# Patient Record
Sex: Male | Born: 1955
Health system: Southern US, Community
[De-identification: ages and names within clinical notes are randomized; demographics above are authoritative.]

## PROBLEM LIST (undated history)

## (undated) DIAGNOSIS — K6389 Other specified diseases of intestine: Secondary | ICD-10-CM

## (undated) DIAGNOSIS — Z789 Other specified health status: Secondary | ICD-10-CM

## (undated) DIAGNOSIS — I10 Essential (primary) hypertension: Secondary | ICD-10-CM

## (undated) DIAGNOSIS — J45909 Unspecified asthma, uncomplicated: Secondary | ICD-10-CM

## (undated) DIAGNOSIS — T4145XA Adverse effect of unspecified anesthetic, initial encounter: Secondary | ICD-10-CM

## (undated) DIAGNOSIS — I739 Peripheral vascular disease, unspecified: Secondary | ICD-10-CM

## (undated) DIAGNOSIS — J42 Unspecified chronic bronchitis: Secondary | ICD-10-CM

## (undated) DIAGNOSIS — F32A Depression, unspecified: Secondary | ICD-10-CM

## (undated) DIAGNOSIS — K5792 Diverticulitis of intestine, part unspecified, without perforation or abscess without bleeding: Secondary | ICD-10-CM

## (undated) DIAGNOSIS — F329 Major depressive disorder, single episode, unspecified: Secondary | ICD-10-CM

## (undated) DIAGNOSIS — K635 Polyp of colon: Secondary | ICD-10-CM

## (undated) DIAGNOSIS — C649 Malignant neoplasm of unspecified kidney, except renal pelvis: Secondary | ICD-10-CM

## (undated) DIAGNOSIS — N289 Disorder of kidney and ureter, unspecified: Secondary | ICD-10-CM

## (undated) DIAGNOSIS — M1990 Unspecified osteoarthritis, unspecified site: Secondary | ICD-10-CM

## (undated) DIAGNOSIS — M899 Disorder of bone, unspecified: Secondary | ICD-10-CM

## (undated) DIAGNOSIS — T8859XA Other complications of anesthesia, initial encounter: Secondary | ICD-10-CM

## (undated) DIAGNOSIS — K529 Noninfective gastroenteritis and colitis, unspecified: Secondary | ICD-10-CM

## (undated) DIAGNOSIS — IMO0001 Reserved for inherently not codable concepts without codable children: Secondary | ICD-10-CM

## (undated) DIAGNOSIS — N529 Male erectile dysfunction, unspecified: Secondary | ICD-10-CM

## (undated) DIAGNOSIS — M898X9 Other specified disorders of bone, unspecified site: Secondary | ICD-10-CM

## (undated) DIAGNOSIS — F419 Anxiety disorder, unspecified: Secondary | ICD-10-CM

## (undated) DIAGNOSIS — M359 Systemic involvement of connective tissue, unspecified: Secondary | ICD-10-CM

## (undated) DIAGNOSIS — J309 Allergic rhinitis, unspecified: Secondary | ICD-10-CM

## (undated) DIAGNOSIS — R590 Localized enlarged lymph nodes: Secondary | ICD-10-CM

## (undated) DIAGNOSIS — A6000 Herpesviral infection of urogenital system, unspecified: Secondary | ICD-10-CM

## (undated) HISTORY — DX: Allergic rhinitis, unspecified: J30.9

## (undated) HISTORY — DX: Other specified disorders of bone, unspecified site: M89.8X9

## (undated) HISTORY — DX: Malignant neoplasm of unspecified kidney, except renal pelvis: C64.9

## (undated) HISTORY — DX: Disorder of kidney and ureter, unspecified: N28.9

## (undated) HISTORY — DX: Diverticulitis of intestine, part unspecified, without perforation or abscess without bleeding: K57.92

## (undated) HISTORY — DX: Unspecified chronic bronchitis: J42

## (undated) HISTORY — DX: Localized enlarged lymph nodes: R59.0

## (undated) HISTORY — DX: Major depressive disorder, single episode, unspecified: F32.9

## (undated) HISTORY — PX: COLON SURGERY: SHX602

## (undated) HISTORY — DX: Polyp of colon: K63.5

## (undated) HISTORY — DX: Male erectile dysfunction, unspecified: N52.9

## (undated) HISTORY — DX: Anxiety disorder, unspecified: F41.9

## (undated) HISTORY — DX: Disorder of bone, unspecified: M89.9

## (undated) HISTORY — DX: Other specified diseases of intestine: K63.89

## (undated) HISTORY — DX: Herpesviral infection of urogenital system, unspecified: A60.00

## (undated) HISTORY — DX: Depression, unspecified: F32.A

## (undated) HISTORY — DX: Noninfective gastroenteritis and colitis, unspecified: K52.9

## (undated) HISTORY — DX: Unspecified osteoarthritis, unspecified site: M19.90

---
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
## 1998-02-24 HISTORY — PX: VASECTOMY: SHX75

## 2004-07-01 ENCOUNTER — Emergency Department: Payer: Self-pay | Admitting: Emergency Medicine

## 2007-10-20 ENCOUNTER — Ambulatory Visit: Payer: Self-pay | Admitting: Endocrinology

## 2008-03-26 ENCOUNTER — Emergency Department: Payer: Self-pay | Admitting: Emergency Medicine

## 2009-03-12 ENCOUNTER — Ambulatory Visit: Payer: Self-pay

## 2009-05-11 ENCOUNTER — Ambulatory Visit: Payer: Self-pay | Admitting: General Practice

## 2009-08-09 ENCOUNTER — Ambulatory Visit: Payer: Self-pay | Admitting: Unknown Physician Specialty

## 2009-08-17 ENCOUNTER — Ambulatory Visit: Payer: Self-pay | Admitting: Unknown Physician Specialty

## 2010-02-24 HISTORY — PX: KIDNEY SURGERY: SHX687

## 2010-02-24 HISTORY — PX: PARTIAL COLECTOMY: SHX5273

## 2010-04-30 ENCOUNTER — Inpatient Hospital Stay: Payer: Self-pay | Admitting: Surgery

## 2010-06-08 ENCOUNTER — Emergency Department: Payer: Self-pay | Admitting: Emergency Medicine

## 2010-06-19 ENCOUNTER — Ambulatory Visit: Payer: Self-pay | Admitting: Surgery

## 2010-06-26 ENCOUNTER — Inpatient Hospital Stay: Payer: Self-pay | Admitting: Surgery

## 2010-07-02 LAB — PATHOLOGY REPORT

## 2010-07-10 ENCOUNTER — Ambulatory Visit: Payer: Self-pay | Admitting: Internal Medicine

## 2010-07-26 ENCOUNTER — Ambulatory Visit: Payer: Self-pay | Admitting: Internal Medicine

## 2010-10-03 ENCOUNTER — Ambulatory Visit: Payer: Self-pay | Admitting: Urology

## 2011-05-13 ENCOUNTER — Emergency Department: Payer: Self-pay | Admitting: Emergency Medicine

## 2011-05-13 LAB — CBC
HCT: 41.8 % (ref 40.0–52.0)
HGB: 13.8 g/dL (ref 13.0–18.0)
MCH: 30.9 pg (ref 26.0–34.0)
MCV: 94 fL (ref 80–100)
RBC: 4.45 10*6/uL (ref 4.40–5.90)
WBC: 9.9 10*3/uL (ref 3.8–10.6)

## 2011-05-13 LAB — COMPREHENSIVE METABOLIC PANEL
BUN: 19 mg/dL — ABNORMAL HIGH (ref 7–18)
Bilirubin,Total: 0.3 mg/dL (ref 0.2–1.0)
Calcium, Total: 9 mg/dL (ref 8.5–10.1)
Chloride: 104 mmol/L (ref 98–107)
Co2: 26 mmol/L (ref 21–32)
EGFR (African American): >60
EGFR (Non-African Amer.): >60
Glucose: 81 mg/dL (ref 65–99)
Osmolality: 284 (ref 275–301)
Potassium: 4 mmol/L (ref 3.5–5.1)
SGPT (ALT): 18 U/L
Total Protein: 7.5 g/dL (ref 6.4–8.2)

## 2011-05-13 LAB — URINALYSIS, COMPLETE
Bilirubin,UR: NEGATIVE
Ph: 6 (ref 4.5–8.0)
RBC,UR: 1 /HPF (ref 0–5)
Squamous Epithelial: NONE SEEN
WBC UR: 1 /HPF (ref 0–5)

## 2011-05-13 LAB — PROTIME-INR
INR: 1
Prothrombin Time: 13.6 secs (ref 11.5–14.7)

## 2011-05-13 LAB — AMYLASE: Amylase: 46 U/L (ref 25–115)

## 2012-12-06 ENCOUNTER — Inpatient Hospital Stay: Payer: Self-pay | Admitting: Internal Medicine

## 2012-12-06 LAB — URINALYSIS, COMPLETE
Bacteria: NONE SEEN
Bilirubin,UR: NEGATIVE
Ketone: NEGATIVE
Leukocyte Esterase: NEGATIVE
Nitrite: NEGATIVE
Ph: 6 (ref 4.5–8.0)
Protein: 100
Specific Gravity: 1.026 (ref 1.003–1.030)
WBC UR: 2 /HPF (ref 0–5)

## 2012-12-06 LAB — CK: CK, Total: 93 U/L (ref 35–232)

## 2012-12-06 LAB — COMPREHENSIVE METABOLIC PANEL
Albumin: 3.5 g/dL (ref 3.4–5.0)
Alkaline Phosphatase: 73 U/L (ref 50–136)
BUN: 12 mg/dL (ref 7–18)
Chloride: 103 mmol/L (ref 98–107)
Creatinine: 1.24 mg/dL (ref 0.60–1.30)
Osmolality: 272 (ref 275–301)
Potassium: 3.8 mmol/L (ref 3.5–5.1)
SGPT (ALT): 15 U/L (ref 12–78)
Total Protein: 6.9 g/dL (ref 6.4–8.2)

## 2012-12-06 LAB — CBC
MCH: 32.8 pg (ref 26.0–34.0)
MCHC: 33.8 g/dL (ref 32.0–36.0)
MCV: 97 fL (ref 80–100)
Platelet: 244 10*3/uL (ref 150–440)
RBC: 4.81 10*6/uL (ref 4.40–5.90)
RDW: 14.6 % — ABNORMAL HIGH (ref 11.5–14.5)
WBC: 16.2 10*3/uL — ABNORMAL HIGH (ref 3.8–10.6)

## 2012-12-06 LAB — LIPASE, BLOOD: Lipase: 135 U/L (ref 73–393)

## 2012-12-06 LAB — CLOSTRIDIUM DIFFICILE BY PCR

## 2012-12-07 LAB — CBC WITH DIFFERENTIAL/PLATELET
Basophil #: 0.1 10*3/uL (ref 0.0–0.1)
Eosinophil #: 0.3 10*3/uL (ref 0.0–0.7)
Eosinophil %: 2 %
Lymphocyte #: 2.1 10*3/uL (ref 1.0–3.6)
MCH: 33.1 pg (ref 26.0–34.0)
MCV: 99 fL (ref 80–100)
Monocyte %: 7.7 %
Neutrophil #: 11.4 10*3/uL — ABNORMAL HIGH (ref 1.4–6.5)
Neutrophil %: 76.1 %
RDW: 14.6 % — ABNORMAL HIGH (ref 11.5–14.5)
WBC: 14.9 10*3/uL — ABNORMAL HIGH (ref 3.8–10.6)

## 2012-12-08 LAB — BASIC METABOLIC PANEL
BUN: 6 mg/dL — ABNORMAL LOW (ref 7–18)
Chloride: 106 mmol/L (ref 98–107)
Co2: 24 mmol/L (ref 21–32)
Creatinine: 1.11 mg/dL (ref 0.60–1.30)
EGFR (African American): >60
Osmolality: 270 (ref 275–301)
Sodium: 137 mmol/L (ref 136–145)

## 2012-12-08 LAB — CBC WITH DIFFERENTIAL/PLATELET
Basophil #: 0.1 10*3/uL (ref 0.0–0.1)
Basophil %: 0.9 %
Eosinophil #: 0.3 10*3/uL (ref 0.0–0.7)
HCT: 39.2 % — ABNORMAL LOW (ref 40.0–52.0)
MCHC: 33.6 g/dL (ref 32.0–36.0)
MCV: 98 fL (ref 80–100)
Neutrophil %: 66.6 %
Platelet: 189 10*3/uL (ref 150–440)
RDW: 14.1 % (ref 11.5–14.5)

## 2014-06-16 NOTE — Discharge Summary (Signed)
PATIENT NAME:  Thomas Le, VANDERWALL MR#:  [id number] DATE OF BIRTH:  [birth year]  DATE OF ADMISSION:  12/06/2012 DATE OF DISCHARGE:  12/08/2012   ADMITTING PHYSICIAN: Srikar R. Sudini, MD  DISCHARGING PHYSICIAN: Gladstone Lighter, MD  PRIMARY CARE PHYSICIAN: None.   CHIEF COMPLAINT: Abdominal pain and bloody diarrhea.   DISCHARGE DIAGNOSES:  1. Acute colitis involving the transverse colon and descending colon.  2. Tobacco use disorder.  3. History of perforated diverticulitis, status post colon resection.  4. History of renal cell carcinoma, status post right nephrectomy.   DISCHARGE HOME MEDICATIONS:  1. Flagyl 500 mg p.o. q.8 hours for 8 more days.  2. Ciprofloxacin 500 mg p.o. b.i.d. for 8 more days.  3. Percocet 10/325 mg 1 tablet q.6 hours p.r.n. for pain.   DISCHARGE DIET: Regular and low-residue diet.   DISCHARGE ACTIVITY: As tolerated.   FOLLOWUP INSTRUCTIONS: Follow up with physician in 2 weeks.   LABS AND IMAGING STUDIES PRIOR TO DISCHARGE:  WBC is 10.3, hemoglobin 13.3, hematocrit 39.2, platelet count 189. Sodium 137, potassium 4.0, chloride 106, bicarbonate 24, BUN 6, creatinine 1.1, glucose 76 and calcium of 8.1.  Stool for C. difficile is negative.  CT of the abdomen and pelvis with contrast showing transverse and descending colon findings consistent with colitis, could be infectious or inflammatory. No perforation or abscess noted. No evidence of obstruction. Multiple liver cysts are seen. Right kidney is surgically absent, and left kidney is normal. Left adrenal gland is enlarged, and there is a small right adrenal mass. No evidence of any ascites seen.  Chest x-ray revealing clear lung fields. No acute abnormality.  Urinalysis negative for any infection.  Lipase was normal on admission.   BRIEF HOSPITAL COURSE: Mr. Schara is a 59 year old male with no significant past medical history other than history of perforated diverticulitis, for which he had a partial colon  resection, and at the same time, incidentally was noted to have a right renal mass consistent with possible renal cell carcinoma and had right nephrectomy done during the same procedure. The patient has episodes of chronic diarrhea occasionally since his surgery; however, he started noticing intense lower abdominal pain and bloody diarrhea and presented to the ER. CT of the abdomen here revealed colitis of the transverse and descending colon, so was admitted for the same.   1. Acute colitis. Was initially n.p.o., and then his diet was slowly advanced to the liquids. He did receive IV pain medication and was on IV fluids as well. Started on Cipro and Flagyl. His bloody diarrhea has resolved, and currently he only has a few episodes of regular chronic diarrhea. Stool studies have been negative for C. difficile infection. He was tolerating his diet, so was advanced to a regular diet and is being discharged home on p.o. Cipro and Flagyl antibiotics.  2. Tobacco use disorder. Was counseled on admission. 3. His course has been otherwise uneventful in the hospital.   DISCHARGE CONDITION: Stable.   DISCHARGE DISPOSITION: Home.   TIME SPENT ON DISCHARGE: 45 minutes.   ____________________________ Gladstone Lighter, MD rk:lb D: 12/08/2012 07:56:42 ET T: 12/08/2012 08:07:02 ET JOB#: [id number]  cc: Gladstone Lighter, MD, <Dictator> Gladstone Lighter MD ELECTRONICALLY SIGNED 12/11/2012 12:07

## 2014-06-16 NOTE — H&P (Signed)
PATIENT NAME:  Thomas, Le MR#:  [id number] DATE OF BIRTH:  [birth year]  DATE OF ADMISSION:  12/06/2012  PRIMARY CARE PHYSICIAN: None.   CHIEF COMPLAINT: Abdominal pain and bloody diarrhea.   HISTORY OF PRESENTING ILLNESS: A 59 year old male patient with history of perforated diverticulitis, status post colon resection and renal cell carcinoma, who presents to the Emergency Room complaining of 3 days of nausea, vomiting, abdominal pain and bloody diarrhea. The patient had symptoms and started feeling weak with some abdominal discomfort, nausea, vomiting and diarrhea. The patient later noticed gross bloody stools. The last bowel movement he had was earlier today, which had some clots. His nausea and vomiting had resolved, but he has received multiple doses of pain medications secondary to his abdominal pain. The patient is afebrile. White count is elevated at 16. CT scan of the abdomen showing transverse and descending colon colitis and the patient is being admitted to the hospitalist service.   The patient did not use any recent antibiotics or steroids. No history of inflammatory bowel disease. The patient, in 2012, was having a screening colonoscopy and a different colonoscopy after one month of this procedure and the patient had a perforated diverticulitis and had to have surgery.   He was also diagnosed with renal cell carcinoma. Presently, he is being followed by imaging studies after nephrectomy during the same surgery as colon cancer.   PAST MEDICAL HISTORY:  1.  Perforated diverticulitis, status post colon resection.  2.  Renal cell carcinoma., status post nephrectomy.   FAMILY HISTORY: No family history of colon cancer, renal cell carcinoma or inflammatory bowel disease.   SOCIAL HISTORY: The patient smokes a pack a day. Drinks 4 to 5 days out of the week, but does not have any withdrawal symptoms.   ALLERGIES: No known drug allergies.   HOME MEDICATIONS: Ibuprofen p.r.n. maybe once  a week.   REVIEW OF SYSTEMS: CONSTITUTIONAL: No fever. Does have fatigue. EYES: No blurred vision, pain, redness. EARS, NOSE, THROAT: No tinnitus, ear pain, hearing loss.  RESPIRATORY: No cough, wheeze, hemoptysis. CARDIOVASCULAR: No chest pain, orthopnea, edema. GASTROINTESTINAL: Has nausea, vomiting and diarrhea, which is bloody.  GENITOURINARY: No dysuria or hematuria. Does have decreased urination. ENDOCRINE: No polyuria, nocturia, thyroid problems. HEMATOLOGIC AND LYMPHATIC: No anemia, easy bruising or bleeding. INTEGUMENTARY: No acne, rash, lesions. MUSCULOSKELETAL: No myalgias or arthralgias. NEUROLOGIC: No focal numbness, weakness, seizure. PSYCHIATRIC: No anxiety or depression.   PHYSICAL EXAMINATION:  VITAL SIGNS: Temperature 97.7, pulse 61, blood pressure 142/63, saturating 97% on room.  GENERAL: Moderately built, Caucasian male patient lying in bed in mild distress secondary to his abdominal pain.  PSYCHIATRIC: Alert and oriented x 3. Mood and affect appropriate. Judgment intact.  HEENT: Atraumatic, normocephalic. Oral mucosa dry and pink. No oral ulcers or thrush. External ears and nose normal. No pallor. No icterus.  NECK: Supple. No thyromegaly. No palpable lymph nodes. Trachea midline. No carotid bruit or JVD.  CARDIOVASCULAR: S1, S2, without any murmurs. Peripheral pulses 2+. No edema.  RESPIRATORY: Normal work of breathing. Clear to auscultation on both sides.  GASTROINTESTINAL: Soft abdomen. Tenderness diffusely. No rigidity or guarding. Scars from prior surgeries seen. No hepatosplenomegaly palpable.  GENITOURINARY: No CVA tenderness or bladder distention.  SKIN: Warm and dry. No petechiae, rash, ulcers.  MUSCULOSKELETAL: No joint swelling, redness, effusion of the large joints. Normal muscle tone.  NEUROLOGICAL: Motor strength 5 over 5 in upper and lower extremities.  LYMPHATIC: No cervical lymphadenopathy.   LABORATORY STUDIES: Glucose of  103, BUN 12, creatinine 1.24,  sodium 136, potassium 3.8. AST, ALT, alkaline phosphatase, bilirubin normal. Troponin less than 0.02. WBC 16.2, hemoglobin 15.8, MCV of 97 with platelets 244. Urinalysis shows no bacteria. EKG shows sinus bradycardia. No acute ST-T wave changes. CT scan of the abdomen and pelvis with contrast shows colitis of transverse and descending colon. No evidence of perforation or abscess, liver cyst, right kidney surgically absent. Left adrenal enlargement, same as prior CT from 2013.   ASSESSMENT AND PLAN:  1.  Acute colitis involving both the transverse and descending colon. The patient has had bloody diarrhea. This is likely secondary to the colitis. The patient will be started on IV antibiotics of Cipro and Flagyl. Clear liquid diet. Use pain medications as needed. The patient has had complications with colonoscopy in the past. We will not put the patient through the colonoscopy again for followup. We will have to see how the patient does on IV antibiotics. The patient has significant dehydration. We will also aggressively IV fluid resuscitate him. I expect his hemoglobin to drop with IV fluids. He likely has hemoconcentration showing a normal hemoglobin at this time.  2.  Elevated blood pressure without diagnosis of hypertension, likely secondary from his abdominal pain. No medications needed at this time.  3.  Tobacco abuse. I have counseled the patient to quit smoking for greater than 3 minutes.  4.  Deep vein thrombosis prophylaxis with sequential compression devices. No heparin products.   CODE STATUS: Full code.  TIME SPENT TODAY ON THIS CASE: 50 minutes.   ____________________________ Molinda Bailiff Audyn Dimercurio, MD srs:aw D: 12/06/2012 14:49:22 ET T: 12/06/2012 15:02:39 ET JOB#: [id number]  cc: Wardell Heath R. Taj Nevins, MD, <Dictator> Orie Fisherman MD ELECTRONICALLY SIGNED 12/19/2012 22:40

## 2015-05-14 ENCOUNTER — Ambulatory Visit (INDEPENDENT_AMBULATORY_CARE_PROVIDER_SITE_OTHER): Payer: BLUE CROSS/BLUE SHIELD | Admitting: Family Medicine

## 2015-05-14 ENCOUNTER — Encounter: Payer: Self-pay | Admitting: Family Medicine

## 2015-05-14 VITALS — BP 104/62 | HR 74 | Temp 98.3°F | Ht 74.0 in | Wt 166.6 lb

## 2015-05-14 DIAGNOSIS — F329 Major depressive disorder, single episode, unspecified: Secondary | ICD-10-CM | POA: Insufficient documentation

## 2015-05-14 DIAGNOSIS — R197 Diarrhea, unspecified: Secondary | ICD-10-CM | POA: Insufficient documentation

## 2015-05-14 DIAGNOSIS — M79641 Pain in right hand: Secondary | ICD-10-CM | POA: Insufficient documentation

## 2015-05-14 DIAGNOSIS — M25449 Effusion, unspecified hand: Secondary | ICD-10-CM | POA: Diagnosis not present

## 2015-05-14 DIAGNOSIS — Z130 Encounter for screening for diseases of the blood and blood-forming organs and certain disorders involving the immune mechanism: Secondary | ICD-10-CM

## 2015-05-14 DIAGNOSIS — M79642 Pain in left hand: Secondary | ICD-10-CM

## 2015-05-14 DIAGNOSIS — F32A Depression, unspecified: Secondary | ICD-10-CM | POA: Insufficient documentation

## 2015-05-14 DIAGNOSIS — F418 Other specified anxiety disorders: Secondary | ICD-10-CM | POA: Diagnosis not present

## 2015-05-14 DIAGNOSIS — I731 Thromboangiitis obliterans [Buerger's disease]: Secondary | ICD-10-CM

## 2015-05-14 DIAGNOSIS — Z13220 Encounter for screening for lipoid disorders: Secondary | ICD-10-CM

## 2015-05-14 DIAGNOSIS — M349 Systemic sclerosis, unspecified: Secondary | ICD-10-CM | POA: Insufficient documentation

## 2015-05-14 DIAGNOSIS — F419 Anxiety disorder, unspecified: Secondary | ICD-10-CM

## 2015-05-14 NOTE — Patient Instructions (Signed)
Nice to meet you. We will obtain lab work at a later date when you been fasting to evaluate screening labs and for arthritis. Please consider BuSpar for your anxiety. You need to quit smoking at this time to help with your hands. If you develop thoughts of harming herself, and pain, numbness or tingling in her hands, coldness in her hands, or any new or changing symptoms please seek medical attention.

## 2015-05-14 NOTE — Assessment & Plan Note (Signed)
Untreated at this time. Patient complains rather significantly of this issue though his PHQ 9 and GAD 7 scoring are quite low. He is inappropriately taking his wife's Xanax intermittently at this time. Discussed that he should not do this. Discussed possible treatments including SSRIs which he declined and BuSpar. He stated he would check with his wife and discuss BuSpar with her prior to determining if he would start it. He will continue to monitor. If worsens he will let us know. He is given return precautions.

## 2015-05-14 NOTE — Progress Notes (Signed)
Patient ID: [name], male   DOB: 05-23-1955, 60 y.o.   MRN: [id number]  [name], MD Phone: [phone]  Thomas Le is a 60 y.o. male who presents today for new patient visit.  Depression/anxiety: Patient notes significant stress at home with his daughter being pregnant and his son being depressed. Patient notes he's had issues with depression and anxiety off and on since he was a child. Had lots of significant life changes at once while he was a kid. Notes he has never really enjoyed going to school or work. Has had issues with sleeping throughout his life. 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 Notes he is currently intermittently taking his wife's Xanax if he has a particularly bad day. No SI.  Patient additionally reports concerns for arthritis in his hands. He has a family history rheumatoid arthritis. He notes MCP joint swelling particularly in the second third and fourth MCP joints.  He also reports he has buergers disease. He was diagnosed at [facility] in 1999. States this is related to decreased blood flow to his fingers due to smoking. He continues to smoke. Notes occasionally his fingers will turn blue and become mildly cold though this will resolve if he warms them up.  Patient additionally notes intermittent diarrhea depending on what he eats since he had colon surgery for colon perforation. Notes if he eats spicy foods or drinks beer he will have diarrhea. Typically takes Imodium and this is beneficial.  Active Ambulatory Problems    Diagnosis Date Noted  . Anxiety and depression 05/14/2015  . Bilateral hand pain 05/14/2015  . Buerger's disease (HCC) 05/14/2015  . Diarrhea 05/14/2015   Resolved Ambulatory Problems    Diagnosis Date Noted  . No Resolved Ambulatory Problems   Past Medical History  Diagnosis Date  . Arthritis   . Colitis   . Renal cancer (HCC)   . Diverticulitis   . Chronic bronchitis  (HCC)   . Allergic rhinitis   . Kidney disease   . Genital herpes   . Colon polyps     Family History  Problem Relation Age of Onset  . Arthritis/Rheumatoid      Parent  . Heart disease      Grandparent  . Alcoholism      Other relative    Social History   Social History  . Marital Status: Married    Spouse Name: N/A  . Number of Children: N/A  . Years of Education: N/A   Occupational History  . Not on file.   Social History Main Topics  . Smoking status: Current Every Day Smoker  . Smokeless tobacco: Not on file  . Alcohol Use: 0.0 oz/week    0 Standard drinks or equivalent per week     Comment: 4 beers a night   . Drug Use: Yes  . Sexual Activity: Not on file   Other Topics Concern  . Not on file   Social History Narrative  . No narrative on file    ROS   General:  Negative for nexplained weight loss, fever Skin: Negative for new or changing mole, sore that won't heal HEENT: Negative for trouble hearing, trouble seeing, ringing in ears, mouth sores, hoarseness, change in voice, dysphagia. CV:  Negative for chest pain, dyspnea, edema, palpitations Resp: Negative for cough, dyspnea, hemoptysis GI: Positive for diarrhea, Negative for nausea, vomiting, constipation, abdominal pain, melena, hematochezia. GU: Positive for sexual difficulty,  Negative for dysuria, incontinence, urinary hesitance, hematuria, vaginal or penile discharge, polyuria, lumps in testicle or breasts MSK: Negative for muscle cramps or aches, joint pain or swelling Neuro: Negative for headaches, weakness, numbness, dizziness, passing out/fainting Psych: Positive for depression, anxiety, negative for memory problems  Objective  Physical Exam Filed Vitals:   05/14/15 1422  BP: 104/62  Pulse: 74  Temp: 98.3 F (36.8 C)    BP Readings from Last 3 Encounters:  05/14/15 104/62   Wt Readings from Last 3 Encounters:  05/14/15 166 lb 9.6 oz (75.569 kg)    Physical Exam    Constitutional: He is well-developed, well-nourished, and in no distress.  HENT:  Head: Normocephalic and atraumatic.  Right Ear: External ear normal.  Left Ear: External ear normal.  Mouth/Throat: Oropharynx is clear and moist. No oropharyngeal exudate.  Eyes: Pupils are equal, round, and reactive to light.  Neck: Neck supple.  Cardiovascular: Normal rate, regular rhythm and normal heart sounds.  Exam reveals no gallop and no friction rub.   No murmur heard. Pulmonary/Chest: Effort normal and breath sounds normal. No respiratory distress. He has no wheezes. He has no rales.  Abdominal: Soft. Bowel sounds are normal. He exhibits no distension. There is no tenderness. There is no rebound and no guarding.  Musculoskeletal: He exhibits no edema.  Bilateral hands with swelling of the second third and fourth MCP joints with no erythema or warmth, no joint tenderness in the hands  Lymphadenopathy:    He has no cervical adenopathy.  Neurological: He is alert.  5/5 strength in bilateral biceps, triceps, grip, quads, hamstrings, plantar and dorsiflexion, sensation to light touch intact in bilateral UE and LE, normal gait, 2+ patellar reflexes  Skin: Skin is dry. He is not diaphoretic.  Bilateral fingers are cold though have capillary refill less than 2 seconds, on second and third check of hands throughout the exam there are varying degrees of increased warmth, mild purplish you to his fingers, 2+ radial pulses bilaterally, no skin ulcerations on the hands, sensation to light touch intact in bilateral hands, bilateral feet are warm and of normal color with 2+ DP pulses, sensation to light touch intact in bilateral feet  Psychiatric:  Mood anxious, affect anxious     Assessment/Plan:   Anxiety and depression Untreated at this time. Patient complains rather significantly of this issue though his PHQ 9 and GAD 7 scoring are quite low. He is inappropriately taking his wife's Xanax intermittently at  this time. Discussed that he should not do this. Discussed possible treatments including SSRIs which he declined and BuSpar. He stated he would check with his wife and discuss BuSpar with her prior to determining if he would start it. He will continue to monitor. If worsens he will let us know. He is given return precautions.  Bilateral hand pain Patient with MCP joint swelling in his bilateral hands with a family history of rheumatoid arthritis. We will check a rheumatoid factor and ANA to start workup for this. He'll continue to monitor.  Buerger's disease [facility]) Patient reports is a chronic issue since 1999. No recent change. His fingers do appear cold with a slight purplish tinge, though he has good capillary refill and no skin breakdown at this time. Distal pulses are intact as well. Given his history and his continued smoking I advised him to quit smoking. I also offered referral to vascular surgery for evaluation and possible further treatment though he was unsure of this and stated he needed  to check with his wife. He will continue to monitor. He is given return precautions.  Diarrhea Chronic intermittent infrequent issue based off of specific food intake. Benign abdominal exam. Advised to avoid foods that bring this on. Can continue Imodium. Given return precautions.    Orders Placed This Encounter  Procedures  . Lipid Profile    Standing Status: Future     Number of Occurrences:      Standing Expiration Date: 05/13/2016  . Comp Met (CMET)    Standing Status: Future     Number of Occurrences:      Standing Expiration Date: 05/13/2016  . CBC    Standing Status: Future     Number of Occurrences:      Standing Expiration Date: 05/13/2016  . Rheumatoid Factor    Standing Status: Future     Number of Occurrences:      Standing Expiration Date: 05/13/2016  . Antinuclear Antib (ANA)    Standing Status: Future     Number of Occurrences:      Standing Expiration Date: 05/13/2016  Patient  will also return for screening lab work.   [name], MD [facility]

## 2015-05-14 NOTE — Assessment & Plan Note (Signed)
Patient with MCP joint swelling in his bilateral hands with a family history of rheumatoid arthritis. We will check a rheumatoid factor and ANA to start workup for this. He'll continue to monitor.

## 2015-05-14 NOTE — Assessment & Plan Note (Signed)
Patient reports is a chronic issue since 1999. No recent change. His fingers do appear cold with a slight purplish tinge, though he has good capillary refill and no skin breakdown at this time. Distal pulses are intact as well. Given his history and his continued smoking I advised him to quit smoking. I also offered referral to vascular surgery for evaluation and possible further treatment though he was unsure of this and stated he needed to check with his wife. He will continue to monitor. He is given return precautions.

## 2015-05-14 NOTE — Progress Notes (Signed)
Pre visit review using our clinic review tool, if applicable. No additional management support is needed unless otherwise documented below in the visit note. 

## 2015-05-14 NOTE — Assessment & Plan Note (Signed)
Chronic intermittent infrequent issue based off of specific food intake. Benign abdominal exam. Advised to avoid foods that bring this on. Can continue Imodium. Given return precautions.

## 2015-06-12 ENCOUNTER — Other Ambulatory Visit (INDEPENDENT_AMBULATORY_CARE_PROVIDER_SITE_OTHER): Payer: BLUE CROSS/BLUE SHIELD

## 2015-06-12 DIAGNOSIS — Z130 Encounter for screening for diseases of the blood and blood-forming organs and certain disorders involving the immune mechanism: Secondary | ICD-10-CM

## 2015-06-12 DIAGNOSIS — Z13220 Encounter for screening for lipoid disorders: Secondary | ICD-10-CM

## 2015-06-12 DIAGNOSIS — M25449 Effusion, unspecified hand: Secondary | ICD-10-CM

## 2015-06-12 LAB — LIPID PANEL
CHOLESTEROL: 147 mg/dL (ref 0–200)
HDL: 62.8 mg/dL (ref >39.00)
LDL Cholesterol: 71 mg/dL (ref 0–99)
NonHDL: 84.09
Total CHOL/HDL Ratio: 2
Triglycerides: 64 mg/dL (ref 0.0–149.0)
VLDL: 12.8 mg/dL (ref 0.0–40.0)

## 2015-06-12 LAB — CBC
HCT: 46.3 % (ref 39.0–52.0)
Hemoglobin: 15.5 g/dL (ref 13.0–17.0)
MCHC: 33.5 g/dL (ref 30.0–36.0)
MCV: 98.6 fl (ref 78.0–100.0)
Platelets: 267 10*3/uL (ref 150.0–400.0)
RBC: 4.7 Mil/uL (ref 4.22–5.81)
RDW: 14.4 % (ref 11.5–15.5)
WBC: 8.7 10*3/uL (ref 4.0–10.5)

## 2015-06-12 LAB — COMPREHENSIVE METABOLIC PANEL
ALBUMIN: 4.1 g/dL (ref 3.5–5.2)
ALK PHOS: 56 U/L (ref 39–117)
ALT: 14 U/L (ref 0–53)
AST: 24 U/L (ref 0–37)
BILIRUBIN TOTAL: 0.7 mg/dL (ref 0.2–1.2)
BUN: 17 mg/dL (ref 6–23)
CO2: 28 mEq/L (ref 19–32)
CREATININE: 1.2 mg/dL (ref 0.40–1.50)
Calcium: 9.5 mg/dL (ref 8.4–10.5)
Chloride: 103 mEq/L (ref 96–112)
GFR: 65.43 mL/min (ref >60.00)
Glucose, Bld: 91 mg/dL (ref 70–99)
Potassium: 4.7 mEq/L (ref 3.5–5.1)
SODIUM: 137 meq/L (ref 135–145)
TOTAL PROTEIN: 7.1 g/dL (ref 6.0–8.3)

## 2015-06-13 LAB — ANTI-NUCLEAR AB-TITER (ANA TITER): ANA Titer 1: 1:1280 {titer} — ABNORMAL HIGH

## 2015-06-13 LAB — RHEUMATOID FACTOR: RHEUMATOID FACTOR: 17 [IU]/mL — AB (ref <=14)

## 2015-06-13 LAB — ANA: ANA: POSITIVE — AB

## 2015-06-14 ENCOUNTER — Encounter: Payer: Self-pay | Admitting: Family Medicine

## 2015-06-14 ENCOUNTER — Ambulatory Visit (INDEPENDENT_AMBULATORY_CARE_PROVIDER_SITE_OTHER): Payer: BLUE CROSS/BLUE SHIELD | Admitting: Family Medicine

## 2015-06-14 VITALS — BP 104/60 | HR 58 | Temp 98.3°F | Ht 74.0 in | Wt 162.6 lb

## 2015-06-14 DIAGNOSIS — M79642 Pain in left hand: Secondary | ICD-10-CM

## 2015-06-14 DIAGNOSIS — R599 Enlarged lymph nodes, unspecified: Secondary | ICD-10-CM | POA: Diagnosis not present

## 2015-06-14 DIAGNOSIS — R590 Localized enlarged lymph nodes: Secondary | ICD-10-CM | POA: Insufficient documentation

## 2015-06-14 DIAGNOSIS — F32A Depression, unspecified: Secondary | ICD-10-CM

## 2015-06-14 DIAGNOSIS — R768 Other specified abnormal immunological findings in serum: Secondary | ICD-10-CM

## 2015-06-14 DIAGNOSIS — F418 Other specified anxiety disorders: Secondary | ICD-10-CM | POA: Diagnosis not present

## 2015-06-14 DIAGNOSIS — M79641 Pain in right hand: Secondary | ICD-10-CM | POA: Diagnosis not present

## 2015-06-14 DIAGNOSIS — F419 Anxiety disorder, unspecified: Secondary | ICD-10-CM

## 2015-06-14 DIAGNOSIS — N529 Male erectile dysfunction, unspecified: Secondary | ICD-10-CM | POA: Insufficient documentation

## 2015-06-14 DIAGNOSIS — F329 Major depressive disorder, single episode, unspecified: Secondary | ICD-10-CM

## 2015-06-14 MED ORDER — SILDENAFIL CITRATE 50 MG PO TABS: 50.0000 mg | ORAL_TABLET | Freq: Every day | ORAL | Status: DC | PRN

## 2015-06-14 NOTE — Assessment & Plan Note (Signed)
Positive ANA with significantly elevated titer. Also with mildly elevated rheumatoid factor. Given joint swelling and lab findings we'll refer to rheumatology for further evaluation and workup for possible lupus and rheumatoid arthritis.

## 2015-06-14 NOTE — Assessment & Plan Note (Signed)
Chronic lesion, nontender, no signs of infection on his face or head. Given chronicity we will refer to ENT for further evaluation and consideration of biopsy.

## 2015-06-14 NOTE — Assessment & Plan Note (Signed)
Much improved. No SI or HI. Has not started BuSpar. He will continue to monitor. He will let us know if he decides to fill this medication. Given return precautions.

## 2015-06-14 NOTE — Progress Notes (Signed)
Pre visit review using our clinic review tool, if applicable. No additional management support is needed unless otherwise documented below in the visit note. 

## 2015-06-14 NOTE — Patient Instructions (Addendum)
Nice to see you. We are going to refer you to rheumatology for evaluation of her joint pain. Please consider medication for anxiety and depression and if you decide to do this please let us know. Please work on diet and exercise to reduce her risk of heart disease and stroke. One chair ready to quit smoking please let us know as well. Please try the Viagra for erectile dysfunction. If you develop chest pain, shortness of breath, or lightheadedness please seek medical attention. If he have to go to the emergency room or call EMS for any reason and you have taken the Viagra the last several days please let them know. We will refer you to ear nose and throat for the lump on the back of your head.  If you develop chest pain, shortness of breath, thoughts of harming herself or others, or any new or changing symptoms please seek medical attention.  Diet Recommendations  Starchy (carb) foods: Bread, rice, pasta, potatoes, corn, cereal, grits, crackers, bagels, muffins, all baked goods.  (Fruits, milk, and yogurt also have carbohydrate, but most of these foods will not spike your blood sugar as the starchy foods will.)  A few fruits do cause high blood sugars; use small portions of bananas (limit to 1/2 at a time), grapes, watermelon, oranges, and most tropical fruits.    Protein foods: Meat, fish, poultry, eggs, dairy foods, and beans such as pinto and kidney beans (beans also provide carbohydrate).   1. Eat at least 3 meals and 1-2 snacks per day. Never go more than 4-5 hours while awake without eating. Eat breakfast within the first hour of getting up.   2. Limit starchy foods to TWO per meal and ONE per snack. ONE portion of a starchy  food is equal to the following:   - ONE slice of bread (or its equivalent, such as half of a hamburger bun).   - 1/2 cup of a "scoopable" starchy food such as potatoes or rice.   - 15 grams of carbohydrate as shown on food label.  3. Include at every meal: a protein  food, a carb food, and vegetables and/or fruit.   - Obtain twice the volume of veg's as protein or carbohydrate foods for both lunch and dinner.   - Fresh or frozen veg's are best.   - Keep frozen veg's on hand for a quick vegetable serving.

## 2015-06-14 NOTE — Assessment & Plan Note (Signed)
Normal genital exam. No cardiac history and no cardiac symptoms. We will trial Viagra for this issue. Advised to inform any medical professional that he has taken this if he seeks emergent medical attention. Advised of potential for lightheadedness. Advised to seek medical attention for prolonged corrections.

## 2015-06-14 NOTE — Progress Notes (Signed)
Patient ID: [name], male   DOB: 01/03/1956, 60 y.o.   MRN: [id number]  Thomas Alar, MD Phone: [phone]  Thomas Le is a 60 y.o. male who presents today for same-day visit.  Joint pain: Patient notes this is unchanged from previously. Does note some swelling in the second third and fourth MCPs. He had positive ANA with quite elevated titer. Also mildly positive rheumatoid factor.  Anxiety and depression: Notes this is much improved. His housing situation is improved as his daughter and her boyfriend have moved out of the house. He notes no symptoms at this time. No SI or HI.  Erectile dysfunction: Patient notes he had no issues until 4-5 years ago. Notes he was placed on Cialis at that time and notes it worked too well. He would obtain a full erection for several hours though have a semierect penis for several days afterwards. Notes currently corrections are not as strong as typical. Ejaculates okay. No cardiac history. No chest pain or shortness of breath. He can physically do whatever he wants.  Discussed cardiac and stroke risk factor reduction given his ASCVD score of 6.3%. Discussed potential for treating with a cholesterol medicine versus altering diet and exercise. Also discussed quitting smoking. Patient does note he eats at home and does not eat out. He gets vegetables and fruits with most meals. No junk food. His job is quite physical though he does not do any specific exercise. He is unsure about quitting smoking at this time.  Patient also notes a nodule on the left occiput of his head that is been there for years. Notes it has not grown. There is no pain. It is freely mobile. He has no other lumps in his body.  PMH: Smoker   ROS see history of present illness  Objective  Physical Exam Filed Vitals:   06/14/15 0952  BP: 104/60  Pulse: 58  Temp: 98.3 F (36.8 C)    BP Readings from Last 3 Encounters:  06/14/15 104/60  05/14/15 104/62   Wt Readings  from Last 3 Encounters:  06/14/15 162 lb 9.6 oz (73.755 kg)  05/14/15 166 lb 9.6 oz (75.569 kg)    Physical Exam  Constitutional: He is well-developed, well-nourished, and in no distress.  HENT:  Head: Normocephalic and atraumatic.  Right Ear: External ear normal.  Left Ear: External ear normal.  Mouth/Throat: Oropharynx is clear and moist. No oropharyngeal exudate.  Normal TMs bilaterally  Cardiovascular: Normal rate, regular rhythm and normal heart sounds.   Pulmonary/Chest: Effort normal and breath sounds normal.  Genitourinary: Penis normal.  Normal scrotum and normal testicles, no penile discharge, no inguinal hernias, no inguinal lymphadenopathy  Musculoskeletal:  Bilateral hands with enlarged second third and fourth MCP joints with no tenderness, no other joint enlargement or tenderness in his hands  Neurological: He is alert.  Skin: Skin is warm and dry. He is not diaphoretic.  Left posterior occipital nodule about 1.5 cm in diameter freely mobile and nontender  Psychiatric: Mood and affect normal.     Assessment/Plan: Please see individual problem list.  Anxiety and depression Much improved. No SI or HI. Has not started BuSpar. He will continue to monitor. He will let us know if he decides to fill this medication. Given return precautions.  Bilateral hand pain Positive ANA with significantly elevated titer. Also with mildly elevated rheumatoid factor. Given joint swelling and lab findings we'll refer to rheumatology for further evaluation and workup for possible lupus and rheumatoid arthritis.  Erectile dysfunction Normal genital exam. No cardiac history and no cardiac symptoms. We will trial Viagra for this issue. Advised to inform any medical professional that he has taken this if he seeks emergent medical attention. Advised of potential for lightheadedness. Advised to seek medical attention for prolonged corrections.  Occipital lymphadenopathy Chronic lesion,  nontender, no signs of infection on his face or head. Given chronicity we will refer to ENT for further evaluation and consideration of biopsy.  Discussed diet and exercise. Advised to quit smoking. Offered support and these things though he declined quitting smoking at this time.  Orders Placed This Encounter  Procedures  . Ambulatory referral to ENT    Referral Priority:  Routine    Referral Type:  Consultation    Referral Reason:  Specialty Services Required    Requested Specialty:  Otolaryngology    Number of Visits Requested:  1  . Ambulatory referral to Rheumatology    Referral Priority:  Routine    Referral Type:  Consultation    Referral Reason:  Specialty Services Required    Requested Specialty:  Rheumatology    Number of Visits Requested:  1    Meds ordered this encounter  Medications  . sildenafil (VIAGRA) 50 MG tablet    Sig: Take 1 tablet (50 mg total) by mouth daily as needed for erectile dysfunction.    Dispense:  5 tablet    Refill:  0   Thomas Alar, MD [facility]

## 2015-06-29 ENCOUNTER — Other Ambulatory Visit: Payer: Self-pay | Admitting: Rheumatology

## 2015-06-29 DIAGNOSIS — R911 Solitary pulmonary nodule: Secondary | ICD-10-CM

## 2015-07-05 ENCOUNTER — Ambulatory Visit
Admission: RE | Admit: 2015-07-05 | Discharge: 2015-07-05 | Disposition: A | Discharge status: Home / Self Care | Payer: BLUE CROSS/BLUE SHIELD | Source: Ambulatory Visit | Attending: Rheumatology | Admitting: Rheumatology

## 2015-07-05 DIAGNOSIS — R911 Solitary pulmonary nodule: Secondary | ICD-10-CM | POA: Insufficient documentation

## 2015-07-09 ENCOUNTER — Other Ambulatory Visit: Payer: Self-pay | Admitting: Rheumatology

## 2015-07-09 DIAGNOSIS — R9389 Abnormal findings on diagnostic imaging of other specified body structures: Secondary | ICD-10-CM

## 2015-07-10 ENCOUNTER — Ambulatory Visit: Payer: Self-pay | Admitting: Cardiothoracic Surgery

## 2015-07-12 ENCOUNTER — Encounter
Admission: RE | Admit: 2015-07-12 | Discharge: 2015-07-12 | Disposition: A | Discharge status: Home / Self Care | Payer: BLUE CROSS/BLUE SHIELD | Source: Ambulatory Visit | Attending: Rheumatology | Admitting: Rheumatology

## 2015-07-12 DIAGNOSIS — R938 Abnormal findings on diagnostic imaging of other specified body structures: Secondary | ICD-10-CM | POA: Diagnosis present

## 2015-07-12 DIAGNOSIS — R9389 Abnormal findings on diagnostic imaging of other specified body structures: Secondary | ICD-10-CM

## 2015-07-12 LAB — GLUCOSE, CAPILLARY: GLUCOSE-CAPILLARY: 64 mg/dL — AB (ref 65–99)

## 2015-07-12 MED ORDER — FLUDEOXYGLUCOSE F - 18 (FDG) INJECTION
12.0000 | Freq: Once | INTRAVENOUS | Status: AC | PRN
  Administered 2015-07-12: 12.59 via INTRAVENOUS

## 2015-07-16 ENCOUNTER — Other Ambulatory Visit: Payer: Self-pay

## 2015-07-17 ENCOUNTER — Ambulatory Visit (INDEPENDENT_AMBULATORY_CARE_PROVIDER_SITE_OTHER): Payer: BLUE CROSS/BLUE SHIELD | Admitting: Cardiothoracic Surgery

## 2015-07-17 ENCOUNTER — Encounter: Payer: Self-pay | Admitting: Cardiothoracic Surgery

## 2015-07-17 ENCOUNTER — Telehealth: Payer: Self-pay

## 2015-07-17 VITALS — BP 114/72 | HR 80 | Temp 98.2°F | Ht 74.0 in | Wt 163.2 lb

## 2015-07-17 DIAGNOSIS — R918 Other nonspecific abnormal finding of lung field: Secondary | ICD-10-CM

## 2015-07-17 NOTE — Telephone Encounter (Addendum)
Patient seen in office this am. Needs Full PFT with ABG and MRI of Brain, then will follow-up in clinic afterwards.  Orders have been entered by surgeon.  MRI has been scheduled for: Tomorrow at [facility] , arrive at 1030am.   Pulmonary Function Test is scheduled for 07/19/15 at [facility]. Patient is to arrive at 0900am. No prep.

## 2015-07-17 NOTE — Telephone Encounter (Signed)
Patient's wife notified of all appointments below. She read back all information with location, time, and dates.  Follow-up appointment  Scheduled with Dr. Genevive Bi on 07/20/15.

## 2015-07-17 NOTE — Telephone Encounter (Signed)
Pre certification for MRI of Brain has been approved. This has been entered in [place].

## 2015-07-17 NOTE — Progress Notes (Signed)
Patient ID: Thomas Le, male   DOB: 05/31/1955, 60 y.o.   MRN: [id number]  Chief Complaint  Patient presents with  . Lung Mass    Referred By Dr. Gavin Le Reason for Referral bilateral lung masses  HPI Location, Quality, Duration, Severity, Timing, Context, Modifying Factors, Associated Signs and Symptoms.  Thomas Le is a 60 y.o. male.  He sought attention for weight loss and shortness of breath. He saw Dr. Gavin Le who he has known for a long period time. He obtained a chest x-ray is Thomas Le felt this may be important in distinguishing between the various connective tissue diseases. The chest x-ray that he obtained was abnormal and the patient then had a CT scan and a PET scan. The CT scan revealed 3 pulmonary lesions in the left upper lobe and one in the right upper lobe. The patient does have a history of renal cell carcinoma resected in 2012. That tumor was 4 cm in size. There is been no recurrence. The patient is otherwise asymptomatic. He states he is able to walk up a flight of stairs but just doesn't have the energy had years ago. He's had no fever or chills. He's lost a significant amount of weight but none within the last year or 2. He does smoke at least a half a pack cigarettes a day for over 30 years. He has 4-5 alcoholic drinks per day. There is no family history of lung cancer.   Past Medical History  Diagnosis Date  . Arthritis   . Colitis     Had blood in his stool with this and treated in the hospital  . Diverticulitis   . Chronic bronchitis (HCC)   . Allergic rhinitis   . Kidney disease   . Genital herpes   . Colon polyps   . Anxiety   . Depression   . ED (erectile dysfunction)   . Colitis cystica profunda   . Occipital lymphadenopathy   . Renal cancer [facility])     Past Surgical History  Procedure Laterality Date  . Kidney surgery Right 2012    Nephrectomy  . Partial colectomy  2012    For perforated colon related to diverticulitis- Thomas Le  .  Appendectomy  1966  . Vasectomy  2000    Family History  Problem Relation Age of Onset  . Arthritis/Rheumatoid      Parent  . Heart disease      Grandparent  . Alcoholism      Other relative  . Arthritis Mother     Rhematoid    Social History Social History  Substance Use Topics  . Smoking status: Current Every Day Smoker -- 0.50 packs/day for 30 years    Types: Cigarettes  . Smokeless tobacco: Never Used  . Alcohol Use: 0.0 oz/week    0 Standard drinks or equivalent per week     Comment: 5 beers a night     No Known Allergies  Current Outpatient Prescriptions  Medication Sig Dispense Refill  . amLODipine (NORVASC) 2.5 MG tablet Take 1 tablet by mouth daily.    Marland Kitchen ibuprofen (ADVIL,MOTRIN) 200 MG tablet Take 1 tablet by mouth every 6 (six) hours as needed.    . loperamide (IMODIUM A-D) 2 MG tablet Take 2 mg by mouth 4 (four) times daily as needed for diarrhea or loose stools.    . meloxicam (MOBIC) 7.5 MG tablet Take 1 tablet by mouth daily.    . sildenafil (VIAGRA) 50 MG tablet Take 1  tablet (50 mg total) by mouth daily as needed for erectile dysfunction. 5 tablet 0   No current facility-administered medications for this visit.      Review of Systems A complete review of systems was asked and was negative except for the following positive findingsDiarrhea, shortness of breath, weakness.  Blood pressure 114/72, pulse 80, temperature 98.2 F (36.8 C), temperature source Oral, height 6\' 2"  (1.88 m), weight 163 lb 3.2 oz (74.027 kg), SpO2 99 %.  Physical Exam CONSTITUTIONAL:  Pleasant, well-developed, well-nourished, and in no acute distress. EYES: Pupils equal and reactive to light, Sclera non-icteric EARS, NOSE, MOUTH AND THROAT:  The oropharynx was clear.  Dentition is good repair.  Oral mucosa pink and moist. LYMPH NODES:  Lymph nodes in the neck and axillae were normal RESPIRATORY:  Lungs were clear.  Normal respiratory effort without pathologic use of accessory  muscles of respiration CARDIOVASCULAR: Heart was regular without murmurs.  There were no carotid bruits. GI: The abdomen was soft, nontender, and nondistended. There were no palpable masses. There was no hepatosplenomegaly. There were normal bowel sounds in all quadrants. GU:  Rectal deferred.   MUSCULOSKELETAL:  Normal muscle strength and tone.  No clubbing or cyanosis.   SKIN:  There were no pathologic skin lesions.  There were no nodules on palpation. NEUROLOGIC:  Sensation is normal.  Cranial nerves are grossly intact. PSYCH:  Oriented to person, place and time.  Mood and affect are normal.  Data Reviewed CT scan and PET scan  I have personally reviewed the patient's imaging, laboratory findings and medical records.    Assessment    There are multiple bilateral pulmonary nodules. These are consistent with a primary lung cancer with intraparenchymal metastases, metastatic renal cell carcinoma or a connective tissue disorder with pulmonary involvement.    Plan    We will obtain a complete set of pulmonary function studies and a MRI scan of the brain to rule out any other metastatic disease.  I see him back once those are completed.      Thomas Marin, MD 07/17/2015, 9:31 AM

## 2015-07-17 NOTE — Patient Instructions (Signed)
We will schedule you for your Pulmonary Function Test and MRI of your Brain, then see you back in the clinic afterwards. I will call you with appointment details as soon as I have this scheduled.

## 2015-07-18 ENCOUNTER — Ambulatory Visit
Admission: RE | Admit: 2015-07-18 | Discharge: 2015-07-18 | Disposition: A | Discharge status: Home / Self Care | Payer: BLUE CROSS/BLUE SHIELD | Source: Ambulatory Visit | Attending: Cardiothoracic Surgery | Admitting: Cardiothoracic Surgery

## 2015-07-18 ENCOUNTER — Telehealth: Payer: Self-pay

## 2015-07-18 DIAGNOSIS — R918 Other nonspecific abnormal finding of lung field: Secondary | ICD-10-CM

## 2015-07-19 ENCOUNTER — Telehealth: Payer: Self-pay

## 2015-07-19 ENCOUNTER — Ambulatory Visit: Payer: BLUE CROSS/BLUE SHIELD | Attending: Cardiothoracic Surgery

## 2015-07-19 DIAGNOSIS — R918 Other nonspecific abnormal finding of lung field: Secondary | ICD-10-CM | POA: Diagnosis present

## 2015-07-19 DIAGNOSIS — J449 Chronic obstructive pulmonary disease, unspecified: Secondary | ICD-10-CM | POA: Insufficient documentation

## 2015-07-19 LAB — BLOOD GAS, ARTERIAL
Acid-base deficit: 0.1 mmol/L (ref 0.0–2.0)
Allens test (pass/fail): POSITIVE — AB
BICARBONATE: 24 meq/L (ref 21.0–28.0)
FIO2: 21
O2 SAT: 93.9 %
PATIENT TEMPERATURE: 37
PO2 ART: 69 mmHg — AB (ref 83.0–108.0)
pCO2 arterial: 37 mmHg (ref 32.0–48.0)
pH, Arterial: 7.42 (ref 7.350–7.450)

## 2015-07-19 NOTE — Telephone Encounter (Signed)
Patient scheduled for CT Head tomorrow 07/20/15. Arrive at 1045am in [place].

## 2015-07-19 NOTE — Telephone Encounter (Signed)
Patient walked in and explained that he became claustrophobic and could not complete MRI of Brain. He apologized several times for the inconvenience. I provided patient support for this as he was very upset and embarassed. I have contacted Dr. Genevive Bi to see how he would like to proceed with this.

## 2015-07-19 NOTE — Telephone Encounter (Signed)
Spoke with patient at this time. All information below was given to patient with read back. Moved office appointment to next Friday per Dr. Genevive Bi. Will call patient if anything else needed prior to appointment.

## 2015-07-19 NOTE — Telephone Encounter (Signed)
Patient scheduled for CT scan tomorrow at 1045am at [facility]. Patient has a lung mass and this needs to be done for Cancer staging as patient was unable to tolerate MRI.

## 2015-07-20 ENCOUNTER — Ambulatory Visit
Admission: RE | Admit: 2015-07-20 | Discharge: 2015-07-20 | Disposition: A | Discharge status: Home / Self Care | Payer: BLUE CROSS/BLUE SHIELD | Source: Ambulatory Visit | Attending: Cardiothoracic Surgery | Admitting: Cardiothoracic Surgery

## 2015-07-20 ENCOUNTER — Ambulatory Visit: Payer: Self-pay | Admitting: Cardiothoracic Surgery

## 2015-07-20 DIAGNOSIS — R918 Other nonspecific abnormal finding of lung field: Secondary | ICD-10-CM

## 2015-07-20 DIAGNOSIS — R930 Abnormal findings on diagnostic imaging of skull and head, not elsewhere classified: Secondary | ICD-10-CM | POA: Diagnosis not present

## 2015-07-20 MED ORDER — IOPAMIDOL (ISOVUE-300) INJECTION 61%
75.0000 mL | Freq: Once | INTRAVENOUS | Status: AC | PRN
  Administered 2015-07-20: 75 mL via INTRAVENOUS

## 2015-07-20 NOTE — Telephone Encounter (Signed)
Authorization has been obtained and pre service center was notified.

## 2015-07-27 ENCOUNTER — Ambulatory Visit (INDEPENDENT_AMBULATORY_CARE_PROVIDER_SITE_OTHER): Payer: BLUE CROSS/BLUE SHIELD | Admitting: Cardiothoracic Surgery

## 2015-07-27 ENCOUNTER — Encounter: Payer: Self-pay | Admitting: Cardiothoracic Surgery

## 2015-07-27 VITALS — BP 123/73 | HR 58 | Temp 98.3°F | Ht 74.0 in | Wt 162.0 lb

## 2015-07-27 DIAGNOSIS — R918 Other nonspecific abnormal finding of lung field: Secondary | ICD-10-CM | POA: Diagnosis not present

## 2015-07-27 NOTE — Progress Notes (Signed)
[name] Note  Patient ID: Thomas Le, male   DOB: 11-Feb-1956, 60 y.o.   MRN: [id number]  HISTORY: He returns today in follow-up. He did undergo a CT scan of the brain which reveals a questionable lesion in the right cortex. Sewn-in measured about 6 mm. He was unable to tolerate the MRI secondary to anxiety. He states that he does not currently get short of breath but does complain of early fatigue. Has not had any fevers or chills. He is scheduled to follow-up with our oncologist and radiation therapist next week for their opinion regarding the need for biopsy and/or additional imaging of the brain to document whether or not there is metastatic disease present.   Filed Vitals:   07/27/15 1038  BP: 123/73  Pulse: 58  Temp: 98.3 F (36.8 C)     EXAM: Resp: Lungs are clear bilaterally.  No respiratory distress, normal effort. Heart:  Regular without murmurs Abd:  Abdomen is soft, non distended and non tender. No masses are palpable.  There is no rebound and no guarding.  Neurological: Alert and oriented to person, place, and time. Coordination normal.  Skin: Skin is warm and dry. No rash noted. No diaphoretic. No erythema. No pallor.  Psychiatric: Normal mood and affect. Normal behavior. Judgment and thought content normal.    ASSESSMENT: I have independently reviewed the CT scan of the brain. There is a questionable lesion in the right cortex. I explained to the patient great detail the indications for surgical intervention given his current lung condition. His pulmonary functions are adequate for left upper lobectomy. Also discussed possibly performing a lung biopsy to establish a lung cancer diagnosis versus a metastatic renal cell. He understands all this. Due to the complexity of his situation I would like to elicit the input from our oncologist and radiation therapist.   PLAN:   We will obtain a consultation with our radiation therapist and oncologist. I also  talked to him about smoking cessation today. He is still currently smoking although he has been able to cut back significantly.    Nestor Lewandowsky, MD

## 2015-07-27 NOTE — Patient Instructions (Signed)
We will call you once we set up an appointment with the Oncologist.

## 2015-07-30 ENCOUNTER — Telehealth: Payer: Self-pay

## 2015-07-30 NOTE — Telephone Encounter (Signed)
Spoke with Burgess Estelle about patient needing an appointment to see Medical Oncologist and have made several attempts at making this appointment for the patient. He states that an appointment cannot be made with Medical Oncology prior to seeing Dr. Donella Stade and then Radiation Oncologist will order what is needed as next step for this patient. I did reiterate newly found area in frontal lobe of brain that is concerning. Raquel Sarna states that patient will be scheduled to see Oncologist after seeing Dr. Donella Stade.

## 2015-07-31 ENCOUNTER — Ambulatory Visit
Admission: RE | Admit: 2015-07-31 | Discharge: 2015-07-31 | Disposition: A | Discharge status: Home / Self Care | Payer: BLUE CROSS/BLUE SHIELD | Source: Ambulatory Visit | Attending: Radiation Oncology | Admitting: Radiation Oncology

## 2015-07-31 ENCOUNTER — Encounter: Payer: Self-pay | Admitting: Radiation Oncology

## 2015-07-31 VITALS — BP 126/74 | HR 66 | Temp 98.4°F | Ht 74.0 in | Wt 165.2 lb

## 2015-07-31 DIAGNOSIS — F419 Anxiety disorder, unspecified: Secondary | ICD-10-CM | POA: Diagnosis not present

## 2015-07-31 DIAGNOSIS — Z79899 Other long term (current) drug therapy: Secondary | ICD-10-CM | POA: Insufficient documentation

## 2015-07-31 DIAGNOSIS — N529 Male erectile dysfunction, unspecified: Secondary | ICD-10-CM | POA: Diagnosis not present

## 2015-07-31 DIAGNOSIS — Z8553 Personal history of malignant neoplasm of renal pelvis: Secondary | ICD-10-CM | POA: Insufficient documentation

## 2015-07-31 DIAGNOSIS — K5289 Other specified noninfective gastroenteritis and colitis: Secondary | ICD-10-CM | POA: Insufficient documentation

## 2015-07-31 DIAGNOSIS — M129 Arthropathy, unspecified: Secondary | ICD-10-CM | POA: Diagnosis not present

## 2015-07-31 DIAGNOSIS — F17210 Nicotine dependence, cigarettes, uncomplicated: Secondary | ICD-10-CM | POA: Insufficient documentation

## 2015-07-31 DIAGNOSIS — Z86010 Personal history of colonic polyps: Secondary | ICD-10-CM | POA: Insufficient documentation

## 2015-07-31 DIAGNOSIS — R220 Localized swelling, mass and lump, head: Secondary | ICD-10-CM | POA: Diagnosis not present

## 2015-07-31 DIAGNOSIS — F329 Major depressive disorder, single episode, unspecified: Secondary | ICD-10-CM | POA: Insufficient documentation

## 2015-07-31 DIAGNOSIS — J42 Unspecified chronic bronchitis: Secondary | ICD-10-CM | POA: Diagnosis not present

## 2015-07-31 DIAGNOSIS — A6000 Herpesviral infection of urogenital system, unspecified: Secondary | ICD-10-CM | POA: Insufficient documentation

## 2015-07-31 DIAGNOSIS — R918 Other nonspecific abnormal finding of lung field: Secondary | ICD-10-CM | POA: Diagnosis not present

## 2015-07-31 DIAGNOSIS — N189 Chronic kidney disease, unspecified: Secondary | ICD-10-CM | POA: Diagnosis not present

## 2015-07-31 DIAGNOSIS — G939 Disorder of brain, unspecified: Secondary | ICD-10-CM

## 2015-07-31 NOTE — Consult Note (Signed)
Except an outstanding is perfect of Radiation Oncology NEW PATIENT EVALUATION  Name: Thomas Le  MRN: [id number]  Date:   07/31/2015     DOB: October 06, 1955   This 60 y.o. male patient presents to the clinic for initial evaluation of stage I non-biopsied left lung probable non-small cell lung cancer with questionable brain metastasis.  REFERRING PHYSICIAN: Glori Luis, MD  CHIEF COMPLAINT:  Chief Complaint  Patient presents with  . Cancer    Initial evaluation of radiation treatments to the brain.    DIAGNOSIS: The primary encounter diagnosis was Lung mass. A diagnosis of Brain lesion was also pertinent to this visit.   PREVIOUS INVESTIGATIONS:  CT scan of head chest reviewed  Clinical notes reviewed Case discussed with multimodality team  HPI: Patient is a 60 year old male status post right nephrectomy back in 2012 for renal cell carcinoma 4.4 cm with tumor extending into the perinephric fat stage P T3a. He has done well postoperatively. Recently presented with increasing shortness of breath and weight loss was seen by Dr. Sharl Ma nodal and chest x-ray showed a left upper lobe lesion consistent with primary bronchogenic carcinoma. PET scan was performed showing hypermetabolic left upper lobe lung nodule suspicious again for primary bronchogenic carcinoma no evidence of mediastinal adenopathy was noted. There was a right upper lobe nodule below PET resolution. Patient cannot tolerate MRI scan of the brain CT scan demonstrated no definitive intracranial metastatic disease was a questionable 6 mm enhancing lytic lesion versus an artifact in the right frontal cortex for which tiny metastasis cannot be excluded. Do not see any evidence of my review of any surrounding edema of this lesion. Patient specifically denies any headaches or any change in neurologic status. He is seen today for consideration of treatment. He is having no snip significant pulmonary symptoms at this time no cough  hemoptysis or chest tightness.  PLANNED TREATMENT REGIMEN: Recommendation for surgical resection of lung mass  PAST MEDICAL HISTORY:  has a past medical history of Arthritis; Colitis; Diverticulitis; Chronic bronchitis (HCC); Allergic rhinitis; Kidney disease; Genital herpes; Colon polyps; Anxiety; Depression; ED (erectile dysfunction); Colitis cystica profunda; Occipital lymphadenopathy; and Renal cancer (HCC).    PAST SURGICAL HISTORY:  Past Surgical History  Procedure Laterality Date  . Kidney surgery Right 2012    Nephrectomy  . Partial colectomy  2012    For perforated colon related to diverticulitis- Dr. Egbert Garibaldi  . Appendectomy  1966  . Vasectomy  2000    FAMILY HISTORY: family history includes Arthritis in his mother.  SOCIAL HISTORY:  reports that he has been smoking Cigarettes.  He has a 15 pack-year smoking history. He has never used smokeless tobacco. He reports that he drinks alcohol. He reports that he does not use illicit drugs.  ALLERGIES: Review of patient's allergies indicates no known allergies.  MEDICATIONS:  Current Outpatient Prescriptions  Medication Sig Dispense Refill  . amLODipine (NORVASC) 2.5 MG tablet Take 1 tablet by mouth daily.    Marland Kitchen ibuprofen (ADVIL,MOTRIN) 200 MG tablet Take 1 tablet by mouth every 6 (six) hours as needed.    . loperamide (IMODIUM A-D) 2 MG tablet Take 2 mg by mouth 4 (four) times daily as needed for diarrhea or loose stools.    . meloxicam (MOBIC) 7.5 MG tablet Take 1 tablet by mouth daily.    . sildenafil (VIAGRA) 50 MG tablet Take 1 tablet (50 mg total) by mouth daily as needed for erectile dysfunction. 5 tablet 0   No current  facility-administered medications for this encounter.    ECOG PERFORMANCE STATUS:  0 - Asymptomatic  REVIEW OF SYSTEMS: Except for the decreased appetite fatigue and shortness of breath Patient denies any weight loss, fatigue, weakness, fever, chills or night sweats. Patient denies any loss of vision,  blurred vision. Patient denies any ringing  of the ears or hearing loss. No irregular heartbeat. Patient denies heart murmur or history of fainting. Patient denies any chest pain or pain radiating to her upper extremities. Patient denies any shortness of breath, difficulty breathing at night, cough or hemoptysis. Patient denies any swelling in the lower legs. Patient denies any nausea vomiting, vomiting of blood, or coffee ground material in the vomitus. Patient denies any stomach pain. Patient states has had normal bowel movements no significant constipation or diarrhea. Patient denies any dysuria, hematuria or significant nocturia. Patient denies any problems walking, swelling in the joints or loss of balance. Patient denies any skin changes, loss of hair or loss of weight. Patient denies any excessive worrying or anxiety or significant depression. Patient denies any problems with insomnia. Patient denies excessive thirst, polyuria, polydipsia. Patient denies any swollen glands, patient denies easy bruising or easy bleeding. Patient denies any recent infections, allergies or URI. Patient "s visual fields have not changed significantly in recent time.    PHYSICAL EXAM: BP 126/74 mmHg  Pulse 66  Temp(Src) 98.4 F (36.9 C)  Ht 6\' 2"  (1.88 m)  Wt 165 lb 3.8 oz (74.95 kg)  BMI 21.21 kg/m2 Thin well-developed male in NAD. No cervical or supraclavicular adenopathy is identified. Crude visual fields are within normal range. Neurologic exam is essentially unremarkable. Well-developed well-nourished patient in NAD. HEENT reveals PERLA, EOMI, discs not visualized.  Oral cavity is clear. No oral mucosal lesions are identified. Neck is clear without evidence of cervical or supraclavicular adenopathy. Lungs are clear to A&P. Cardiac examination is essentially unremarkable with regular rate and rhythm without murmur rub or thrill. Abdomen is benign with no organomegaly or masses noted. Motor sensory and DTR levels  are equal and symmetric in the upper and lower extremities. Cranial nerves II through XII are grossly intact. Proprioception is intact. No peripheral adenopathy or edema is identified. No motor or sensory levels are noted. Crude visual fields are within normal range.  LABORATORY DATA: Prior pathology report for renal cell carcinoma reviewed    RADIOLOGY RESULTS: CT scans chest abdomen head reviewed PET CT scan reviewed   IMPRESSION: Probable stage I bronchogenic carcinoma. Her lobe in 60 year old male  PLAN: At this time I'm not convinced the lesion of the right frontal cortex lesion is real or artifact. I would disregard at this time and possibly repeat his head CT or MRI scan under anesthesia in several months. I would concentrate on his left upper lobe lesion which is hypermetabolic and probably a bronchogenic carcinoma. I have recommended to Dr. Inez Catalina for surgical resection which the patient is in favor of. I discussed the case and will make further recommendations at the time of biopsy. Certainly if the brain lesion pans out to be a metastatic focus could have radiation therapy for palliation at that time. We have contacted Dr. Elberta Leatherwood office and set up a follow-up appointment. I have personally discussed the case with him and he agrees with my treatment recommendations.  I would like to take this opportunity to thank you for allowing me to participate in the care of your patient.Rebeca Alert., MD

## 2015-08-01 ENCOUNTER — Telehealth: Payer: Self-pay

## 2015-08-01 NOTE — Telephone Encounter (Signed)
Just received a call from the [name] letting us know that Mr. Thomas Le was scheduled to be seen on Friday 08/03/2015 at 2:30 PM but to arrive at 2:15 PM. I told patient that this appointment will be at the [facility]. Patient agreed on going to the appointment. We will see him on Tuesday 08/07/2015.

## 2015-08-03 ENCOUNTER — Encounter: Payer: Self-pay | Admitting: Internal Medicine

## 2015-08-03 ENCOUNTER — Inpatient Hospital Stay: Payer: BLUE CROSS/BLUE SHIELD | Attending: Internal Medicine | Admitting: Internal Medicine

## 2015-08-03 VITALS — BP 127/72 | HR 66 | Temp 98.7°F | Resp 18 | Ht 73.0 in | Wt 166.7 lb

## 2015-08-03 DIAGNOSIS — Z85528 Personal history of other malignant neoplasm of kidney: Secondary | ICD-10-CM

## 2015-08-03 DIAGNOSIS — F17200 Nicotine dependence, unspecified, uncomplicated: Secondary | ICD-10-CM

## 2015-08-03 DIAGNOSIS — M1990 Unspecified osteoarthritis, unspecified site: Secondary | ICD-10-CM

## 2015-08-03 DIAGNOSIS — Z79899 Other long term (current) drug therapy: Secondary | ICD-10-CM

## 2015-08-03 DIAGNOSIS — L940 Localized scleroderma [morphea]: Secondary | ICD-10-CM

## 2015-08-03 DIAGNOSIS — Z902 Acquired absence of lung [part of]: Secondary | ICD-10-CM | POA: Diagnosis not present

## 2015-08-03 DIAGNOSIS — F17210 Nicotine dependence, cigarettes, uncomplicated: Secondary | ICD-10-CM

## 2015-08-03 DIAGNOSIS — C3412 Malignant neoplasm of upper lobe, left bronchus or lung: Secondary | ICD-10-CM | POA: Insufficient documentation

## 2015-08-03 DIAGNOSIS — Z905 Acquired absence of kidney: Secondary | ICD-10-CM | POA: Insufficient documentation

## 2015-08-03 DIAGNOSIS — R911 Solitary pulmonary nodule: Secondary | ICD-10-CM | POA: Insufficient documentation

## 2015-08-03 DIAGNOSIS — R0602 Shortness of breath: Secondary | ICD-10-CM | POA: Diagnosis not present

## 2015-08-03 DIAGNOSIS — R9402 Abnormal brain scan: Secondary | ICD-10-CM | POA: Insufficient documentation

## 2015-08-03 DIAGNOSIS — K5790 Diverticulosis of intestine, part unspecified, without perforation or abscess without bleeding: Secondary | ICD-10-CM | POA: Diagnosis not present

## 2015-08-03 MED ORDER — NICOTINE 21 MG/24HR TD PT24: 21.0000 mg | MEDICATED_PATCH | Freq: Every day | TRANSDERMAL | Status: DC

## 2015-08-03 NOTE — Progress Notes (Signed)
Patient here today as new evaluation for right cortex lung cancer.  Patient has renal cancer 5 years ago and had kidney removed as well as colon resected.

## 2015-08-03 NOTE — Progress Notes (Signed)
[facility] NOTE  Patient Care Team: Leone Haven, MD as PCP - General (Family Medicine)  CHIEF COMPLAINTS/PURPOSE OF CONSULTATION:   # LUNG NODULE [May 2017]- incidental ~2 CM  # KIDNEY CANCER [incidental 2012; diverticulitis] pT3a (4.3x4.3x 3.2cm) [facility]- clear cell G-2; Neg margins; May 2012 ]  # ? Scleroderma [Almodipine; Dr.Kernodle]  HISTORY OF PRESENTING ILLNESS:  Thomas Le 60 y.o.  male a very pleasant patient long-standing history of smoking; and a history of kidney cancer status post nephrectomy in 2012.  Patient has long-standing pain in his hands; and also ulceration/discoloration of fingertips- recently diagnosed scleroderma and started on amlodipine. As part of the physical he had a chest x-ray that was abnormal. He had a CT scan- shows 2.5 cm left upper lobe lung nodule; PET avid-no distant metastasis. CT scan of the brain with contrast- questionable 4 mm right frontal/cortical lesion. He has been evaluated by thoracic surgery Dr. Faith Rogue- would recommend evaluation with medical oncology given history of kidney cancer.  Patient has chronic mild shortness of breath. Otherwise denies any COPD. Denies any cough or hemoptysis. Appetite good. No weight loss. Denies any headaches. Denies any vision changes.  ROS: A complete 10 point review of system is done which is negative except mentioned above in history of present illness  MEDICAL HISTORY:  Past Medical History  Diagnosis Date  . Arthritis   . Colitis     Had blood in his stool with this and treated in the hospital  . Diverticulitis   . Chronic bronchitis ([place])   . Allergic rhinitis   . Kidney disease   . Genital herpes   . Colon polyps   . Anxiety   . Depression   . ED (erectile dysfunction)   . Colitis cystica profunda   . Occipital lymphadenopathy   . Renal cancer ([place])   . Cancer [facility])     SURGICAL HISTORY: Past Surgical History  Procedure Laterality Date  . Kidney surgery  Right 2012    Nephrectomy  . Partial colectomy  2012    For perforated colon related to diverticulitis- Dr. Marina Gravel  . Appendectomy  1966  . Vasectomy  2000    SOCIAL HISTORY: Social History   Social History  . Marital Status: Married    Spouse Name: N/A  . Number of Children: N/A  . Years of Education: N/A   Occupational History  . Not on file.   Social History Main Topics  . Smoking status: Current Every Day Smoker -- 0.50 packs/day for 30 years    Types: Cigarettes  . Smokeless tobacco: Never Used  . Alcohol Use: 0.0 oz/week    0 Standard drinks or equivalent per week     Comment: 5 beers a night   . Drug Use: No  . Sexual Activity: Not on file   Other Topics Concern  . Not on file   Social History Narrative    FAMILY HISTORY: Family History  Problem Relation Age of Onset  . Arthritis/Rheumatoid      Parent  . Heart disease      Grandparent  . Alcoholism      Other relative  . Arthritis Mother     Rhematoid    ALLERGIES:  has No Known Allergies.  MEDICATIONS:  Current Outpatient Prescriptions  Medication Sig Dispense Refill  . amLODipine (NORVASC) 2.5 MG tablet Take 1 tablet by mouth daily.    Marland Kitchen ibuprofen (ADVIL,MOTRIN) 200 MG tablet Take 1 tablet by mouth  every 6 (six) hours as needed.    . loperamide (IMODIUM A-D) 2 MG tablet Take 2 mg by mouth 4 (four) times daily as needed for diarrhea or loose stools.    . meloxicam (MOBIC) 7.5 MG tablet Take 1 tablet by mouth daily.    . sildenafil (VIAGRA) 50 MG tablet Take 1 tablet (50 mg total) by mouth daily as needed for erectile dysfunction. 5 tablet 0   No current facility-administered medications for this visit.      Marland Kitchen  PHYSICAL EXAMINATION: ECOG PERFORMANCE STATUS: 0 - Asymptomatic  Filed Vitals:   08/03/15 1459  BP: 127/72  Pulse: 66  Temp: 98.7 F (37.1 C)  Resp: 18   Filed Weights   08/03/15 1459  Weight: 166 lb 10.7 oz (75.6 kg)    GENERAL: Well-nourished well-developed; Alert, no  distress and comfortable. With his wife.  EYES: no pallor or icterus OROPHARYNX: no thrush or ulceration; good dentition  NECK: supple, no masses felt LYMPH:  no palpable lymphadenopathy in the cervical, axillary or inguinal regions LUNGS: clear to auscultation and  No wheeze or crackles HEART/CVS: regular rate & rhythm and no murmurs; No lower extremity edema ABDOMEN: abdomen soft, non-tender and normal bowel sounds Musculoskeletal:no cyanosis of digits and no clubbing  PSYCH: alert & oriented x 3 with fluent speech NEURO: no focal motor/sensory deficits SKIN:  no rashes or significant lesions  LABORATORY DATA:  I have reviewed the data as listed Lab Results  Component Value Date   WBC 8.7 06/12/2015   HGB 15.5 06/12/2015   HCT 46.3 06/12/2015   MCV 98.6 06/12/2015   PLT 267.0 06/12/2015    Recent Labs  06/12/15 0948  NA 137  K 4.7  CL 103  CO2 28  GLUCOSE 91  BUN 17  CREATININE 1.20  CALCIUM 9.5  PROT 7.1  ALBUMIN 4.1  AST 24  ALT 14  ALKPHOS 56  BILITOT 0.7    RADIOGRAPHIC STUDIES: I have personally reviewed the radiological images as listed and agreed with the findings in the report. Ct Head W Wo Contrast  07/20/2015  CLINICAL DATA:  Left upper lobe lung nodule.  Cancer staging. EXAM: CT HEAD WITHOUT AND WITH CONTRAST TECHNIQUE: Contiguous axial images were obtained from the base of the skull through the vertex without and with intravenous contrast CONTRAST:  37m ISOVUE-300 IOPAMIDOL (ISOVUE-300) INJECTION 61% COMPARISON:  None. FINDINGS: There is no evidence of acute cortical infarct, intracranial hemorrhage, mass, midline shift, or extra-axial fluid collection. Ventricles and sulci are normal for age. There is a 6 x 4 mm irregular focus of enhancement versus artifact involving right frontal cortex on a single image (series 3, image 17). No other enhancing brain lesions are identified. An incidental developmental venous anomaly is noted in the right centrum  semiovale. IMPRESSION: No definite intracranial metastatic disease identified. Questionable 6 x 4 mm enhancing lesion versus artifact in the right frontal cortex, for which a tiny metastasis is not excluded. Attention on follow-up. Electronically Signed   By: ALogan BoresM.D.   On: 07/20/2015 13:54   Ct Chest Wo Contrast  07/05/2015  CLINICAL DATA:  Abnormal chest x-ray with possible mass, initial encounter EXAM: CT CHEST WITHOUT CONTRAST TECHNIQUE: Multidetector CT imaging of the chest was performed following the standard protocol without IV contrast. COMPARISON:  By report from 06/25/2015 FINDINGS: The lungs are well aerated bilaterally. The right lung shows no focal infiltrate or effusion. No nodular changes are seen. The left lung however there  is a somewhat geographic lesion which measures approximately 2.8 x 1.8 cm in greatest dimension. Second somewhat contiguous area of density is noted which measures approximately 13 mm in greatest dimension. Increased spiculation is noted surrounding these areas and are consistent with pulmonary neoplasm till proven otherwise. The thoracic inlet is within normal limits. The thoracic aorta show some calcification without aneurysmal dilatation. Coronary calcifications are seen. No definitive hilar or mediastinal adenopathy is noted. Old healed rib fractures are noted on the left. Visualized upper abdomen shows changes consistent with prior right nephrectomy. Some fullness in the region of the left adrenal gland is noted but stable from the prior exam likely related to an adenoma. Hepatic cysts are again noted. IMPRESSION: Changes consistent with a left upper lobe primary pulmonary neoplasm until proven otherwise. Given the relative lack of adenopathy, tissue sampling and further workup via PET-CT is recommended. Thoracic surgery consultation is recommended as well. These results will be called to the ordering clinician or representative by the Radiologist Assistant, and  communication documented in the PACS or zVision Dashboard. Electronically Signed   By: Inez Catalina M.D.   On: 07/05/2015 08:39   Nm Pet Image Initial (pi) Skull Base To Thigh  07/12/2015  CLINICAL DATA:  Initial treatment strategy for left upper lobe pulmonary nodule. EXAM: NUCLEAR MEDICINE PET SKULL BASE TO THIGH TECHNIQUE: 12.6 mCi F-18 FDG was injected intravenously. Full-ring PET imaging was performed from the skull base to thigh after the radiotracer. CT data was obtained and used for attenuation correction and anatomic localization. FASTING BLOOD GLUCOSE:  Value: 64 mg/dl COMPARISON:  Chest CT 07/05/2015. Abdominal pelvic CT of 12/06/2012. FINDINGS: NECK No areas of abnormal hypermetabolism. CHEST Hypermetabolism corresponding to a posterior left upper lobe irregular nodule. This measures 2.5 x 1.7 cm and a S.U.V. max of 3.4 on image 95/ series 3. No thoracic nodal hypermetabolism. ABDOMEN/PELVIS No areas of abnormal hypermetabolism. SKELETON No abnormal marrow activity. CT IMAGES PERFORMED FOR ATTENUATION CORRECTION No cervical adenopathy. Chest findings deferred to recent diagnostic CT. Multivessel coronary artery atherosclerosis. Centrilobular emphysema. Right upper lobe pulmonary nodule of 4 mm on image 113/series 3 is below PET resolution. Left adrenal low-density thickening likely relates to hyperplasia or small adenoma. Right nephrectomy. Left hepatic lobe cysts. Prostatic calcifications. IMPRESSION: 1. Hypermetabolic left upper lobe lung nodule, suspicious for primary bronchogenic carcinoma. Given history of renal cell carcinoma, isolated metastatic disease is possible but felt less likely. 2. No thoracic nodal or extrathoracic metastatic disease identified. 3. Right upper lobe nonspecific pulmonary nodule is below PET resolution. 4.  Atherosclerosis, including within the coronary arteries. Electronically Signed   By: Abigail Miyamoto M.D.   On: 07/12/2015 10:42    ASSESSMENT & PLAN:   # LUL lung  nodule- approximately 2 cm in size; PET avid no evidence of distant metastatic disease. Question lung primary versus solitary metastases [previous history of kidney cancer]. The only way to know for sure would be a biopsy of the lung nodule. However in absence of any other metastatic disease- the definitive treatment in either of the above situations would be resection of the left upper lobe lung nodule. And further determination could be made regarding treatment options based upon source of the lung lesion. Also discussed that if this was truly lung primary- this would be stage I; and hence no further chemotherapy would be recommended. However if this was a renal metastases-further management as debatable- observation versus tyrosine kinase inhibitor therapy.   # Renal cell cancer- pT3- plan  above  # Questionable right frontal lesion on CT of the brain with contrast. Reviewed with radiology- unlikely to be very concerning/altering the course of above plan. Patient also met with radiation oncology.  # Counseled again quitting smoking. Patient is motivated. Given prescription for nicotine patch.  # Patient follow-up with me in approximately 3-4 weeks post surgery. I paged Dr. Faith Rogue to discuss my above plan. Also will discuss at the tumor conference next week  All questions were answered. The patient knows to call the clinic with any problems, questions or concerns.  Thank you Dr. Genevive Bi for allowing me to participate in the care of your pleasant patient. Please do not hesitate to contact me with questions or concerns in the interim.  # 60 minutes face-to-face with the patient discussing the above plan of care; more than 50% of time spent on prognosis/ natural history; counseling and coordination.       Cammie Sickle, MD 08/03/2015 3:10 PM

## 2015-08-07 ENCOUNTER — Ambulatory Visit
Admission: RE | Admit: 2015-08-07 | Discharge: 2015-08-07 | Disposition: A | Discharge status: Home / Self Care | Payer: BLUE CROSS/BLUE SHIELD | Source: Ambulatory Visit | Attending: Cardiothoracic Surgery | Admitting: Cardiothoracic Surgery

## 2015-08-07 ENCOUNTER — Other Ambulatory Visit
Admission: RE | Admit: 2015-08-07 | Discharge: 2015-08-07 | Disposition: A | Discharge status: Home / Self Care | Payer: BLUE CROSS/BLUE SHIELD | Source: Ambulatory Visit | Attending: Cardiothoracic Surgery | Admitting: Cardiothoracic Surgery

## 2015-08-07 ENCOUNTER — Ambulatory Visit (INDEPENDENT_AMBULATORY_CARE_PROVIDER_SITE_OTHER): Payer: BLUE CROSS/BLUE SHIELD | Admitting: Cardiothoracic Surgery

## 2015-08-07 ENCOUNTER — Other Ambulatory Visit: Payer: Self-pay

## 2015-08-07 ENCOUNTER — Encounter: Payer: Self-pay | Admitting: Cardiothoracic Surgery

## 2015-08-07 ENCOUNTER — Other Ambulatory Visit: Payer: Self-pay | Admitting: Cardiothoracic Surgery

## 2015-08-07 VITALS — BP 135/78 | HR 58 | Temp 98.1°F | Ht 73.0 in | Wt 165.8 lb

## 2015-08-07 DIAGNOSIS — Z09 Encounter for follow-up examination after completed treatment for conditions other than malignant neoplasm: Secondary | ICD-10-CM

## 2015-08-07 DIAGNOSIS — R911 Solitary pulmonary nodule: Secondary | ICD-10-CM | POA: Diagnosis not present

## 2015-08-07 DIAGNOSIS — R918 Other nonspecific abnormal finding of lung field: Secondary | ICD-10-CM

## 2015-08-07 NOTE — Patient Instructions (Signed)
We will call you with your surgery date. Please call our office if you have any questions or concerns.

## 2015-08-07 NOTE — Progress Notes (Signed)
Patient ID: CODEN [place], male   DOB: December 09, 1955, 60 y.o.   MRN: [id number]  Chief Complaint  Patient presents with  . Follow-up    Lung Mass    HPI Thomas Le is a 60 y.o. male.  He saw Dr. Lattie Haw who did not recommend any percutaneous biopsy at this time. He did recommend surgical resection. The patient states that he would like to move forward with lung resection. He does continue to smoke however at least a half a pack of cigarettes. He does not complain of any shortness of breath.   Past Medical History  Diagnosis Date  . Arthritis   . Colitis     Had blood in his stool with this and treated in the hospital  . Diverticulitis   . Chronic bronchitis ([facility])   . Allergic rhinitis   . Kidney disease   . Genital herpes   . Colon polyps   . Anxiety   . Depression   . ED (erectile dysfunction)   . Colitis cystica profunda   . Occipital lymphadenopathy   . Renal cancer ([facility])   . Cancer [facility])     Past Surgical History  Procedure Laterality Date  . Kidney surgery Right 2012    Nephrectomy  . Partial colectomy  2012    For perforated colon related to diverticulitis- Dr. Egbert Garibaldi  . Appendectomy  1966  . Vasectomy  2000    Family History  Problem Relation Age of Onset  . Arthritis/Rheumatoid      Parent  . Heart disease      Grandparent  . Alcoholism      Other relative  . Arthritis Mother     Rhematoid    Social History Social History  Substance Use Topics  . Smoking status: Current Every Day Smoker -- 0.50 packs/day for 30 years    Types: Cigarettes  . Smokeless tobacco: Never Used  . Alcohol Use: 0.0 oz/week    0 Standard drinks or equivalent per week     Comment: 5 beers a night     No Known Allergies  Current Outpatient Prescriptions  Medication Sig Dispense Refill  . amLODipine (NORVASC) 2.5 MG tablet Take 1 tablet by mouth daily.    Marland Kitchen ibuprofen (ADVIL,MOTRIN) 200 MG tablet Take 1 tablet by mouth every 6 (six) hours as needed.    . loperamide  (IMODIUM A-D) 2 MG tablet Take 2 mg by mouth 4 (four) times daily as needed for diarrhea or loose stools.    . meloxicam (MOBIC) 7.5 MG tablet Take 1 tablet by mouth daily.    . nicotine (EQ NICOTINE) 21 mg/24hr patch Place 1 patch (21 mg total) onto the skin daily. 28 patch 0  . sildenafil (VIAGRA) 50 MG tablet Take 1 tablet (50 mg total) by mouth daily as needed for erectile dysfunction. 5 tablet 0   No current facility-administered medications for this visit.    Location, Quality, Duration, Severity, Timing, Context, Modifying Factors, Associated Signs and Symptoms.  Review of Systems A complete review of systems was asked and was negative except for the following positive findingsShortness of breath with exertion. Anxiety.  Blood pressure 135/78, pulse 58, temperature 98.1 F (36.7 C), temperature source Oral, height 6\' 1"  (1.854 m), weight 165 lb 12.8 oz (75.206 kg), SpO2 98 %.  Physical Exam CONSTITUTIONAL:  Pleasant, well-developed, well-nourished, and in no acute distress. EYES: Pupils equal and reactive to light, Sclera non-icteric EARS, NOSE, MOUTH AND THROAT:  The oropharynx was  clear.  Dentition is good repair.  Oral mucosa pink and moist. LYMPH NODES:  Lymph nodes in the neck and axillae were normal RESPIRATORY:  Lungs were clear.  Normal respiratory effort without pathologic use of accessory muscles of respiration CARDIOVASCULAR: Heart was regular without murmurs.  There were no carotid bruits. GI: The abdomen was soft, nontender, and nondistended. There were no palpable masses. There was no hepatosplenomegaly. There were normal bowel sounds in all quadrants. GU:   MUSCULOSKELETAL:  Normal muscle strength and tone.  No clubbing or cyanosis.   SKIN:  There were no pathologic skin lesions.  There were no nodules on palpation. NEUROLOGIC:  Sensation is normal.  Cranial nerves are grossly intact. PSYCH:  Oriented to person, place and time.  Mood and affect are normal.  Data  Reviewed   I have personally reviewed the patient's imaging and medical records.    Assessment    I did have the opportunity today to review his CT scans and PET scans. It's unclear whether or not this represents a bronchogenic carcinoma or metastatic disease. His pulmonary function tests are adequate for surgery.    Plan    We will plan on admitting the patient to the hospital for a left thoracotomy and left upper lobe resection. He understands that there is a small nodule in the right lung which required additional follow-up. All risks and benefits were discussed.    Thomas Marin, MD 08/07/2015, 12:37 PM

## 2015-08-08 ENCOUNTER — Telehealth: Payer: Self-pay | Admitting: Cardiothoracic Surgery

## 2015-08-08 NOTE — Telephone Encounter (Signed)
Pt advised of pre op date/time and sx date. Sx: 08/13/15 with Dr Genevive Bi, Dr Azalee Course assisting--Pre op bronchoscopy, left thoracotomy with left upper lobectomy.  Pre op: 08/09/15 @ 2:00pm--office.   Patient made aware to call [phone], between 1-3:00pm the day before surgery, to find out what time to arrive.

## 2015-08-09 ENCOUNTER — Encounter
Admission: RE | Admit: 2015-08-09 | Discharge: 2015-08-09 | Disposition: A | Discharge status: Home / Self Care | Payer: BLUE CROSS/BLUE SHIELD | Source: Ambulatory Visit | Attending: Cardiothoracic Surgery | Admitting: Cardiothoracic Surgery

## 2015-08-09 ENCOUNTER — Other Ambulatory Visit: Payer: Self-pay | Admitting: Internal Medicine

## 2015-08-09 DIAGNOSIS — R918 Other nonspecific abnormal finding of lung field: Secondary | ICD-10-CM | POA: Insufficient documentation

## 2015-08-09 DIAGNOSIS — Z01810 Encounter for preprocedural cardiovascular examination: Secondary | ICD-10-CM | POA: Insufficient documentation

## 2015-08-09 DIAGNOSIS — Z01812 Encounter for preprocedural laboratory examination: Secondary | ICD-10-CM | POA: Diagnosis present

## 2015-08-09 HISTORY — DX: Other complications of anesthesia, initial encounter: T88.59XA

## 2015-08-09 HISTORY — DX: Peripheral vascular disease, unspecified: I73.9

## 2015-08-09 HISTORY — DX: Adverse effect of unspecified anesthetic, initial encounter: T41.45XA

## 2015-08-09 HISTORY — DX: Reserved for inherently not codable concepts without codable children: IMO0001

## 2015-08-09 LAB — SURGICAL PCR SCREEN
MRSA, PCR: POSITIVE — AB
STAPHYLOCOCCUS AUREUS: POSITIVE — AB

## 2015-08-09 NOTE — Patient Instructions (Addendum)
Your procedure is scheduled on: Monday 08/13/15 Report to Day Surgery. 2ND FLOOR MEDICAL MALL ENTRANCE To find out your arrival time please call [phone] between 1PM - 3PM on Friday 08/10/15.  Remember: Instructions that are not followed completely may result in serious medical risk, up to and including death, or upon the discretion of your surgeon and anesthesiologist your surgery may need to be rescheduled.    __X__ 1. Do not eat food or drink liquids after midnight. No gum chewing or hard candies.     __X__ 2. No Alcohol for 24 hours before or after surgery.   ____ 3. Bring all medications with you on the day of surgery if instructed.    __X__ 4. Notify your doctor if there is any change in your medical condition     (cold, fever, infections).     Do not wear jewelry, make-up, hairpins, clips or nail polish.  Do not wear lotions, powders, or perfumes.   Do not shave 48 hours prior to surgery. Men may shave face and neck.  Do not bring valuables to the hospital.    [facility] is not responsible for any belongings or valuables.               Contacts, dentures or bridgework may not be worn into surgery.  Leave your suitcase in the car. After surgery it may be brought to your room.  For patients admitted to the hospital, discharge time is determined by your                treatment team.   Patients discharged the day of surgery will not be allowed to drive home.   Please read over the following fact sheets that you were given:   Surgical Site Infection Prevention   __X__ Take these medicines the morning of surgery with A SIP OF WATER:    1. AMLODIPINE  2.   3.   4.  5.  6.  ____ Fleet Enema (as directed)   __X__ Use CHG Soap as directed  ____ Use inhalers on the day of surgery  ____ Stop metformin 2 days prior to surgery    ____ Take 1/2 of usual insulin dose the night before surgery and none on the morning of surgery.   ____ Stop Coumadin/Plavix/aspirin on    __X__ Stop Anti-inflammatories on STOP MELOXICAM AND IBUPROFEN TODAY MAY USE TYLENOL FOR ACHES OR PAINS   ____ Stop supplements until after surgery.    ____ Bring C-Pap to the hospital.

## 2015-08-10 ENCOUNTER — Other Ambulatory Visit: Payer: Self-pay

## 2015-08-10 ENCOUNTER — Other Ambulatory Visit: Payer: BLUE CROSS/BLUE SHIELD

## 2015-08-10 ENCOUNTER — Telehealth: Payer: Self-pay

## 2015-08-10 DIAGNOSIS — R918 Other nonspecific abnormal finding of lung field: Secondary | ICD-10-CM

## 2015-08-10 LAB — ABO/RH: ABO/RH(D): O POS

## 2015-08-10 MED ORDER — MUPIROCIN CALCIUM 2 % NA OINT: 1.0000 "application " | TOPICAL_OINTMENT | Freq: Two times a day (BID) | NASAL | Status: DC

## 2015-08-10 MED ORDER — VANCOMYCIN HCL 10 G IV SOLR: 1000.0000 mg | Freq: Once | INTRAVENOUS | Status: DC

## 2015-08-10 NOTE — Pre-Procedure Instructions (Signed)
NOTIFIED AMBER AT DR OAKS OFFICE PT POSITIVE MRSA/STAPH PCR SCREEN. SHE WILL NOTIFY DR OAKS.

## 2015-08-10 NOTE — Telephone Encounter (Signed)
Spoke with Barbera Setters in Anesthesia regarding patient being positive for MRSA in PCR screen. Spoke with Dr. Genevive Bi. He has ordered Bactroban in each nare BID until surgery date and has also changed pre-op antibiotic order to Vancomycin 1gram.  Orders have been changed in computer and Bactroban has been sent to preferred pharmacy.  Call made to patient at this time. Left a message with Helene Kelp (patient's son) that medication is at pharmacy and directions were given on how to apply. He read back all instructions to me on the phone.

## 2015-08-10 NOTE — Pre-Procedure Instructions (Signed)
Positive MRSA & Staph results sent to Dr. Genevive Bi.  Asked if want any other antibiotics and/ or treatment?

## 2015-08-13 ENCOUNTER — Inpatient Hospital Stay
Admission: RE | Admit: 2015-08-13 | Discharge: 2015-08-18 | DRG: 165 | Disposition: A | Discharge status: Home / Self Care | Payer: BLUE CROSS/BLUE SHIELD | Source: Ambulatory Visit | Attending: Internal Medicine | Admitting: Internal Medicine

## 2015-08-13 ENCOUNTER — Inpatient Hospital Stay: Payer: BLUE CROSS/BLUE SHIELD

## 2015-08-13 ENCOUNTER — Inpatient Hospital Stay: Payer: BLUE CROSS/BLUE SHIELD | Admitting: Anesthesiology

## 2015-08-13 ENCOUNTER — Encounter
Admission: RE | Disposition: A | Discharge status: Home / Self Care | Payer: Self-pay | Source: Ambulatory Visit | Attending: Internal Medicine

## 2015-08-13 ENCOUNTER — Encounter: Payer: Self-pay | Admitting: Cardiothoracic Surgery

## 2015-08-13 DIAGNOSIS — Z86010 Personal history of colonic polyps: Secondary | ICD-10-CM

## 2015-08-13 DIAGNOSIS — I739 Peripheral vascular disease, unspecified: Secondary | ICD-10-CM | POA: Diagnosis present

## 2015-08-13 DIAGNOSIS — Z85528 Personal history of other malignant neoplasm of kidney: Secondary | ICD-10-CM

## 2015-08-13 DIAGNOSIS — C7802 Secondary malignant neoplasm of left lung: Principal | ICD-10-CM | POA: Diagnosis present

## 2015-08-13 DIAGNOSIS — Z9049 Acquired absence of other specified parts of digestive tract: Secondary | ICD-10-CM | POA: Diagnosis not present

## 2015-08-13 DIAGNOSIS — F419 Anxiety disorder, unspecified: Secondary | ICD-10-CM | POA: Diagnosis present

## 2015-08-13 DIAGNOSIS — J449 Chronic obstructive pulmonary disease, unspecified: Secondary | ICD-10-CM | POA: Diagnosis present

## 2015-08-13 DIAGNOSIS — Z8261 Family history of arthritis: Secondary | ICD-10-CM | POA: Diagnosis not present

## 2015-08-13 DIAGNOSIS — Z79899 Other long term (current) drug therapy: Secondary | ICD-10-CM | POA: Diagnosis not present

## 2015-08-13 DIAGNOSIS — Z905 Acquired absence of kidney: Secondary | ICD-10-CM

## 2015-08-13 DIAGNOSIS — N529 Male erectile dysfunction, unspecified: Secondary | ICD-10-CM | POA: Diagnosis present

## 2015-08-13 DIAGNOSIS — Z09 Encounter for follow-up examination after completed treatment for conditions other than malignant neoplasm: Secondary | ICD-10-CM

## 2015-08-13 DIAGNOSIS — R339 Retention of urine, unspecified: Secondary | ICD-10-CM | POA: Diagnosis present

## 2015-08-13 DIAGNOSIS — F17210 Nicotine dependence, cigarettes, uncomplicated: Secondary | ICD-10-CM | POA: Diagnosis present

## 2015-08-13 DIAGNOSIS — Z791 Long term (current) use of non-steroidal anti-inflammatories (NSAID): Secondary | ICD-10-CM

## 2015-08-13 DIAGNOSIS — R918 Other nonspecific abnormal finding of lung field: Secondary | ICD-10-CM | POA: Diagnosis present

## 2015-08-13 DIAGNOSIS — M349 Systemic sclerosis, unspecified: Secondary | ICD-10-CM | POA: Diagnosis present

## 2015-08-13 DIAGNOSIS — Z9889 Other specified postprocedural states: Secondary | ICD-10-CM

## 2015-08-13 HISTORY — DX: Essential (primary) hypertension: I10

## 2015-08-13 HISTORY — DX: Unspecified asthma, uncomplicated: J45.909

## 2015-08-13 HISTORY — DX: Other specified health status: Z78.9

## 2015-08-13 HISTORY — PX: VIDEO ASSISTED THORACOSCOPY (VATS)/THOROCOTOMY: SHX6173

## 2015-08-13 LAB — PREPARE RBC (CROSSMATCH)

## 2015-08-13 SURGERY — VIDEO ASSISTED THORACOSCOPY (VATS)/THOROCOTOMY
Anesthesia: Epidural | Laterality: Left | Wound class: Clean Contaminated

## 2015-08-13 MED ORDER — SCOPOLAMINE 1 MG/3DAYS TD PT72
1.0000 | MEDICATED_PATCH | Freq: Once | TRANSDERMAL | Status: DC
  Filled 2015-08-13: qty 1

## 2015-08-13 MED ORDER — FENTANYL CITRATE (PF) 100 MCG/2ML IJ SOLN
INTRAMUSCULAR | Status: DC | PRN
  Administered 2015-08-13 (×3): 50 ug via INTRAVENOUS
  Administered 2015-08-13: 100 ug via INTRAVENOUS

## 2015-08-13 MED ORDER — LIDOCAINE HCL (CARDIAC) 20 MG/ML IV SOLN
INTRAVENOUS | Status: DC | PRN
  Administered 2015-08-13: 60 mg via INTRAVENOUS

## 2015-08-13 MED ORDER — NALBUPHINE HCL 10 MG/ML IJ SOLN
5.0000 mg | Freq: Once | INTRAMUSCULAR | Status: DC | PRN
  Filled 2015-08-13: qty 0.5

## 2015-08-13 MED ORDER — FENTANYL 2.5 MCG/ML W/ROPIVACAINE 0.2% IN NS 100 ML EPIDURAL INFUSION (ARMC-ANES)
EPIDURAL | Status: AC
  Filled 2015-08-13: qty 100

## 2015-08-13 MED ORDER — MUPIROCIN 2 % EX OINT
1.0000 "application " | TOPICAL_OINTMENT | Freq: Once | CUTANEOUS | Status: AC
  Administered 2015-08-13: 1 via TOPICAL
  Filled 2015-08-13: qty 22

## 2015-08-13 MED ORDER — FENTANYL CITRATE (PF) 100 MCG/2ML IJ SOLN
INTRAMUSCULAR | Status: AC
  Filled 2015-08-13: qty 2

## 2015-08-13 MED ORDER — FAMOTIDINE 20 MG PO TABS
20.0000 mg | ORAL_TABLET | Freq: Once | ORAL | Status: AC
  Administered 2015-08-13: 20 mg via ORAL

## 2015-08-13 MED ORDER — VANCOMYCIN HCL IN DEXTROSE 1-5 GM/200ML-% IV SOLN
1000.0000 mg | Freq: Two times a day (BID) | INTRAVENOUS | Status: AC
  Administered 2015-08-13: 1000 mg via INTRAVENOUS
  Filled 2015-08-13: qty 200

## 2015-08-13 MED ORDER — EPHEDRINE SULFATE 50 MG/ML IJ SOLN
INTRAMUSCULAR | Status: DC | PRN
  Administered 2015-08-13 (×2): 10 mg via INTRAVENOUS
  Administered 2015-08-13 (×2): 5 mg via INTRAVENOUS
  Administered 2015-08-13 (×3): 10 mg via INTRAVENOUS

## 2015-08-13 MED ORDER — FENTANYL CITRATE (PF) 100 MCG/2ML IJ SOLN
25.0000 ug | INTRAMUSCULAR | Status: DC | PRN
  Administered 2015-08-13 (×4): 25 ug via INTRAVENOUS

## 2015-08-13 MED ORDER — ONDANSETRON HCL 4 MG/2ML IJ SOLN: 4.0000 mg | Freq: Once | INTRAMUSCULAR | Status: DC | PRN

## 2015-08-13 MED ORDER — MORPHINE SULFATE (PF) 2 MG/ML IV SOLN
2.0000 mg | INTRAVENOUS | Status: DC | PRN
  Administered 2015-08-13 – 2015-08-14 (×8): 2 mg via INTRAVENOUS
  Filled 2015-08-13 (×10): qty 1

## 2015-08-13 MED ORDER — DIPHENHYDRAMINE HCL 50 MG/ML IJ SOLN: 12.5000 mg | INTRAMUSCULAR | Status: DC | PRN

## 2015-08-13 MED ORDER — BUPIVACAINE LIPOSOME 1.3 % IJ SUSP
INTRAMUSCULAR | Status: DC | PRN
  Administered 2015-08-13: 50 mL

## 2015-08-13 MED ORDER — PROPOFOL 10 MG/ML IV BOLUS
INTRAVENOUS | Status: DC | PRN
  Administered 2015-08-13: 170 mg via INTRAVENOUS

## 2015-08-13 MED ORDER — MIDAZOLAM HCL 2 MG/2ML IJ SOLN
INTRAMUSCULAR | Status: DC | PRN
  Administered 2015-08-13: 2 mg via INTRAVENOUS

## 2015-08-13 MED ORDER — GLYCOPYRROLATE 0.2 MG/ML IJ SOLN
INTRAMUSCULAR | Status: DC | PRN
  Administered 2015-08-13: 0.1 mg via INTRAVENOUS

## 2015-08-13 MED ORDER — MEPERIDINE HCL 25 MG/ML IJ SOLN: 6.2500 mg | INTRAMUSCULAR | Status: DC | PRN

## 2015-08-13 MED ORDER — NALBUPHINE HCL 10 MG/ML IJ SOLN
5.0000 mg | INTRAMUSCULAR | Status: DC | PRN
  Filled 2015-08-13: qty 0.5

## 2015-08-13 MED ORDER — SUGAMMADEX SODIUM 500 MG/5ML IV SOLN
INTRAVENOUS | Status: DC | PRN
  Administered 2015-08-13: 150 mg via INTRAVENOUS

## 2015-08-13 MED ORDER — FENTANYL CITRATE (PF) 100 MCG/2ML IJ SOLN
25.0000 ug | INTRAMUSCULAR | Status: DC | PRN
  Administered 2015-08-13: 25 ug via INTRAVENOUS

## 2015-08-13 MED ORDER — FAMOTIDINE 20 MG PO TABS
ORAL_TABLET | ORAL | Status: AC
  Administered 2015-08-13: 20 mg via ORAL
  Filled 2015-08-13: qty 1

## 2015-08-13 MED ORDER — ONDANSETRON HCL 4 MG/2ML IJ SOLN: 4.0000 mg | Freq: Three times a day (TID) | INTRAMUSCULAR | Status: DC | PRN

## 2015-08-13 MED ORDER — SODIUM CHLORIDE 0.9% FLUSH
3.0000 mL | INTRAVENOUS | Status: DC | PRN
  Administered 2015-08-13: 3 mL via INTRAVENOUS
  Filled 2015-08-13: qty 3

## 2015-08-13 MED ORDER — NALOXONE HCL 2 MG/2ML IJ SOSY
1.0000 ug/kg/h | PREFILLED_SYRINGE | INTRAVENOUS | Status: DC | PRN
  Filled 2015-08-13: qty 2

## 2015-08-13 MED ORDER — PHENYLEPHRINE HCL 10 MG/ML IJ SOLN
INTRAMUSCULAR | Status: DC | PRN
  Administered 2015-08-13: 100 ug via INTRAVENOUS
  Administered 2015-08-13: 200 ug via INTRAVENOUS

## 2015-08-13 MED ORDER — LACTATED RINGERS IV SOLN
INTRAVENOUS | Status: DC
  Administered 2015-08-13 (×2): via INTRAVENOUS

## 2015-08-13 MED ORDER — FENTANYL 2.5 MCG/ML W/ROPIVACAINE 0.2% IN NS 100 ML EPIDURAL INFUSION (ARMC-ANES)
10.0000 mL/h | EPIDURAL | Status: DC
  Administered 2015-08-13 – 2015-08-15 (×4): 10 mL/h via EPIDURAL
  Filled 2015-08-13 (×9): qty 100

## 2015-08-13 MED ORDER — NICOTINE 14 MG/24HR TD PT24
14.0000 mg | MEDICATED_PATCH | Freq: Every day | TRANSDERMAL | Status: DC
  Administered 2015-08-13 – 2015-08-18 (×6): 14 mg via TRANSDERMAL
  Filled 2015-08-13 (×6): qty 1

## 2015-08-13 MED ORDER — NALBUPHINE HCL 10 MG/ML IJ SOLN
5.0000 mg | Freq: Once | INTRAMUSCULAR | Status: DC | PRN
Start: 2015-08-13 — End: 2015-08-16
  Filled 2015-08-13: qty 0.5

## 2015-08-13 MED ORDER — SODIUM CHLORIDE 0.9 % IJ SOLN
INTRAMUSCULAR | Status: AC
Start: 2015-08-13 — End: 2015-08-13
  Filled 2015-08-13: qty 50

## 2015-08-13 MED ORDER — SODIUM CHLORIDE FLUSH 0.9 % IV SOLN
INTRAVENOUS | Status: AC
  Administered 2015-08-13: 3 mL via INTRAVENOUS
  Filled 2015-08-13: qty 3

## 2015-08-13 MED ORDER — MUPIROCIN 2 % EX OINT
1.0000 "application " | TOPICAL_OINTMENT | Freq: Two times a day (BID) | CUTANEOUS | Status: DC
  Administered 2015-08-13 – 2015-08-18 (×10): 1 via TOPICAL
  Filled 2015-08-13: qty 22

## 2015-08-13 MED ORDER — BUPIVACAINE LIPOSOME 1.3 % IJ SUSP
INTRAMUSCULAR | Status: AC
  Filled 2015-08-13: qty 20

## 2015-08-13 MED ORDER — IBUPROFEN 600 MG PO TABS
600.0000 mg | ORAL_TABLET | Freq: Four times a day (QID) | ORAL | Status: DC | PRN
  Filled 2015-08-13: qty 1
  Filled 2015-08-13: qty 2

## 2015-08-13 MED ORDER — NALOXONE HCL 0.4 MG/ML IJ SOLN: 0.4000 mg | INTRAMUSCULAR | Status: DC | PRN

## 2015-08-13 MED ORDER — ONDANSETRON HCL 4 MG/2ML IJ SOLN
INTRAMUSCULAR | Status: DC | PRN
  Administered 2015-08-13: 4 mg via INTRAVENOUS

## 2015-08-13 MED ORDER — DEXAMETHASONE SODIUM PHOSPHATE 10 MG/ML IJ SOLN
INTRAMUSCULAR | Status: DC | PRN
  Administered 2015-08-13: 10 mg via INTRAVENOUS

## 2015-08-13 MED ORDER — ONDANSETRON HCL 4 MG/2ML IJ SOLN: 4.0000 mg | Freq: Four times a day (QID) | INTRAMUSCULAR | Status: DC | PRN

## 2015-08-13 MED ORDER — DEXTROSE-NACL 5-0.45 % IV SOLN
INTRAVENOUS | Status: DC
  Administered 2015-08-13: 1000 mL via INTRAVENOUS
  Administered 2015-08-14 – 2015-08-15 (×3): via INTRAVENOUS

## 2015-08-13 MED ORDER — AMLODIPINE BESYLATE 5 MG PO TABS
2.5000 mg | ORAL_TABLET | Freq: Every day | ORAL | Status: DC
  Filled 2015-08-13: qty 1

## 2015-08-13 MED ORDER — BUPIVACAINE HCL (PF) 0.5 % IJ SOLN
INTRAMUSCULAR | Status: DC | PRN
  Administered 2015-08-13: 4 mL
  Administered 2015-08-13: 5 mL
  Administered 2015-08-13: 4 mL

## 2015-08-13 MED ORDER — VANCOMYCIN HCL IN DEXTROSE 1-5 GM/200ML-% IV SOLN
1000.0000 mg | Freq: Once | INTRAVENOUS | Status: AC
  Administered 2015-08-13: 1000 mg via INTRAVENOUS

## 2015-08-13 MED ORDER — DIPHENHYDRAMINE HCL 25 MG PO CAPS: 25.0000 mg | ORAL_CAPSULE | ORAL | Status: DC | PRN

## 2015-08-13 MED ORDER — ALBUTEROL SULFATE (2.5 MG/3ML) 0.083% IN NEBU
2.5000 mg | INHALATION_SOLUTION | RESPIRATORY_TRACT | Status: DC
Start: 2015-08-13 — End: 2015-08-17
  Administered 2015-08-13 – 2015-08-17 (×14): 2.5 mg via RESPIRATORY_TRACT
  Filled 2015-08-13 (×17): qty 3

## 2015-08-13 MED ORDER — ROCURONIUM BROMIDE 100 MG/10ML IV SOLN
INTRAVENOUS | Status: DC | PRN
  Administered 2015-08-13: 20 mg via INTRAVENOUS
  Administered 2015-08-13: 30 mg via INTRAVENOUS
  Administered 2015-08-13: 20 mg via INTRAVENOUS
  Administered 2015-08-13: 50 mg via INTRAVENOUS
  Administered 2015-08-13: 20 mg via INTRAVENOUS

## 2015-08-13 MED ORDER — BISACODYL 5 MG PO TBEC
10.0000 mg | DELAYED_RELEASE_TABLET | Freq: Every day | ORAL | Status: DC
  Administered 2015-08-15 – 2015-08-18 (×4): 10 mg via ORAL
  Filled 2015-08-13 (×4): qty 2

## 2015-08-13 SURGICAL SUPPLY — 67 items
BENZOIN TINCTURE PRP APPL 2/3 (GAUZE/BANDAGES/DRESSINGS) ×2 IMPLANT
BRONCHOSCOPE PED SLIM DISP (MISCELLANEOUS) ×2 IMPLANT
CANISTER SUCT 1200ML W/VALVE (MISCELLANEOUS) ×2 IMPLANT
CATH THOR STR 28F  SOFT WA (CATHETERS) ×1
CATH THOR STR 28F SOFT WA (CATHETERS) ×1 IMPLANT
CATH THORACIC RT ANG 28FR SOFT (CATHETERS) ×2 IMPLANT
CATH TRAY 16F METER LATEX (MISCELLANEOUS) ×2 IMPLANT
CATH URET ROBINSON 16FR STRL (CATHETERS) ×2 IMPLANT
CHLORAPREP W/TINT 26ML (MISCELLANEOUS) ×4 IMPLANT
CNTNR SPEC 2.5X3XGRAD LEK (MISCELLANEOUS) ×4
CONN REDUCER 3/8X3/8X3/8Y (CONNECTOR) ×2
CONNECTOR REDUCER 3/8X3/8X3/8Y (CONNECTOR) ×1 IMPLANT
CONT SPEC 4OZ STER OR WHT (MISCELLANEOUS) ×4
CONTAINER SPEC 2.5X3XGRAD LEK (MISCELLANEOUS) ×4 IMPLANT
CUTTER ECHEON FLEX ENDO 45 340 (ENDOMECHANICALS) ×2 IMPLANT
DRAIN CHEST DRY SUCT SGL (MISCELLANEOUS) ×2 IMPLANT
DRAPE C-SECTION (MISCELLANEOUS) ×2 IMPLANT
DRAPE MAG INST 16X20 L/F (DRAPES) ×2 IMPLANT
DRESSING TELFA 4X3 1S ST N-ADH (GAUZE/BANDAGES/DRESSINGS) ×2 IMPLANT
DRSG OPSITE POSTOP 4X10 (GAUZE/BANDAGES/DRESSINGS) ×2 IMPLANT
DRSG OPSITE POSTOP 4X14 (GAUZE/BANDAGES/DRESSINGS) ×2 IMPLANT
DRSG OPSITE POSTOP 4X6 (GAUZE/BANDAGES/DRESSINGS) ×2 IMPLANT
DRSG OPSITE POSTOP 4X8 (GAUZE/BANDAGES/DRESSINGS) ×2 IMPLANT
DRSG TELFA 3X8 NADH (GAUZE/BANDAGES/DRESSINGS) ×2 IMPLANT
ELECT BLADE 6.5 EXT (BLADE) ×2 IMPLANT
ELECT CAUTERY BLADE TIP 2.5 (TIP) ×2
ELECT REM PT RETURN 9FT ADLT (ELECTROSURGICAL) ×2
ELECTRODE CAUTERY BLDE TIP 2.5 (TIP) ×1 IMPLANT
ELECTRODE REM PT RTRN 9FT ADLT (ELECTROSURGICAL) ×1 IMPLANT
GAUZE SPONGE 4X4 12PLY STRL (GAUZE/BANDAGES/DRESSINGS) ×2 IMPLANT
GLOVE SURG SYN 7.5  E (GLOVE) ×2
GLOVE SURG SYN 7.5 E (GLOVE) ×2 IMPLANT
GOWN STRL REUS W/ TWL LRG LVL3 (GOWN DISPOSABLE) ×3 IMPLANT
GOWN STRL REUS W/TWL LRG LVL3 (GOWN DISPOSABLE) ×3
KIT PLEURX DRAIN CATH 500ML (KITS) ×4 IMPLANT
KIT RM TURNOVER STRD PROC AR (KITS) ×2 IMPLANT
LABEL OR SOLS (LABEL) ×2 IMPLANT
LOOP RED MAXI  1X406MM (MISCELLANEOUS) ×1
LOOP VESSEL MAXI 1X406 RED (MISCELLANEOUS) ×1 IMPLANT
MARKER SKIN DUAL TIP RULER LAB (MISCELLANEOUS) ×4 IMPLANT
PACK BASIN MAJOR ARMC (MISCELLANEOUS) ×2 IMPLANT
RELOAD GOLD ECHELON 45 (STAPLE) ×16 IMPLANT
RELOAD STAPLER LINE PROX 30 GR (STAPLE) ×2 IMPLANT
SEALANT PROGEL (MISCELLANEOUS) ×2 IMPLANT
SPONGE KITTNER 5P (MISCELLANEOUS) ×10 IMPLANT
STAPLE RELOAD 2.5MM WHITE (STAPLE) ×4 IMPLANT
STAPLER RELOAD LINE PROX 30 GR (STAPLE) ×4
STAPLER SKIN PROX 35W (STAPLE) ×2 IMPLANT
STAPLER VASCULAR ECHELON 35 (CUTTER) ×2 IMPLANT
STRIP CLOSURE SKIN 1/2X4 (GAUZE/BANDAGES/DRESSINGS) ×2 IMPLANT
SUT ETHIBOND 4-0 (SUTURE) ×2 IMPLANT
SUT PROLENE 4 0 SH DA (SUTURE) ×2 IMPLANT
SUT PROLENE 5 0 RB 1 DA (SUTURE) ×2 IMPLANT
SUT SILK 0 (SUTURE) ×1
SUT SILK 0 30XBRD TIE 6 (SUTURE) ×1 IMPLANT
SUT SILK 1 SH (SUTURE) ×14 IMPLANT
SUT VIC AB 0 CT1 36 (SUTURE) ×4 IMPLANT
SUT VIC AB 2-0 CT1 27 (SUTURE) ×2
SUT VIC AB 2-0 CT1 TAPERPNT 27 (SUTURE) ×2 IMPLANT
SUT VICRYL 2 TP 1 (SUTURE) ×6 IMPLANT
SYR 10ML SLIP (SYRINGE) ×2 IMPLANT
SYR BULB IRRIG 60ML STRL (SYRINGE) ×2 IMPLANT
TAPE ADH 3 LX (MISCELLANEOUS) ×2 IMPLANT
TROCAR FLEXIPATH 20X80 (ENDOMECHANICALS) ×2 IMPLANT
TUBING CONNECTING 10 (TUBING) ×2 IMPLANT
WATER STERILE IRR 1000ML POUR (IV SOLUTION) ×2 IMPLANT
YANKAUER SUCT BULB TIP FLEX NO (MISCELLANEOUS) ×2 IMPLANT

## 2015-08-13 NOTE — Anesthesia Procedure Notes (Addendum)
Procedure Name: Intubation Date/Time: 08/13/2015 8:49 AM Performed by: Silvana Newness Pre-anesthesia Checklist: Patient identified, Emergency Drugs available, Suction available, Patient being monitored and Timeout performed Patient Re-evaluated:Patient Re-evaluated prior to inductionOxygen Delivery Method: Circle system utilized Preoxygenation: Pre-oxygenation with 100% oxygen Intubation Type: IV induction Ventilation: Mask ventilation without difficulty Laryngoscope Size: Mac and 4 Grade View: Grade I Endobronchial tube: Left, Double lumen EBT, EBT position confirmed by auscultation and EBT position confirmed by fiberoptic bronchoscope and 39 Fr Number of attempts: 1 Airway Equipment and Method: Rigid stylet Placement Confirmation: ETT inserted through vocal cords under direct vision,  positive ETCO2 and breath sounds checked- equal and bilateral Tube secured with: Tape Dental Injury: Teeth and Oropharynx as per pre-operative assessment  Comments: Verified able to put left lung down, after securing   Epidural Patient location during procedure: OR Start time: 08/13/2015 8:25 AM End time: 08/13/2015 8:38 AM  Staffing Anesthesiologist: Marline Backbone F Performed by: anesthesiologist   Preanesthetic Checklist Completed: patient identified, site marked, surgical consent, pre-op evaluation, timeout performed, IV checked, risks and benefits discussed, monitors and equipment checked and at surgeon's request  Epidural Patient position: sitting Prep: Betadine Patient monitoring: heart rate and blood pressure Approach: midline Location: thoracic (1-12) Injection technique: LOR air and LOR saline  Needle:  Needle type: Tuohy  Needle gauge: 18 G Needle length: 9 cm Needle insertion depth: 6 cm Catheter type: closed end flexible Catheter size: 20 Guage Catheter at skin depth: 11 cm  Additional Notes Reason for block:at surgeon's request

## 2015-08-13 NOTE — Anesthesia Preprocedure Evaluation (Addendum)
Anesthesia Evaluation  Patient identified by MRN, date of birth, ID band  Reviewed: Allergy & Precautions, NPO status , Patient's Chart, lab work & pertinent test results  Airway Mallampati: II       Dental  (+) Teeth Intact   Pulmonary shortness of breath, COPD, Current Smoker,    + rhonchi  + decreased breath sounds      Cardiovascular Exercise Tolerance: Good  Rhythm:Regular Rate:Normal     Neuro/Psych    GI/Hepatic negative GI ROS, Neg liver ROS,   Endo/Other    Renal/GU      Musculoskeletal   Abdominal   Peds negative pediatric ROS (+)  Hematology negative hematology ROS (+)   Anesthesia Other Findings   Reproductive/Obstetrics                            Anesthesia Physical Anesthesia Plan  ASA: III  Anesthesia Plan: General   Post-op Pain Management:    Induction: Intravenous  Airway Management Planned: Oral ETT  Additional Equipment:   Intra-op Plan:   Post-operative Plan: Extubation in OR  Informed Consent: I have reviewed the patients History and Physical, chart, labs and discussed the procedure including the risks, benefits and alternatives for the proposed anesthesia with the patient or authorized representative who has indicated his/her understanding and acceptance.     Plan Discussed with: CRNA  Anesthesia Plan Comments:         Anesthesia Quick Evaluation

## 2015-08-13 NOTE — H&P (View-Only) (Signed)
Patient ID: CODEN [place], male   DOB: December 09, 1955, 60 y.o.   MRN: [id number]  Chief Complaint  Patient presents with  . Follow-up    Lung Mass    HPI Thomas Le is a 60 y.o. male.  He saw Dr. Lattie Haw who did not recommend any percutaneous biopsy at this time. He did recommend surgical resection. The patient states that he would like to move forward with lung resection. He does continue to smoke however at least a half a pack of cigarettes. He does not complain of any shortness of breath.   Past Medical History  Diagnosis Date  . Arthritis   . Colitis     Had blood in his stool with this and treated in the hospital  . Diverticulitis   . Chronic bronchitis (HCC)   . Allergic rhinitis   . Kidney disease   . Genital herpes   . Colon polyps   . Anxiety   . Depression   . ED (erectile dysfunction)   . Colitis cystica profunda   . Occipital lymphadenopathy   . Renal cancer (HCC)   . Cancer [facility])     Past Surgical History  Procedure Laterality Date  . Kidney surgery Right 2012    Nephrectomy  . Partial colectomy  2012    For perforated colon related to diverticulitis- Dr. Egbert Garibaldi  . Appendectomy  1966  . Vasectomy  2000    Family History  Problem Relation Age of Onset  . Arthritis/Rheumatoid      Parent  . Heart disease      Grandparent  . Alcoholism      Other relative  . Arthritis Mother     Rhematoid    Social History Social History  Substance Use Topics  . Smoking status: Current Every Day Smoker -- 0.50 packs/day for 30 years    Types: Cigarettes  . Smokeless tobacco: Never Used  . Alcohol Use: 0.0 oz/week    0 Standard drinks or equivalent per week     Comment: 5 beers a night     No Known Allergies  Current Outpatient Prescriptions  Medication Sig Dispense Refill  . amLODipine (NORVASC) 2.5 MG tablet Take 1 tablet by mouth daily.    Marland Kitchen ibuprofen (ADVIL,MOTRIN) 200 MG tablet Take 1 tablet by mouth every 6 (six) hours as needed.    . loperamide  (IMODIUM A-D) 2 MG tablet Take 2 mg by mouth 4 (four) times daily as needed for diarrhea or loose stools.    . meloxicam (MOBIC) 7.5 MG tablet Take 1 tablet by mouth daily.    . nicotine (EQ NICOTINE) 21 mg/24hr patch Place 1 patch (21 mg total) onto the skin daily. 28 patch 0  . sildenafil (VIAGRA) 50 MG tablet Take 1 tablet (50 mg total) by mouth daily as needed for erectile dysfunction. 5 tablet 0   No current facility-administered medications for this visit.    Location, Quality, Duration, Severity, Timing, Context, Modifying Factors, Associated Signs and Symptoms.  Review of Systems A complete review of systems was asked and was negative except for the following positive findingsShortness of breath with exertion. Anxiety.  Blood pressure 135/78, pulse 58, temperature 98.1 F (36.7 C), temperature source Oral, height 6\' 1"  (1.854 m), weight 165 lb 12.8 oz (75.206 kg), SpO2 98 %.  Physical Exam CONSTITUTIONAL:  Pleasant, well-developed, well-nourished, and in no acute distress. EYES: Pupils equal and reactive to light, Sclera non-icteric EARS, NOSE, MOUTH AND THROAT:  The oropharynx was  clear.  Dentition is good repair.  Oral mucosa pink and moist. LYMPH NODES:  Lymph nodes in the neck and axillae were normal RESPIRATORY:  Lungs were clear.  Normal respiratory effort without pathologic use of accessory muscles of respiration CARDIOVASCULAR: Heart was regular without murmurs.  There were no carotid bruits. GI: The abdomen was soft, nontender, and nondistended. There were no palpable masses. There was no hepatosplenomegaly. There were normal bowel sounds in all quadrants. GU:   MUSCULOSKELETAL:  Normal muscle strength and tone.  No clubbing or cyanosis.   SKIN:  There were no pathologic skin lesions.  There were no nodules on palpation. NEUROLOGIC:  Sensation is normal.  Cranial nerves are grossly intact. PSYCH:  Oriented to person, place and time.  Mood and affect are normal.  Data  Reviewed   I have personally reviewed the patient's imaging and medical records.    Assessment    I did have the opportunity today to review his CT scans and PET scans. It's unclear whether or not this represents a bronchogenic carcinoma or metastatic disease. His pulmonary function tests are adequate for surgery.    Plan    We will plan on admitting the patient to the hospital for a left thoracotomy and left upper lobe resection. He understands that there is a small nodule in the right lung which required additional follow-up. All risks and benefits were discussed.    Hulda Marin, MD 08/07/2015, 12:37 PM

## 2015-08-13 NOTE — Transfer of Care (Signed)
Immediate Anesthesia Transfer of Care Note  Patient: Thomas Le  Procedure(s) Performed: Procedure(s): LEFT THOROCOTOMY WITH LEFT UPPER LOBECTOMY, PREOP BRONCHOSCOPY (Left)  Patient Location: PACU  Anesthesia Type:General  Level of Consciousness: awake, alert , oriented and patient cooperative  Airway & Oxygen Therapy: Patient Spontanous Breathing and Patient connected to face mask oxygen  Post-op Assessment: Report given to RN, Post -op Vital signs reviewed and stable and Patient moving all extremities X 4  Post vital signs: Reviewed and stable  Last Vitals:  Filed Vitals:   08/13/15 0710  BP: 131/76  Pulse: 54  Temp: 37 C  Resp: 16    Last Pain: There were no vitals filed for this visit.       Complications: No apparent anesthesia complications

## 2015-08-13 NOTE — Op Note (Signed)
08/13/2015  3:01 PM  PATIENT:  Thomas Le  60 y.o. male  PRE-OPERATIVE DIAGNOSIS:  Left upper lobe mass 3  POST-OPERATIVE DIAGNOSIS:  Carcinoma left upper lobe on frozen section  PROCEDURE:  Preoperative bronchoscopy to assess endobronchial anatomy; left thoracotomy with left upper lobectomy  SURGEON:  Surgeon(s) and Role:    * Nestor Lewandowsky, MD - Primary    * Hubbard Robinson, MD - Assisting  ASSISTANTS: Theodosia Blender physician's assistant student  ANESTHESIA: Gen.  INDICATIONS FOR PROCEDURE this patient is a 60 year old gentleman who has a history of renal cell carcinoma and underwent a CT scan several weeks ago for evaluation of cough. The CT scan confirmed the presence of multiple lesions within the left upper lobe. The largest lesion was considered to be either a primary lung cancer or metastatic disease. In addition there is a lesion in the right upper lobe. On the basis of this information a PET scan was obtained which was positive and the patient was offered the above-named procedure for definitive diagnosis and treatment.  DICTATION: The patient is brought to the operating suite and placed in the supine position. General endotracheal anesthesia was given with a double-lumen tube. Preoperative bronchoscopy was carried out. There is no evidence of endobronchial tumor. There was no evidence of purulent secretions. The endotracheal tube was found to be in good position within the left mainstem bronchus and the patient was therefore turned for a left thoracotomy. All pressure points were carefully padded. Patient was prepped and draped in usual sterile fashion.  A posterior lateral fifth interspace thoracotomy was performed. The latissimus muscle was divided but the serratus was spared. The chest was entered. The inferior pulmonary ligament was divided. A hand was then placed into the chest and there is no evidence of disease within the lower lobe but within the upper lobe there  were 2 lesions one more superficial in the lung and one deeper which are easily palpable. The third lesion was not palpable. We wedged the lesion from the left upper lobe for definitive diagnosis. This returned a malignant neoplasm not otherwise specified on frozen section. We therefore elected to proceed on with a left upper lobectomy.  The fissure between the upper and lower lobes was completed using electrocautery. The branches of the pulmonary artery were identified. There were 4 or 5 major branches of the pulmonary artery going to the upper lobe. The superior pulmonary vein was identified and there were 2 large branches one from the lingula and one from the remaining portion of the upper lobe. We began by encircling the pulmonary vein. The pulmonary arterial branches from the lingula and anterior segments were then taken with a vascular stapler. In order to provide better exposure to the remaining portions of the pulmonary artery the pulmonary vein was taken in 2 divisions. A vascular stapler was used to control these. This gave [place] better exposure to the upper lobe branches off the pulmonary artery. These were several in number and were taken again with a vascular stapler placed through our thoracoscopy port inferiorly. Once all the arterial and venous branches were divided the only remaining structure was the bronchus. The upper lobe bronchus was relatively short with a lingular and remaining portions of the upper lobe bifurcating early. For this reason the stapler was placed just at the bifurcation. The lower lobe ventilated quite nicely after securing the upper lobe with a stapling device TA-30. The stapler was then fired and the upper lobe bronchus was transected.  The specimen was passed off the field. The bronchus was checked at 30 cm water pressure and there is no air leak. Hemostasis was complete. We did use pro-gel and the cut surfaces of the lung. The chest was drained with 2 28 French chest tubes.  An angled placed posteriorly and a straight placed anteriorly.   The chest was then closed with #2 Vicryl pericostal sutures. The serratus fascia was approximated with 0 Vicryl. The serratus was allowed to return to its normal anatomic position. The latissimus muscle was then closed with #2 Vicryl. The subcutaneous tissues were closed with a few interrupted 2-0 Vicryl sutures and the skin with skin clips. The thoracoscopy port was closed over the posterior chest tube. The muscles approximated with running 0 Vicryl the subcutaneous tissues with 2-0 Vicryl and the skin with nylon. The chest tubes were secured with silk. Sterile dressings were then applied and the patient was then rolled in the supine position where he was extubated and taken to the recovery room in stable condition.    Nestor Lewandowsky, MD

## 2015-08-13 NOTE — Brief Op Note (Signed)
08/13/2015  1:26 PM  PATIENT:  Thomas Le  60 y.o. male  PRE-OPERATIVE DIAGNOSIS:  LUNG MASS  POST-OPERATIVE DIAGNOSIS:  LUNG MASS  PROCEDURE:  Procedure(s): LEFT THOROCOTOMY WITH LEFT UPPER LOBECTOMY, PREOP BRONCHOSCOPY (Left)  SURGEON:  Surgeon(s) and Role:    * Nestor Lewandowsky, MD - Primary    * Hubbard Robinson, MD - Assisting  PHYSICIAN ASSISTANT:   ASSISTANTS: Winfield Rast PAS   ANESTHESIA:   general  EBL:  Total I/O In: 1600 [I.V.:1600] Out: 300 [Urine:200; Blood:100]  BLOOD ADMINISTERED:none  DRAINS: 2 Chest Tube(s) in the plueral space   LOCAL MEDICATIONS USED:  MARCAINE     SPECIMEN:  Source of Specimen:  LUL  DISPOSITION OF SPECIMEN:  PATHOLOGY  COUNTS:  YES  TOURNIQUET:  * No tourniquets in log *  DICTATION: .Dragon Dictation  PLAN OF CARE: Admit to inpatient   PATIENT DISPOSITION:  PACU - hemodynamically stable.   Delay start of Pharmacological VTE agent (>24hrs) due to surgical blood loss or risk of bleeding: yes

## 2015-08-13 NOTE — Consult Note (Signed)
[facility] [place] [facility]   PATIENT NAME: Thomas Le    MR#:  [id number]  DATE OF BIRTH:  04-10-1955  DATE OF ADMISSION:  08/13/2015  PRIMARY CARE PHYSICIAN: [name], MD   CONSULT REQUESTING/REFERRING PHYSICIAN: Dr. Thelma Barge  REASON FOR CONSULT: COPD  CHIEF COMPLAINT:  No chief complaint on file.   HISTORY OF PRESENT ILLNESS:  Thomas Le  is a 60 y.o. male with a known history of COPD, tobacco use, renal cell carcinoma status post nephrectomy and recently diagnosed left upper lobe 2.5 cm lung mass admitted to ICU after having left thoracostomy and left upper lobe resection. Patient was recently seen by oncology and waiting for pathology results. Presently his main complaint is pain at the surgical site. Unable to take deep breaths because of that. He has an epidural analgesic. He does not use any inhalers at home. Continues to smoke half a pack a day. Does not complain of any significant shortness of breath on exertion at baseline. Afebrile. No lower extremity edema.  PAST MEDICAL HISTORY:   Past Medical History  Diagnosis Date  . Arthritis   . Colitis     Had blood in his stool with this and treated in the hospital  . Diverticulitis   . Chronic bronchitis (HCC)   . Allergic rhinitis   . Kidney disease   . Genital herpes   . Colon polyps   . Anxiety   . Depression   . ED (erectile dysfunction)   . Colitis cystica profunda   . Occipital lymphadenopathy   . Renal cancer (HCC)   . Peripheral vascular disease (HCC)   . Complication of anesthesia     when waking up get claustophobic with mask, anxiety, panic  . Shortness of breath dyspnea     PAST SURGICAL HISTOIRY:   Past Surgical History  Procedure Laterality Date  . Kidney surgery Right 2012    Nephrectomy  . Partial colectomy  2012    For perforated colon related to diverticulitis- Dr. Egbert Garibaldi  . Appendectomy  1966  . Vasectomy  2000  . Video assisted thoracoscopy  (vats)/thorocotomy Left 08/13/2015    Procedure: LEFT THOROCOTOMY WITH LEFT UPPER LOBECTOMY, PREOP BRONCHOSCOPY;  Surgeon: Hulda Marin, MD;  Location: [facility] ORS;  Service: General;  Laterality: Left;    SOCIAL HISTORY:   Social History  Substance Use Topics  . Smoking status: Current Every Day Smoker -- 0.50 packs/day for 30 years    Types: Cigarettes  . Smokeless tobacco: Never Used  . Alcohol Use: 0.0 oz/week    0 Standard drinks or equivalent per week     Comment: 5 beers a night     FAMILY HISTORY:   Family History  Problem Relation Age of Onset  . Arthritis/Rheumatoid      Parent  . Heart disease      Grandparent  . Alcoholism      Other relative  . Arthritis Mother     Rhematoid    DRUG ALLERGIES:  No Known Allergies  REVIEW OF SYSTEMS:   ROS  CONSTITUTIONAL: No fever, fatigue or weakness.  EYES: No blurred or double vision.  EARS, NOSE, AND THROAT: No tinnitus or ear pain.  RESPIRATORY: No cough, shortness of breath, wheezing or hemoptysis.  CARDIOVASCULAR: No  orthopnea, edema. Chest pain at surgical site GASTROINTESTINAL: No nausea, vomiting, diarrhea or abdominal pain.  GENITOURINARY: No dysuria, hematuria.  ENDOCRINE: No polyuria, nocturia,  HEMATOLOGY: No anemia, easy bruising or bleeding  SKIN: No rash or lesion. MUSCULOSKELETAL: No joint pain or arthritis.   NEUROLOGIC: No tingling, numbness, weakness.  PSYCHIATRY: No anxiety or depression.   MEDICATIONS AT HOME:   Prior to Admission medications   Medication Sig Start Date End Date Taking? Authorizing Provider  amLODipine (NORVASC) 2.5 MG tablet Take 1 tablet by mouth daily. 06/25/15 06/24/16 Yes Historical Provider, MD  ibuprofen (ADVIL,MOTRIN) 200 MG tablet Take 1 tablet by mouth every 6 (six) hours as needed.   Yes Historical Provider, MD  loperamide (IMODIUM A-D) 2 MG tablet Take 2 mg by mouth 4 (four) times daily as needed for diarrhea or loose stools.   Yes Historical Provider, MD  meloxicam  (MOBIC) 7.5 MG tablet Take 1 tablet by mouth daily. 06/25/15 06/24/16 Yes Historical Provider, MD  mupirocin nasal ointment (BACTROBAN NASAL) 2 % Place 1 application into the nose 2 (two) times daily. Use one-half of tube in each nostril twice daily for five (5) days. After application, press sides of nose together and gently massage. 08/10/15  Yes Hulda Marin, MD  sildenafil (VIAGRA) 50 MG tablet Take 1 tablet (50 mg total) by mouth daily as needed for erectile dysfunction. 06/14/15  Yes Glori Luis, MD  nicotine (EQ NICOTINE) 21 mg/24hr patch Place 1 patch (21 mg total) onto the skin daily. Patient not taking: Reported on 08/13/2015 08/03/15   Earna Coder, MD      VITAL SIGNS:  Blood pressure 113/64, pulse 73, temperature 96.7 F (35.9 C), temperature source Oral, resp. rate 17, height 6\' 1"  (1.854 m), weight 74.844 kg (165 lb), SpO2 100 %.  PHYSICAL EXAMINATION:  GENERAL:  60 y.o.-year-old patient lying in the bed with no acute distress.  EYES: Pupils equal, round, reactive to light and accommodation. No scleral icterus. Extraocular muscles intact.  HEENT: Head atraumatic, normocephalic. Oropharynx and nasopharynx clear.  NECK:  Supple, no jugular venous distention. No thyroid enlargement, no tenderness.  LUNGS: Good air entry on the right. Decreased air entry on the left side. 2 chest tubes in place. Scar of thoracostomy site. CARDIOVASCULAR: S1, S2 normal. No murmurs, rubs, or gallops.  ABDOMEN: Soft, nontender, nondistended. Bowel sounds present. No organomegaly or mass.  EXTREMITIES: No pedal edema, cyanosis, or clubbing.  NEUROLOGIC: Moves all 4 extremities symmetrically. PSYCHIATRIC: The patient is alert and oriented x 3.  SKIN: No obvious rash, lesion, or ulcer.   LABORATORY PANEL:   CBC No results for input(s): WBC, HGB, HCT, PLT in the last 168  hours. ------------------------------------------------------------------------------------------------------------------  Chemistries  No results for input(s): NA, K, CL, CO2, GLUCOSE, BUN, CREATININE, CALCIUM, MG, AST, ALT, ALKPHOS, BILITOT in the last 168 hours.  Invalid input(s): GFRCGP ------------------------------------------------------------------------------------------------------------------  Cardiac Enzymes No results for input(s): TROPONINI in the last 168 hours. ------------------------------------------------------------------------------------------------------------------  RADIOLOGY:  Dg Chest Port 1 View  08/13/2015  CLINICAL DATA:  60 year old male with a history of left lung mass. Status post left thoracotomy and left upper lobectomy. EXAM: PORTABLE CHEST 1 VIEW COMPARISON:  08/07/2015 FINDINGS: Interval surgical changes, with placement of 2 large poor left-sided thoracostomy tubes. First terminates at the left apex. The second terminates in the cardiophrenic sulcus. Surgical clips of the soft tissues. No visualized pneumothorax. Right to left mediastinal shift. Veiled opacity of the retrocardiac region. Right lung remains relatively well aerated. IMPRESSION: Early postoperative changes of left-sided VATS, left upper lobectomy, with 2 thoracostomy tubes and no visualized pneumothorax. Right lung relatively well aerated. Signed, Yvone Neu. Loreta Ave, DO [facility]  Electronically Signed   By: Gilmer Mor D.O.   On: 08/13/2015 14:18    EKG:   Orders placed or performed during the hospital encounter of 08/09/15  . EKG 12-Lead  . EKG 12-Lead    IMPRESSION AND PLAN:   * COPD No wheezing. Some trouble breathing due to pain from surgery. Nebulizers. Wean oxygen as tolerated.  * Left upper lobe Lung mass Status post left upper lobe resection. Pathology pending. Oncology follow-up as outpatient.  *  Scleroderma Patient has been using amlodipine for the past few weeks and responded well. Continue medication.  * DVT prophylaxis SCDs in place  Thank you for the consult will follow the patient while in hospital.  Management plans discussed with the patient and in agreement.  TOTAL TIME TAKING CARE OF THIS PATIENT: 35 minutes.    Milagros Loll R M.D on 08/13/2015 at 4:41 PM  Between 7am to 6pm - Pager - [phone]  After 6pm go to [email or web address] - password [email or web address] [facility]  [facility] [place] [facility]  Office  [phone]  CC: Primary care Physician: [name], MD  Note: This dictation was prepared with Dragon dictation along with smaller phrase technology. Any transcriptional errors that result from this process are unintentional.

## 2015-08-13 NOTE — Interval H&P Note (Signed)
History and Physical Interval Note:  08/13/2015 8:00 AM  Thomas Le  has presented today for surgery, with the diagnosis of LUNG MASS  The various methods of treatment have been discussed with the patient and family. After consideration of risks, benefits and other options for treatment, the patient has consented to  Procedure(s): LEFT THOROCOTOMY WITH LEFT UPPER LOBECTOMY, PREOP BRONCHOSCOPY (Left) as a surgical intervention .  The patient's history has been reviewed, patient examined, no change in status, stable for surgery.  I have reviewed the patient's chart and labs.  Questions were answered to the patient's satisfaction.     Nestor Lewandowsky

## 2015-08-14 ENCOUNTER — Inpatient Hospital Stay: Payer: BLUE CROSS/BLUE SHIELD

## 2015-08-14 LAB — COMPREHENSIVE METABOLIC PANEL
ALK PHOS: 47 U/L (ref 38–126)
ALT: 13 U/L — ABNORMAL LOW (ref 17–63)
AST: 31 U/L (ref 15–41)
Albumin: 3.3 g/dL — ABNORMAL LOW (ref 3.5–5.0)
Anion gap: 5 (ref 5–15)
BUN: 14 mg/dL (ref 6–20)
CALCIUM: 8 mg/dL — AB (ref 8.9–10.3)
CHLORIDE: 105 mmol/L (ref 101–111)
CO2: 27 mmol/L (ref 22–32)
Creatinine, Ser: 1 mg/dL (ref 0.61–1.24)
Glucose, Bld: 123 mg/dL — ABNORMAL HIGH (ref 65–99)
Potassium: 4.4 mmol/L (ref 3.5–5.1)
SODIUM: 137 mmol/L (ref 135–145)
Total Bilirubin: 0.7 mg/dL (ref 0.3–1.2)
Total Protein: 5.6 g/dL — ABNORMAL LOW (ref 6.5–8.1)

## 2015-08-14 LAB — CBC
HCT: 40.3 % (ref 40.0–52.0)
HEMOGLOBIN: 13.4 g/dL (ref 13.0–18.0)
MCH: 33.5 pg (ref 26.0–34.0)
MCHC: 33.3 g/dL (ref 32.0–36.0)
MCV: 100.6 fL — AB (ref 80.0–100.0)
Platelets: 182 10*3/uL (ref 150–440)
RBC: 4 MIL/uL — AB (ref 4.40–5.90)
RDW: 14 % (ref 11.5–14.5)
WBC: 11.6 10*3/uL — AB (ref 3.8–10.6)

## 2015-08-14 LAB — GLUCOSE, CAPILLARY: Glucose-Capillary: 110 mg/dL — ABNORMAL HIGH (ref 65–99)

## 2015-08-14 MED ORDER — CYCLOBENZAPRINE HCL 10 MG PO TABS
10.0000 mg | ORAL_TABLET | Freq: Three times a day (TID) | ORAL | Status: DC
  Administered 2015-08-14 – 2015-08-18 (×13): 10 mg via ORAL
  Filled 2015-08-14 (×13): qty 1

## 2015-08-14 MED ORDER — ZOLPIDEM TARTRATE 5 MG PO TABS
10.0000 mg | ORAL_TABLET | Freq: Every evening | ORAL | Status: DC | PRN
  Administered 2015-08-17: 10 mg via ORAL
  Filled 2015-08-14: qty 2

## 2015-08-14 MED ORDER — ALPRAZOLAM 0.5 MG PO TABS
0.5000 mg | ORAL_TABLET | Freq: Three times a day (TID) | ORAL | Status: DC | PRN
  Administered 2015-08-14 – 2015-08-15 (×2): 0.5 mg via ORAL
  Filled 2015-08-14 (×2): qty 1

## 2015-08-14 MED ORDER — KETOROLAC TROMETHAMINE 15 MG/ML IJ SOLN
30.0000 mg | Freq: Four times a day (QID) | INTRAMUSCULAR | Status: DC
  Administered 2015-08-14 – 2015-08-15 (×4): 30 mg via INTRAVENOUS
  Filled 2015-08-14 (×4): qty 2

## 2015-08-14 MED ORDER — OXYCODONE-ACETAMINOPHEN 5-325 MG PO TABS
1.0000 | ORAL_TABLET | ORAL | Status: DC | PRN
  Administered 2015-08-14 – 2015-08-15 (×6): 2 via ORAL
  Filled 2015-08-14 (×6): qty 2

## 2015-08-14 NOTE — Progress Notes (Signed)
He states he did not sleep all that well last evening. The bed is uncomfortable and he did have some discomfort. He denies any shortness of breath. He believes that his pain is now being adequately controlled with the intravenous morphine. It does not appear that the epidural has been all that effective.  His oxygen saturations are good on 2 L of nasal cannula. He's afebrile. His chest tube is draining serosanguineous to serous fluid. There is a small air leak with vigorous cough. His wounds are clean and dry.  He did have a chest x-ray made today which looks better than yesterday. There is good expansion of the left lung. Tubes are in good position.  We will advance his diet. We will remove his Foley catheter. We'll transfer the patient to the floor. I will decrease to suction to 10 cm of water suction.

## 2015-08-14 NOTE — Progress Notes (Signed)
[facility] [place] at [facility]                                                                                                                                                                                            Patient Demographics   Thomas Le, is a 60 y.o. male, DOB - 1955/11/13, WUJ:[id number]  Admit date - 08/13/2015   Admitting Physician Hulda Marin, MD  Outpatient Primary MD for the patient is [name], MD   LOS - 1  Subjective:Patient states that he is feeling anxious and is continuing to have a lot of pain. In the chest region. Breathing is stable     Review of Systems:   CONSTITUTIONAL: No documented fever. No fatigue, weakness. No weight gain, no weight loss.  EYES: No blurry or double vision.  ENT: No tinnitus. No postnasal drip. No redness of the oropharynx.  RESPIRATORY: No cough, no wheeze, no hemoptysis. No dyspnea.  CARDIOVASCULAR: Positive chest pain. No orthopnea. No palpitations. No syncope.  GASTROINTESTINAL: No nausea, no vomiting or diarrhea. No abdominal pain. No melena or hematochezia.  GENITOURINARY: No dysuria or hematuria.  ENDOCRINE: No polyuria or nocturia. No heat or cold intolerance.  HEMATOLOGY: No anemia. No bruising. No bleeding.  INTEGUMENTARY: No rashes. No lesions.  MUSCULOSKELETAL: No arthritis. No swelling. No gout.  NEUROLOGIC: No numbness, tingling, or ataxia. No seizure-type activity.  PSYCHIATRIC: Positive anxiety. Positive insomnia. No ADD.    Vitals:   Filed Vitals:   08/14/15 0845 08/14/15 0900 08/14/15 1000 08/14/15 1254  BP:  106/67 93/67 98/49   Pulse: 76   78  Temp:    98.3 F (36.8 C)  TempSrc:    Oral  Resp: 15 15 17 18   Height:      Weight:      SpO2: 100% 100% 100% 90%    Wt Readings from Last 3 Encounters:  08/13/15 74.844 kg (165 lb)  08/09/15 74.844 kg (165 lb)  08/07/15 75.206 kg (165 lb 12.8 oz)     Intake/Output Summary (Last 24 hours) at 08/14/15 1346 Last  data filed at 08/14/15 1100  Gross per 24 hour  Intake 1737.75 ml  Output   2065 ml  Net -327.25 ml    Physical Exam:   GENERAL: Pleasant-appearing in no apparent distress.  HEAD, EYES, EARS, NOSE AND THROAT: Atraumatic, normocephalic. Extraocular muscles are intact. Pupils equal and reactive to light. Sclerae anicteric. No conjunctival injection. No oro-pharyngeal erythema.  NECK: Supple. There is no jugular venous distention. No bruits, no lymphadenopathy, no thyromegaly.  HEART: Regular rate and rhythm,. No murmurs, no rubs, no clicks.  LUNGS: Clear to auscultation bilaterally. No rales or rhonchi. No wheezes.  ABDOMEN: Soft, flat, nontender, nondistended. Has good bowel sounds. No hepatosplenomegaly appreciated.  EXTREMITIES: No evidence of any cyanosis, clubbing, or peripheral edema.  +2 pedal and radial pulses bilaterally.  NEUROLOGIC: The patient is alert, awake, and oriented x3 with no focal motor or sensory deficits appreciated bilaterally.  SKIN: Moist and warm with no rashes appreciated.  Psych: Not anxious, depressed LN: No inguinal LN enlargement    Antibiotics   Anti-infectives    Start     Dose/Rate Route Frequency Ordered Stop   08/13/15 1530  vancomycin (VANCOCIN) IVPB 1000 mg/200 mL premix     1,000 mg 200 mL/hr over 60 Minutes Intravenous Every 12 hours 08/13/15 1528 08/13/15 1716   08/13/15 0645  vancomycin (VANCOCIN) IVPB 1000 mg/200 mL premix     1,000 mg 200 mL/hr over 60 Minutes Intravenous  Once 08/13/15 0638 08/13/15 0849      Medications   Scheduled Meds: . albuterol  2.5 mg Nebulization Q4H while awake  . amLODipine  2.5 mg Oral Daily  . bisacodyl  10 mg Oral Daily  . cyclobenzaprine  10 mg Oral TID  . ketorolac  30 mg Intravenous Q6H  . mupirocin ointment  1 application Topical BID  . nicotine  14 mg Transdermal Daily   Continuous Infusions: . dextrose 5 % and 0.45% NaCl 75 mL/hr at 08/13/15 1800  . fentanyl 2.5 mcg/ml w/ropivacaine 0.2% in  normal saline 100 mL EPIDURAL Infusion 10 mL/hr (08/13/15 2142)  . naLOXone [facility]) adult infusion for PRURITIS     PRN Meds:.ALPRAZolam, diphenhydrAMINE **OR** diphenhydrAMINE, ibuprofen, nalbuphine **OR** nalbuphine, nalbuphine **OR** nalbuphine, naLOXone (NARCAN) adult infusion for PRURITIS, naloxone **AND** sodium chloride flush, ondansetron (ZOFRAN) IV, oxyCODONE-acetaminophen, zolpidem   Data Review:   Micro Results Recent Results (from the past 240 hour(s))  Surgical pcr screen     Status: Abnormal   Collection Time: 08/09/15  3:18 PM  Result Value Ref Range Status   MRSA, PCR POSITIVE (A) NEGATIVE Final    Comment: CRITICAL RESULT CALLED TO, READ BACK BY AND VERIFIED WITH: MINDY HUFFINES 08/09/15 1740 VKB    Staphylococcus aureus POSITIVE (A) NEGATIVE Final    Radiology Reports Dg Chest 2 View  08/07/2015  CLINICAL DATA:  Lung cancer, smoker.  Preoperative for surgery. EXAM: CHEST  2 VIEW COMPARISON:  PET-CT dated 07/12/2015. FINDINGS: Heart size is normal. Left upper lobe nodule measures approximately 2 cm greatest dimension, more definitively measured on recent PET-CT in 2.5 x 1.7 cm, compatible with given history of lung cancer. Lungs otherwise clear. No pleural effusion seen. Osseous structures about the chest are unremarkable. IMPRESSION: 1. Left upper lobe pulmonary nodule, measured more definitively on recent PET-CT at 2.5 x 1.7 cm, compatible with given history of lung cancer. 2. No other acute/significant findings. Electronically Signed   By: Bary Richard M.D.   On: 08/07/2015 13:35   Ct Head W Wo Contrast  07/20/2015  CLINICAL DATA:  Left upper lobe lung nodule.  Cancer staging. EXAM: CT HEAD WITHOUT AND WITH CONTRAST TECHNIQUE: Contiguous axial images were obtained from the base of the skull through the vertex without and with intravenous contrast CONTRAST:  75mL ISOVUE-300 IOPAMIDOL (ISOVUE-300) INJECTION 61% COMPARISON:  None. FINDINGS: There is no evidence of acute  cortical infarct, intracranial hemorrhage, mass, midline shift, or extra-axial fluid collection. Ventricles and sulci are normal for age. There is a 6 x 4 mm irregular focus of enhancement versus artifact  involving right frontal cortex on a single image (series 3, image 17). No other enhancing brain lesions are identified. An incidental developmental venous anomaly is noted in the right centrum semiovale. IMPRESSION: No definite intracranial metastatic disease identified. Questionable 6 x 4 mm enhancing lesion versus artifact in the right frontal cortex, for which a tiny metastasis is not excluded. Attention on follow-up. Electronically Signed   By: Sebastian Ache M.D.   On: 07/20/2015 13:54   Dg Chest Port 1 View  08/14/2015  CLINICAL DATA:  60 year old male status post recent left thoracotomy and left upper lobectomy. EXAM: PORTABLE CHEST 1 VIEW COMPARISON:  Chest radiograph dated 08/13/2015 FINDINGS: There are two left-sided chest tubes in similar position as the prior study. There has been interval improvement of the aeration of the left lung compared to the prior study. An area of hazy density at the left lung base likely represent atelectatic changes. The right lung is clear. There is no pleural effusion. No pneumothorax identified. The cardiac silhouette is within normal limits. No acute osseous pathology. Postsurgical changes of the left lateral thoracic wall. IMPRESSION: Postsurgical changes of the left-sided left upper lobectomy with stable appearance of left-sided chest tubes. Interval improvement of aeration of the left lung. No pneumothorax. Electronically Signed   By: Elgie Collard M.D.   On: 08/14/2015 06:22   Dg Chest Port 1 View  08/13/2015  CLINICAL DATA:  60 year old male with a history of left lung mass. Status post left thoracotomy and left upper lobectomy. EXAM: PORTABLE CHEST 1 VIEW COMPARISON:  08/07/2015 FINDINGS: Interval surgical changes, with placement of 2 large poor left-sided  thoracostomy tubes. First terminates at the left apex. The second terminates in the cardiophrenic sulcus. Surgical clips of the soft tissues. No visualized pneumothorax. Right to left mediastinal shift. Veiled opacity of the retrocardiac region. Right lung remains relatively well aerated. IMPRESSION: Early postoperative changes of left-sided VATS, left upper lobectomy, with 2 thoracostomy tubes and no visualized pneumothorax. Right lung relatively well aerated. Signed, Yvone Neu. Loreta Ave, DO [facility] Radiology Electronically Signed   By: Gilmer Mor D.O.   On: 08/13/2015 14:18     CBC  Recent Labs Lab 08/14/15 0420  WBC 11.6*  HGB 13.4  HCT 40.3  PLT 182  MCV 100.6*  MCH 33.5  MCHC 33.3  RDW 14.0    Chemistries   Recent Labs Lab 08/14/15 0420  NA 137  K 4.4  CL 105  CO2 27  GLUCOSE 123*  BUN 14  CREATININE 1.00  CALCIUM 8.0*  AST 31  ALT 13*  ALKPHOS 47  BILITOT 0.7   ------------------------------------------------------------------------------------------------------------------ estimated creatinine clearance is 82.1 mL/min (by C-G formula based on Cr of 1). ------------------------------------------------------------------------------------------------------------------ No results for input(s): HGBA1C in the last 72 hours. ------------------------------------------------------------------------------------------------------------------ No results for input(s): CHOL, HDL, LDLCALC, TRIG, CHOLHDL, LDLDIRECT in the last 72 hours. ------------------------------------------------------------------------------------------------------------------ No results for input(s): TSH, T4TOTAL, T3FREE, THYROIDAB in the last 72 hours.  Invalid input(s): FREET3 ------------------------------------------------------------------------------------------------------------------ No results for input(s): VITAMINB12, FOLATE, FERRITIN, TIBC,  IRON, RETICCTPCT in the last 72 hours.  Coagulation profile No results for input(s): INR, PROTIME in the last 168 hours.  No results for input(s): DDIMER in the last 72 hours.  Cardiac Enzymes No results for input(s): CKMB, TROPONINI, MYOGLOBIN in the last 168 hours.  Invalid input(s): CK ------------------------------------------------------------------------------------------------------------------ Invalid input(s): POCBNP    Assessment & Plan   * COPD No evidence of exasperation Continue nebulizers when necessary   * Left  upper lobe Lung mass Status post left upper lobe resection. Oncology follow-up as outpatient.  * Scleroderma Continue  amlodipine    * anxiety I will add xananx  Ambient to help him sleep  * DVT prophylaxis SCDs in place      Code Status Orders        Start     Ordered   08/13/15 1529  Full code   Continuous     08/13/15 1528    Code Status History    Date Active Date Inactive Code Status Order ID Comments User Context   This patient has a current code status but no historical code status.    Advance Directive Documentation        Most Recent Value   Type of Advance Directive  Healthcare Power of Attorney, Living will   Pre-existing out of facility DNR order (yellow form or pink MOST form)     "MOST" Form in Place?                DVT Prophylaxis  scd's  Lab Results  Component Value Date   PLT 182 08/14/2015     Time Spent in minutes    Greater than 50% of time spent in care coordination and counseling patient regarding the condition and plan of care.   Auburn Bilberry M.D on 08/14/2015 at 1:46 PM  Between 7am to 6pm - Pager - [phone]  After 6pm go to [email or web address] - password [email or web address] [facility]  [facility]   Office  [phone]

## 2015-08-14 NOTE — Evaluation (Signed)
Physical Therapy Evaluation Patient Details Name: Thomas Le MRN: [id number] DOB: [birth year] Today's Date: 08/14/2015   History of Present Illness  Pt underwent left thoracotomy with left upper lobectomy and is POD#1 at time of PT evaluation. Pt denies falls in the last 12 months. Reports fully independent function prior to surgery.  Clinical Impression  Pt demonstrates some mild instability with ambulation but he relates to pain medication. He ambulates with guarded steps initially but is slowly able to improve speed and step length. Pt is anxious during ambulation but gradually improves with his confidence. LE strength appears WFL with MMT. UE strength testing deferred secondary to post-operative pain. Charge nurse notified of patient's frustrations with perceived lack of attention from RN. Charge also notified that epidural is hanging on same IV pole as fluids. Pt should be fine to discharge home with wife after acute care stay. Do not anticipate further PT needs after discharge. Would benefit from rolling walker to aid in stability due to pain meds and increased anxiety with ambulation. Pt will benefit from skilled PT services to address deficits in strength, balance, and mobility in order to return to full function at home.     Follow Up Recommendations No PT follow up    Equipment Recommendations  Rolling walker with 5" wheels    Recommendations for Other Services       Precautions / Restrictions Precautions Precautions: None Restrictions Weight Bearing Restrictions: No      Mobility  Bed Mobility Overal bed mobility: Independent             General bed mobility comments: Pt demonstrates increase in time required to perform bed mobility due to pain however able to do so without UE support and without assist from therapist  Transfers Overall transfer level: Needs assistance Equipment used: Rolling walker (2 wheeled) Transfers: Sit to/from Stand Sit to Stand: Min  guard         General transfer comment: Pt demonstrates good speed and sequencing with sit to stand transfers. Reports feeling slightly "whoozy" from pain medication and does demonstrate mild increase in trunk sway but able to self correct  Ambulation/Gait Ambulation/Gait assistance: Min guard Ambulation Distance (Feet): 220 Feet Assistive device: Rolling walker (2 wheeled) Gait Pattern/deviations: Decreased step length - right;Decreased step length - left Gait velocity: Decreased but functional for limited household ambulation   General Gait Details: Pt ambulates with slow guarded steps initially which improve with ambulation distance. Cues for upright posture and decreaseed UE support in standing. Fatigue monitored throughout ambulation and pt denies DOE during ambulation. He is steady with bilateral UE support but does rely heavily on UE for improved balance. Pt provided 2 standing rest breaks in order to relax arms.  Stairs            Wheelchair Mobility    Modified Rankin (Stroke Patients Only)       Balance Overall balance assessment: No apparent balance deficits (not formally assessed) (Slight increase in sway in standing secondary to pain meds)                                           Pertinent Vitals/Pain Pain Assessment: 0-10 Pain Score: 4  Pain Location: L "lung" with deep breaths and incision site of chest tube Pain Descriptors / Indicators: Sharp Pain Intervention(s): Monitored during session;Premedicated before session;Other (comment) (epidural in place)  Home Living Family/patient expects to be discharged to:: Private residence Living Arrangements: Spouse/significant other Available Help at Discharge: Family Type of Home: House Home Access: Stairs to enter Entrance Stairs-Rails: None Entrance Stairs-Number of Steps: 2 Home Layout: One level Home Equipment: Cane - single point;Shower seat;Grab bars - toilet      Prior Function  Level of Independence: Independent         Comments: Pt reports intermittent SOB in the last year. Otherwise fully independent community ambulator without assistive device. Drives     Hand Dominance   Dominant Hand: Right    Extremity/Trunk Assessment   Upper Extremity Assessment: Overall WFL for tasks assessed (Deferred MMT due to chest tube and surgical incisions)           Lower Extremity Assessment: Overall WFL for tasks assessed;LLE deficits/detail   LLE Deficits / Details: Pt reports some L thigh numbness s/p epidural which has improved since yesterday.     Communication   Communication: No difficulties  Cognition Arousal/Alertness: Awake/alert Behavior During Therapy: WFL for tasks assessed/performed Overall Cognitive Status: Within Functional Limits for tasks assessed                      General Comments      Exercises        Assessment/Plan    PT Assessment Patient needs continued PT services  PT Diagnosis Difficulty walking;Acute pain   PT Problem List Decreased activity tolerance;Cardiopulmonary status limiting activity;Pain  PT Treatment Interventions DME instruction;Gait training;Stair training;Therapeutic activities;Therapeutic exercise;Patient/family education   PT Goals (Current goals can be found in the Care Plan section) Acute Rehab PT Goals Patient Stated Goal: Return to prior level of function at home PT Goal Formulation: With patient Time For Goal Achievement: 08/28/15 Potential to Achieve Goals: Good    Frequency Min 2X/week   Barriers to discharge        Co-evaluation               End of Session Equipment Utilized During Treatment: Other (comment) (No gait belt due to surgical sites) Activity Tolerance: Patient tolerated treatment well Patient left: in chair;with call bell/phone within reach Nurse Communication: Mobility status;Other (comment) (RN observed ambulation in hall. )         Time: 4098-1191 PT  Time Calculation (min) (ACUTE ONLY): 36 min   Charges:   PT Evaluation $PT Eval Moderate Complexity: 1 Procedure PT Treatments $Gait Training: 8-22 mins   PT G Codes:       Sharalyn Ink Huprich PT, DPT   Huprich,Jason 08/14/2015, 5:07 PM

## 2015-08-14 NOTE — Anesthesia Postprocedure Evaluation (Signed)
Anesthesia Post Note  Patient: Thomas Le  Procedure(s) Performed: Procedure(s) (LRB): LEFT THOROCOTOMY WITH LEFT UPPER LOBECTOMY, PREOP BRONCHOSCOPY (Left)  Patient location during evaluation: ICU Anesthesia Type: Epidural and General Level of consciousness: awake and alert Pain management: pain level not controlled Vital Signs Assessment: post-procedure vital signs reviewed and stable Respiratory status: spontaneous breathing Cardiovascular status: stable Postop Assessment: no headache Anesthetic complications: no    Last Vitals:  Filed Vitals:   08/14/15 0600 08/14/15 0700  BP: 110/64 104/56  Pulse: 69 72  Temp:    Resp: 13 11    Last Pain:  Filed Vitals:   08/14/15 0717  PainSc: 6                  Dugan Vanhoesen,  Clearnce Sorrel

## 2015-08-14 NOTE — Anesthesia Post-op Follow-up Note (Signed)
  Anesthesia Pain Follow-up Note  Patient: Thomas Le  Day #: 1  Date of Follow-up: 08/14/2015 Time: 7:37 AM  Last Vitals:  Filed Vitals:   08/14/15 0600 08/14/15 0700  BP: 110/64 104/56  Pulse: 69 72  Temp:    Resp: 13 11    Level of Consciousness: alert  Pain: moderate   Side Effects:None  Catheter Site Exam:clean, dry  Epidural / Intrathecal    Start     Dose/Rate Route Frequency Ordered Stop   08/13/15 1315  fentaNYL 2.5 mcg/ml w/ropivacaine 0.2% (preservative free) in normal saline 100 mL EPIDURAL Infusion in 150 ml Intravia Bag     10 mL/hr 10 mL/hr  Epidural Continuous 08/13/15 1310         Plan: Continue current therapy    Estill Batten

## 2015-08-15 ENCOUNTER — Encounter: Payer: Self-pay | Admitting: Certified Registered Nurse Anesthetist

## 2015-08-15 ENCOUNTER — Inpatient Hospital Stay: Payer: BLUE CROSS/BLUE SHIELD

## 2015-08-15 LAB — TYPE AND SCREEN
ABO/RH(D): O POS
ANTIBODY SCREEN: NEGATIVE
UNIT DIVISION: 0
Unit division: 0

## 2015-08-15 LAB — PREPARE RBC (CROSSMATCH)

## 2015-08-15 MED ORDER — TAMSULOSIN HCL 0.4 MG PO CAPS
0.4000 mg | ORAL_CAPSULE | Freq: Every day | ORAL | Status: DC
  Administered 2015-08-15 – 2015-08-18 (×4): 0.4 mg via ORAL
  Filled 2015-08-15 (×4): qty 1

## 2015-08-15 MED ORDER — MORPHINE SULFATE (PF) 2 MG/ML IV SOLN
1.0000 mg | INTRAVENOUS | Status: DC | PRN
  Administered 2015-08-15 – 2015-08-17 (×7): 2 mg via INTRAVENOUS
  Filled 2015-08-15 (×8): qty 1

## 2015-08-15 MED ORDER — OXYCODONE-ACETAMINOPHEN 7.5-325 MG PO TABS
1.0000 | ORAL_TABLET | ORAL | Status: DC | PRN
  Administered 2015-08-15 – 2015-08-16 (×3): 2 via ORAL
  Administered 2015-08-16: 1 via ORAL
  Administered 2015-08-16 (×2): 2 via ORAL
  Administered 2015-08-17: 1 via ORAL
  Administered 2015-08-17 – 2015-08-18 (×6): 2 via ORAL
  Filled 2015-08-15 (×7): qty 2
  Filled 2015-08-15: qty 1
  Filled 2015-08-15 (×6): qty 2

## 2015-08-15 MED ORDER — OXYCODONE-ACETAMINOPHEN 7.5-325 MG PO TABS
1.0000 | ORAL_TABLET | ORAL | Status: DC | PRN
  Administered 2015-08-15: 2 via ORAL
  Filled 2015-08-15: qty 2

## 2015-08-15 MED ORDER — KETOROLAC TROMETHAMINE 15 MG/ML IJ SOLN
15.0000 mg | Freq: Four times a day (QID) | INTRAMUSCULAR | Status: DC
  Administered 2015-08-15 – 2015-08-17 (×8): 15 mg via INTRAVENOUS
  Filled 2015-08-15 (×8): qty 1

## 2015-08-15 NOTE — Progress Notes (Signed)
[facility] -  at [facility]                                                                                                                                                                                            Patient Demographics   Thomas Le, is a 60 y.o. male, DOB - 08-08-1955, GBT:[id number]  Admit date - 08/13/2015   Admitting Physician Hulda Marin, MD  Outpatient Primary MD for the patient is [name], MD   LOS - 2  Subjective:Feeling better pain under control Had issues with urinary retention    Review of Systems:   CONSTITUTIONAL: No documented fever. No fatigue, weakness. No weight gain, no weight loss.  EYES: No blurry or double vision.  ENT: No tinnitus. No postnasal drip. No redness of the oropharynx.  RESPIRATORY: No cough, no wheeze, no hemoptysis. No dyspnea.  CARDIOVASCULAR: Positive chest pain. No orthopnea. No palpitations. No syncope.  GASTROINTESTINAL: No nausea, no vomiting or diarrhea. No abdominal pain. No melena or hematochezia.  GENITOURINARY: No dysuria or hematuria.  ENDOCRINE: No polyuria or nocturia. No heat or cold intolerance.  HEMATOLOGY: No anemia. No bruising. No bleeding.  INTEGUMENTARY: No rashes. No lesions.  MUSCULOSKELETAL: No arthritis. No swelling. No gout.  NEUROLOGIC: No numbness, tingling, or ataxia. No seizure-type activity.  PSYCHIATRIC: Positive anxiety. Positive insomnia. No ADD.    Vitals:   Filed Vitals:   08/14/15 2213 08/15/15 0512 08/15/15 0525 08/15/15 1143  BP: 105/52  104/61 109/55  Pulse: 80 75 69 77  Temp: 98.3 F (36.8 C)  98.1 F (36.7 C) 98.2 F (36.8 C)  TempSrc: Oral  Oral   Resp: 20  20   Height:      Weight:      SpO2: 99% 95% 94% 99%    Wt Readings from Last 3 Encounters:  08/13/15 74.844 kg (165 lb)  08/09/15 74.844 kg (165 lb)  08/07/15 75.206 kg (165 lb 12.8 oz)     Intake/Output Summary (Last 24 hours) at 08/15/15 1216 Last data filed at  08/15/15 7106  Gross per 24 hour  Intake   1948 ml  Output    940 ml  Net   1008 ml    Physical Exam:   GENERAL: Pleasant-appearing in no apparent distress.  HEAD, EYES, EARS, NOSE AND THROAT: Atraumatic, normocephalic. Extraocular muscles are intact. Pupils equal and reactive to light. Sclerae anicteric. No conjunctival injection. No oro-pharyngeal erythema.  NECK: Supple. There is no jugular venous distention. No bruits, no lymphadenopathy, no thyromegaly.  HEART: Regular rate and rhythm,. No murmurs, no rubs, no clicks.  LUNGS: Clear  to auscultation bilaterally. No rales or rhonchi. No wheezes.  ABDOMEN: Soft, flat, nontender, nondistended. Has good bowel sounds. No hepatosplenomegaly appreciated.  EXTREMITIES: No evidence of any cyanosis, clubbing, or peripheral edema.  +2 pedal and radial pulses bilaterally.  NEUROLOGIC: The patient is alert, awake, and oriented x3 with no focal motor or sensory deficits appreciated bilaterally.  SKIN: Moist and warm with no rashes appreciated.  Psych: Not anxious, depressed LN: No inguinal LN enlargement    Antibiotics   Anti-infectives    Start     Dose/Rate Route Frequency Ordered Stop   08/13/15 1530  vancomycin (VANCOCIN) IVPB 1000 mg/200 mL premix     1,000 mg 200 mL/hr over 60 Minutes Intravenous Every 12 hours 08/13/15 1528 08/13/15 1716   08/13/15 0645  vancomycin (VANCOCIN) IVPB 1000 mg/200 mL premix     1,000 mg 200 mL/hr over 60 Minutes Intravenous  Once 08/13/15 0638 08/13/15 0849      Medications   Scheduled Meds: . albuterol  2.5 mg Nebulization Q4H while awake  . bisacodyl  10 mg Oral Daily  . cyclobenzaprine  10 mg Oral TID  . ketorolac  15 mg Intravenous Q6H  . mupirocin ointment  1 application Topical BID  . nicotine  14 mg Transdermal Daily  . tamsulosin  0.4 mg Oral Daily   Continuous Infusions: . naLOXone (NARCAN) adult infusion for PRURITIS     PRN Meds:.ALPRAZolam, diphenhydrAMINE **OR** diphenhydrAMINE,  morphine injection, nalbuphine **OR** nalbuphine, nalbuphine **OR** nalbuphine, naLOXone (NARCAN) adult infusion for PRURITIS, naloxone **AND** sodium chloride flush, ondansetron (ZOFRAN) IV, oxyCODONE-acetaminophen, zolpidem   Data Review:   Micro Results Recent Results (from the past 240 hour(s))  Surgical pcr screen     Status: Abnormal   Collection Time: 08/09/15  3:18 PM  Result Value Ref Range Status   MRSA, PCR POSITIVE (A) NEGATIVE Final    Comment: CRITICAL RESULT CALLED TO, READ BACK BY AND VERIFIED WITH: MINDY HUFFINES 08/09/15 1740 VKB    Staphylococcus aureus POSITIVE (A) NEGATIVE Final    Radiology Reports Dg Chest 2 View  08/07/2015  CLINICAL DATA:  Lung cancer, smoker.  Preoperative for surgery. EXAM: CHEST  2 VIEW COMPARISON:  PET-CT dated 07/12/2015. FINDINGS: Heart size is normal. Left upper lobe nodule measures approximately 2 cm greatest dimension, more definitively measured on recent PET-CT in 2.5 x 1.7 cm, compatible with given history of lung cancer. Lungs otherwise clear. No pleural effusion seen. Osseous structures about the chest are unremarkable. IMPRESSION: 1. Left upper lobe pulmonary nodule, measured more definitively on recent PET-CT at 2.5 x 1.7 cm, compatible with given history of lung cancer. 2. No other acute/significant findings. Electronically Signed   By: Bary Richard M.D.   On: 08/07/2015 13:35   Ct Head W Wo Contrast  07/20/2015  CLINICAL DATA:  Left upper lobe lung nodule.  Cancer staging. EXAM: CT HEAD WITHOUT AND WITH CONTRAST TECHNIQUE: Contiguous axial images were obtained from the base of the skull through the vertex without and with intravenous contrast CONTRAST:  75mL ISOVUE-300 IOPAMIDOL (ISOVUE-300) INJECTION 61% COMPARISON:  None. FINDINGS: There is no evidence of acute cortical infarct, intracranial hemorrhage, mass, midline shift, or extra-axial fluid collection. Ventricles and sulci are normal for age. There is a 6 x 4 mm irregular focus  of enhancement versus artifact involving right frontal cortex on a single image (series 3, image 17). No other enhancing brain lesions are identified. An incidental developmental venous anomaly is noted in the right centrum semiovale. IMPRESSION:  No definite intracranial metastatic disease identified. Questionable 6 x 4 mm enhancing lesion versus artifact in the right frontal cortex, for which a tiny metastasis is not excluded. Attention on follow-up. Electronically Signed   By: Sebastian Ache M.D.   On: 07/20/2015 13:54   Dg Chest Port 1 View  08/14/2015  CLINICAL DATA:  60 year old male status post recent left thoracotomy and left upper lobectomy. EXAM: PORTABLE CHEST 1 VIEW COMPARISON:  Chest radiograph dated 08/13/2015 FINDINGS: There are two left-sided chest tubes in similar position as the prior study. There has been interval improvement of the aeration of the left lung compared to the prior study. An area of hazy density at the left lung base likely represent atelectatic changes. The right lung is clear. There is no pleural effusion. No pneumothorax identified. The cardiac silhouette is within normal limits. No acute osseous pathology. Postsurgical changes of the left lateral thoracic wall. IMPRESSION: Postsurgical changes of the left-sided left upper lobectomy with stable appearance of left-sided chest tubes. Interval improvement of aeration of the left lung. No pneumothorax. Electronically Signed   By: Elgie Collard M.D.   On: 08/14/2015 06:22   Dg Chest Port 1 View  08/13/2015  CLINICAL DATA:  60 year old male with a history of left lung mass. Status post left thoracotomy and left upper lobectomy. EXAM: PORTABLE CHEST 1 VIEW COMPARISON:  08/07/2015 FINDINGS: Interval surgical changes, with placement of 2 large poor left-sided thoracostomy tubes. First terminates at the left apex. The second terminates in the cardiophrenic sulcus. Surgical clips of the soft tissues. No visualized pneumothorax. Right  to left mediastinal shift. Veiled opacity of the retrocardiac region. Right lung remains relatively well aerated. IMPRESSION: Early postoperative changes of left-sided VATS, left upper lobectomy, with 2 thoracostomy tubes and no visualized pneumothorax. Right lung relatively well aerated. Signed, Yvone Neu. Loreta Ave, DO [facility] Electronically Signed   By: Gilmer Mor D.O.   On: 08/13/2015 14:18     CBC  Recent Labs Lab 08/14/15 0420  WBC 11.6*  HGB 13.4  HCT 40.3  PLT 182  MCV 100.6*  MCH 33.5  MCHC 33.3  RDW 14.0    Chemistries   Recent Labs Lab 08/14/15 0420  NA 137  K 4.4  CL 105  CO2 27  GLUCOSE 123*  BUN 14  CREATININE 1.00  CALCIUM 8.0*  AST 31  ALT 13*  ALKPHOS 47  BILITOT 0.7   ------------------------------------------------------------------------------------------------------------------ estimated creatinine clearance is 82.1 mL/min (by C-G formula based on Cr of 1). ------------------------------------------------------------------------------------------------------------------ No results for input(s): HGBA1C in the last 72 hours. ------------------------------------------------------------------------------------------------------------------ No results for input(s): CHOL, HDL, LDLCALC, TRIG, CHOLHDL, LDLDIRECT in the last 72 hours. ------------------------------------------------------------------------------------------------------------------ No results for input(s): TSH, T4TOTAL, T3FREE, THYROIDAB in the last 72 hours.  Invalid input(s): FREET3 ------------------------------------------------------------------------------------------------------------------ No results for input(s): VITAMINB12, FOLATE, FERRITIN, TIBC, IRON, RETICCTPCT in the last 72 hours.  Coagulation profile No results for input(s): INR, PROTIME in the last 168 hours.  No results for input(s): DDIMER in the last 72  hours.  Cardiac Enzymes No results for input(s): CKMB, TROPONINI, MYOGLOBIN in the last 168 hours.  Invalid input(s): CK ------------------------------------------------------------------------------------------------------------------ Invalid input(s): POCBNP    Assessment & Plan   * COPD No evidence of exasperation Continue nebulizers when necessary  * Urinary retention start patient on Flomax Voiding trials  * Left upper lobe Lung mass Status post left upper lobe resection. Oncology follow-up as outpatient.  * Scleroderma Continue  amlodipine    *  anxiety and tinea Xanax  Ambien when necessary to sleep  * DVT prophylaxis SCDs in place      Code Status Orders        Start     Ordered   08/13/15 1529  Full code   Continuous     08/13/15 1528    Code Status History    Date Active Date Inactive Code Status Order ID Comments User Context   This patient has a current code status but no historical code status.    Advance Directive Documentation        Most Recent Value   Type of Advance Directive  Healthcare Power of Attorney, Living will   Pre-existing out of facility DNR order (yellow form or pink MOST form)     "MOST" Form in Place?                DVT Prophylaxis  scd's  Lab Results  Component Value Date   PLT 182 08/14/2015     Time Spent in minutes    Greater than 50% of time spent in care coordination and counseling patient regarding the condition and plan of care.   Auburn Bilberry M.D on 08/15/2015 at 12:16 PM  Between 7am to 6pm - Pager - [phone]  After 6pm go to [email or web address] - password [email or web address] [facility]  [facility]   Office  [phone]

## 2015-08-15 NOTE — Anesthesia Postprocedure Evaluation (Deleted)
Anesthesia Post Note  Patient: Thomas Le  Procedure(s) Performed: * No procedures listed *  Patient location during evaluation: Nursing Unit Anesthesia Type: Epidural Level of consciousness: awake and alert and oriented Pain management: satisfactory to patient Vital Signs Assessment: post-procedure vital signs reviewed and stable Respiratory status: respiratory function stable Cardiovascular status: stable Anesthetic complications: no    Last Vitals:  Filed Vitals:   08/15/15 0512 08/15/15 0525  BP:  104/61  Pulse: 75 69  Temp:  36.7 C  Resp:  20    Last Pain:  Filed Vitals:   08/15/15 0707  PainSc: 3                  Blima Singer

## 2015-08-15 NOTE — Anesthesia Post-op Follow-up Note (Signed)
  Anesthesia Pain Follow-up Note  Patient: WELDEN HAUSMANN  Day #: 2  Date of Follow-up: 08/15/2015 Time: 7:08 AM  Last Vitals:  Filed Vitals:   08/15/15 0512 08/15/15 0525  BP:  104/61  Pulse: 75 69  Temp:  36.7 C  Resp:  20    Level of Consciousness: alert  Pain: mild   Side Effects:None  Catheter Site Exam:clean, dry  Epidural / Intrathecal    Start     Dose/Rate Route Frequency Ordered Stop   08/13/15 1315  fentaNYL 2.5 mcg/ml w/ropivacaine 0.2% (preservative free) in normal saline 100 mL EPIDURAL Infusion in 150 ml Intravia Bag     10 mL/hr 10 mL/hr  Epidural Continuous 08/13/15 1310         Plan: Continue current therapy  Blima Singer

## 2015-08-15 NOTE — Progress Notes (Signed)
PT Cancellation Note  Patient Details Name: Thomas Le MRN: [id number] DOB: 1955/03/18   Cancelled Treatment:    Reason Eval/Treat Not Completed: Pain limiting ability to participate;Medical issues which prohibited therapy. Attempted to work with patient however he is currently refusing. Pt reports that he is in too much pain to ambulate at this time. RN is also redressing chest tube incision due to it being pulled during transport. Will attempt PT on later date/time as pt is appropriate. Pt encouraged to ambulate with nursing staff this PM.  [name] PT, DPT   [name] 08/15/2015, 3:11 PM

## 2015-08-15 NOTE — Progress Notes (Signed)
Could not void last evening.  Bladder scan showed 650 cc and required straight cath.  Ate well.  Walked in halls  VSS, Afeb.  BP about 528 systolic. Wounds redressed.  Cleans and dry without erythema. No air leak with vigorous cough  Will place chest tube to water seal and repeat CXRay this afternoon. Will remove epidural and streamline oral analgesics  Path shows metastatic renal cell carcinoma  Marta Lamas.

## 2015-08-16 MED ORDER — DOCUSATE SODIUM 100 MG PO CAPS
100.0000 mg | ORAL_CAPSULE | Freq: Two times a day (BID) | ORAL | Status: DC
  Administered 2015-08-16 – 2015-08-18 (×5): 100 mg via ORAL
  Filled 2015-08-16 (×5): qty 1

## 2015-08-16 NOTE — Progress Notes (Signed)
[facility] Post-Op [facility]  Patient ID: CARMAN ESSICK, male   DOB: Feb 08, 1956, 60 y.o.   MRN: [id number]  HISTORY: Pain was an issue after epidural removed but today is under much better control.  Able to urinate OK.  Not short of breath.   Filed Vitals:   08/15/15 2133 08/16/15 0600  BP: 149/84 112/54  Pulse: 64 51  Temp: 98 F (36.7 C) 97.5 F (36.4 C)  Resp: 24 19     EXAM: Resp: Lungs are clear bilaterally but diminished on the left.  No respiratory distress, normal effort. Heart:  Regular without murmurs Neurological: Alert and oriented to person, place, and time. Coordination normal.  Skin: Skin is warm and dry. No rash noted. No diaphoretic. No erythema. No pallor.  Psychiatric: Normal mood and affect. Normal behavior. Judgment and thought content normal.   No air leak.  CT drained 350 cc over last 24 hours  ASSESSMENT: I have reviewed the pathology showing metastatic renal cell.  Discussed with Dr. Fabienne Bruns who will review the case.    PLAN:   Continue chest tubes to water seal.  Encourage ambulation.  Work on better pain control.      Nestor Lewandowsky, MD

## 2015-08-16 NOTE — Progress Notes (Signed)
[facility] - [place] at [facility]                                                                                                                                                                                            Patient Demographics   Thomas Le, is a 60 y.o. male, DOB - August 09, 1955, GEX:[id number]  Admit date - 08/13/2015   Admitting Physician Hulda Marin, MD  Outpatient Primary MD for the patient is [name], MD   LOS - 3  Subjective: Patient able to weigh today. States the pain is better controlled.   Review of Systems:   CONSTITUTIONAL: No documented fever. No fatigue, weakness. No weight gain, no weight loss.  EYES: No blurry or double vision.  ENT: No tinnitus. No postnasal drip. No redness of the oropharynx.  RESPIRATORY: No cough, no wheeze, no hemoptysis. No dyspnea.  CARDIOVASCULAR: Positive chest pain. No orthopnea. No palpitations. No syncope.  GASTROINTESTINAL: No nausea, no vomiting or diarrhea. No abdominal pain. No melena or hematochezia.  GENITOURINARY: No dysuria or hematuria.  ENDOCRINE: No polyuria or nocturia. No heat or cold intolerance.  HEMATOLOGY: No anemia. No bruising. No bleeding.  INTEGUMENTARY: No rashes. No lesions.  MUSCULOSKELETAL: No arthritis. No swelling. No gout.  NEUROLOGIC: No numbness, tingling, or ataxia. No seizure-type activity.  PSYCHIATRIC: Positive anxiety. Positive insomnia. No ADD.    Vitals:   Filed Vitals:   08/15/15 1143 08/15/15 2133 08/16/15 0600 08/16/15 0751  BP: 109/55 149/84 112/54   Pulse: 77 64 51   Temp: 98.2 F (36.8 C) 98 F (36.7 C) 97.5 F (36.4 C)   TempSrc:   Oral   Resp: 18 24 19    Height:      Weight:      SpO2: 99% 98% 97% 99%    Wt Readings from Last 3 Encounters:  08/13/15 74.844 kg (165 lb)  08/09/15 74.844 kg (165 lb)  08/07/15 75.206 kg (165 lb 12.8 oz)     Intake/Output Summary (Last 24 hours) at 08/16/15 1234 Last data filed at 08/16/15  0901  Gross per 24 hour  Intake   1205 ml  Output   2090 ml  Net   -885 ml    Physical Exam:   GENERAL: Pleasant-appearing in no apparent distress.  HEAD, EYES, EARS, NOSE AND THROAT: Atraumatic, normocephalic. Extraocular muscles are intact. Pupils equal and reactive to light. Sclerae anicteric. No conjunctival injection. No oro-pharyngeal erythema.  NECK: Supple. There is no jugular venous distention. No bruits, no lymphadenopathy, no thyromegaly.  HEART: Regular rate and rhythm,. No murmurs, no rubs, no clicks.  LUNGS: Clear  to auscultation bilaterally. No rales or rhonchi. No wheezes.  ABDOMEN: Soft, flat, nontender, nondistended. Has good bowel sounds. No hepatosplenomegaly appreciated.  EXTREMITIES: No evidence of any cyanosis, clubbing, or peripheral edema.  +2 pedal and radial pulses bilaterally.  NEUROLOGIC: The patient is alert, awake, and oriented x3 with no focal motor or sensory deficits appreciated bilaterally.  SKIN: Moist and warm with no rashes appreciated.  Psych: Not anxious, depressed LN: No inguinal LN enlargement    Antibiotics   Anti-infectives    Start     Dose/Rate Route Frequency Ordered Stop   08/13/15 1530  vancomycin (VANCOCIN) IVPB 1000 mg/200 mL premix     1,000 mg 200 mL/hr over 60 Minutes Intravenous Every 12 hours 08/13/15 1528 08/13/15 1716   08/13/15 0645  vancomycin (VANCOCIN) IVPB 1000 mg/200 mL premix     1,000 mg 200 mL/hr over 60 Minutes Intravenous  Once 08/13/15 0638 08/13/15 0849      Medications   Scheduled Meds: . albuterol  2.5 mg Nebulization Q4H while awake  . bisacodyl  10 mg Oral Daily  . cyclobenzaprine  10 mg Oral TID  . ketorolac  15 mg Intravenous Q6H  . mupirocin ointment  1 application Topical BID  . nicotine  14 mg Transdermal Daily  . tamsulosin  0.4 mg Oral Daily   Continuous Infusions:   PRN Meds:.ALPRAZolam, morphine injection, ondansetron (ZOFRAN) IV, oxyCODONE-acetaminophen, zolpidem   Data Review:    Micro Results Recent Results (from the past 240 hour(s))  Surgical pcr screen     Status: Abnormal   Collection Time: 08/09/15  3:18 PM  Result Value Ref Range Status   MRSA, PCR POSITIVE (A) NEGATIVE Final    Comment: CRITICAL RESULT CALLED TO, READ BACK BY AND VERIFIED WITH: MINDY HUFFINES 08/09/15 1740 VKB    Staphylococcus aureus POSITIVE (A) NEGATIVE Final    Radiology Reports Dg Chest 2 View  08/15/2015  CLINICAL DATA:  Follow-up left upper lobectomy. Left thoracotomy performed on 08/13/2015. Left chest tube. EXAM: CHEST  2 VIEW COMPARISON:  08/14/2015 FINDINGS: 2 left chest tubes remain in place, unchanged. No pneumothorax. Postoperative changes on the left. No confluent airspace opacities in the lungs. No effusions. Minimal left chest wall subcutaneous emphysema. IMPRESSION: Stable position of the left chest tubes.  No pneumothorax. Electronically Signed   By: Charlett Nose M.D.   On: 08/15/2015 14:27   Dg Chest 2 View  08/07/2015  CLINICAL DATA:  Lung cancer, smoker.  Preoperative for surgery. EXAM: CHEST  2 VIEW COMPARISON:  PET-CT dated 07/12/2015. FINDINGS: Heart size is normal. Left upper lobe nodule measures approximately 2 cm greatest dimension, more definitively measured on recent PET-CT in 2.5 x 1.7 cm, compatible with given history of lung cancer. Lungs otherwise clear. No pleural effusion seen. Osseous structures about the chest are unremarkable. IMPRESSION: 1. Left upper lobe pulmonary nodule, measured more definitively on recent PET-CT at 2.5 x 1.7 cm, compatible with given history of lung cancer. 2. No other acute/significant findings. Electronically Signed   By: Bary Richard M.D.   On: 08/07/2015 13:35   Ct Head W Wo Contrast  07/20/2015  CLINICAL DATA:  Left upper lobe lung nodule.  Cancer staging. EXAM: CT HEAD WITHOUT AND WITH CONTRAST TECHNIQUE: Contiguous axial images were obtained from the base of the skull through the vertex without and with intravenous contrast  CONTRAST:  75mL ISOVUE-300 IOPAMIDOL (ISOVUE-300) INJECTION 61% COMPARISON:  None. FINDINGS: There is no evidence of acute cortical infarct, intracranial hemorrhage,  mass, midline shift, or extra-axial fluid collection. Ventricles and sulci are normal for age. There is a 6 x 4 mm irregular focus of enhancement versus artifact involving right frontal cortex on a single image (series 3, image 17). No other enhancing brain lesions are identified. An incidental developmental venous anomaly is noted in the right centrum semiovale. IMPRESSION: No definite intracranial metastatic disease identified. Questionable 6 x 4 mm enhancing lesion versus artifact in the right frontal cortex, for which a tiny metastasis is not excluded. Attention on follow-up. Electronically Signed   By: Sebastian Ache M.D.   On: 07/20/2015 13:54   Dg Chest Port 1 View  08/14/2015  CLINICAL DATA:  60 year old male status post recent left thoracotomy and left upper lobectomy. EXAM: PORTABLE CHEST 1 VIEW COMPARISON:  Chest radiograph dated 08/13/2015 FINDINGS: There are two left-sided chest tubes in similar position as the prior study. There has been interval improvement of the aeration of the left lung compared to the prior study. An area of hazy density at the left lung base likely represent atelectatic changes. The right lung is clear. There is no pleural effusion. No pneumothorax identified. The cardiac silhouette is within normal limits. No acute osseous pathology. Postsurgical changes of the left lateral thoracic wall. IMPRESSION: Postsurgical changes of the left-sided left upper lobectomy with stable appearance of left-sided chest tubes. Interval improvement of aeration of the left lung. No pneumothorax. Electronically Signed   By: Elgie Collard M.D.   On: 08/14/2015 06:22   Dg Chest Port 1 View  08/13/2015  CLINICAL DATA:  60 year old male with a history of left lung mass. Status post left thoracotomy and left upper lobectomy. EXAM:  PORTABLE CHEST 1 VIEW COMPARISON:  08/07/2015 FINDINGS: Interval surgical changes, with placement of 2 large poor left-sided thoracostomy tubes. First terminates at the left apex. The second terminates in the cardiophrenic sulcus. Surgical clips of the soft tissues. No visualized pneumothorax. Right to left mediastinal shift. Veiled opacity of the retrocardiac region. Right lung remains relatively well aerated. IMPRESSION: Early postoperative changes of left-sided VATS, left upper lobectomy, with 2 thoracostomy tubes and no visualized pneumothorax. Right lung relatively well aerated. Signed, Yvone Neu. Loreta Ave, DO [facility] Electronically Signed   By: Gilmer Mor D.O.   On: 08/13/2015 14:18     CBC  Recent Labs Lab 08/14/15 0420  WBC 11.6*  HGB 13.4  HCT 40.3  PLT 182  MCV 100.6*  MCH 33.5  MCHC 33.3  RDW 14.0    Chemistries   Recent Labs Lab 08/14/15 0420  NA 137  K 4.4  CL 105  CO2 27  GLUCOSE 123*  BUN 14  CREATININE 1.00  CALCIUM 8.0*  AST 31  ALT 13*  ALKPHOS 47  BILITOT 0.7   ------------------------------------------------------------------------------------------------------------------ estimated creatinine clearance is 82.1 mL/min (by C-G formula based on Cr of 1). ------------------------------------------------------------------------------------------------------------------ No results for input(s): HGBA1C in the last 72 hours. ------------------------------------------------------------------------------------------------------------------ No results for input(s): CHOL, HDL, LDLCALC, TRIG, CHOLHDL, LDLDIRECT in the last 72 hours. ------------------------------------------------------------------------------------------------------------------ No results for input(s): TSH, T4TOTAL, T3FREE, THYROIDAB in the last 72 hours.  Invalid input(s):  FREET3 ------------------------------------------------------------------------------------------------------------------ No results for input(s): VITAMINB12, FOLATE, FERRITIN, TIBC, IRON, RETICCTPCT in the last 72 hours.  Coagulation profile No results for input(s): INR, PROTIME in the last 168 hours.  No results for input(s): DDIMER in the last 72 hours.  Cardiac Enzymes No results for input(s): CKMB, TROPONINI, MYOGLOBIN in the last 168 hours.  Invalid input(s):  CK ------------------------------------------------------------------------------------------------------------------ Invalid input(s): POCBNP    Assessment & Plan   * COPD No evidence of exasperation Continue nebulizers when necessary  * Urinary retentionContinue Flomax Improved  * Left upper lobe Lung mass Status post left upper lobe resection. Oncology follow-up as outpatient.  * Scleroderma Continue  amlodipine    * anxiety continue Xanax  Ambien when necessary to sleep  * DVT prophylaxis SCDs in place      Code Status Orders        Start     Ordered   08/13/15 1529  Full code   Continuous     08/13/15 1528    Code Status History    Date Active Date Inactive Code Status Order ID Comments User Context   This patient has a current code status but no historical code status.    Advance Directive Documentation        Most Recent Value   Type of Advance Directive  Healthcare Power of Attorney, Living will   Pre-existing out of facility DNR order (yellow form or pink MOST form)     "MOST" Form in Place?                DVT Prophylaxis  scd's  Lab Results  Component Value Date   PLT 182 08/14/2015     Time Spent in minutes    Greater than 50% of time spent in care coordination and counseling patient regarding the condition and plan of care.   Auburn Bilberry M.D on 08/16/2015 at 12:34 PM  Between 7am to 6pm - Pager - [phone]  After 6pm go to [email or web address] - password  [facility]  [facility]   Office  [phone]

## 2015-08-17 ENCOUNTER — Inpatient Hospital Stay: Payer: BLUE CROSS/BLUE SHIELD

## 2015-08-17 LAB — SURGICAL PATHOLOGY

## 2015-08-17 MED ORDER — TRAMADOL HCL 50 MG PO TABS
50.0000 mg | ORAL_TABLET | Freq: Four times a day (QID) | ORAL | Status: DC
  Administered 2015-08-17 – 2015-08-18 (×5): 50 mg via ORAL
  Filled 2015-08-17 (×5): qty 1

## 2015-08-17 MED ORDER — ALBUTEROL SULFATE (2.5 MG/3ML) 0.083% IN NEBU: 2.5000 mg | INHALATION_SOLUTION | RESPIRATORY_TRACT | Status: DC | PRN

## 2015-08-17 MED ORDER — POLYETHYLENE GLYCOL 3350 17 G PO PACK
17.0000 g | PACK | Freq: Every day | ORAL | Status: DC
  Filled 2015-08-17: qty 1

## 2015-08-17 MED ORDER — AMLODIPINE BESYLATE 5 MG PO TABS
2.5000 mg | ORAL_TABLET | Freq: Every day | ORAL | Status: DC
  Administered 2015-08-17 – 2015-08-18 (×2): 2.5 mg via ORAL
  Filled 2015-08-17 (×2): qty 1

## 2015-08-17 MED ORDER — POLYETHYLENE GLYCOL 3350 17 G PO PACK
17.0000 g | PACK | Freq: Every day | ORAL | Status: DC
  Administered 2015-08-17 – 2015-08-18 (×2): 17 g via ORAL
  Filled 2015-08-17: qty 1

## 2015-08-17 NOTE — Progress Notes (Signed)
Thomas Le Inpatient Post-Op Note  Patient ID: Thomas Le, male   DOB: 01-01-1956, 60 y.o.   MRN: [id number]  HISTORY: He continues to have complaints of postoperative discomfort with movement. He's not had a bowel movement would like something for that as well. His breathing is fine without any labor. There is no air leak from the chest tubes.   Filed Vitals:   08/16/15 2012 08/17/15 0556  BP: 132/64 147/87  Pulse: 79 71  Temp: 97.3 F (36.3 C) 98.2 F (36.8 C)  Resp: 20 20     EXAM: Resp: Lungs are clear bilaterally.  No respiratory distress, normal effort. Heart:  Regular without murmurs Abd:  Abdomen is soft, non distended and non tender. No masses are palpable.  There is no rebound and no guarding.  Neurological: Alert and oriented to person, place, and time. Coordination normal.  Skin: Skin is warm and dry. No rash noted. No diaphoretic. No erythema. No pallor.  Psychiatric: Normal mood and affect. Normal behavior. Judgment and thought content normal.   No air leak visible. Chest tube drained 360 cc yesterday.  ASSESSMENT: Postoperative day #4 following left upper lobectomy for metastatic renal cell carcinoma. We will try to consolidate his pain management into something that is more conducive for discharge. We will repeat a chest x-ray today and perhaps remove one of the chest tubes. We will encourage him to be as independent as possible.   PLAN:   Chest x-ray today with possible chest tube removal.    Nestor Lewandowsky, MD

## 2015-08-17 NOTE — Progress Notes (Signed)
Physical Therapy Treatment Patient Details Name: Thomas Le MRN: [id number] DOB: 1955/08/31 Today's Date: 08/17/2015    History of Present Illness Pt underwent left thoracotomy with left upper lobectomy and is POD#1 at time of PT evaluation. Pt denies falls in the last 12 months. Reports fully independent function prior to surgery.    PT Comments    Pt demonstrates significant improvement in mobility since initial PT evaluation. He is independent with bed mobility and transfers without an assistive device. He is able to ambulate 3 laps around RN station without assistive device at supervision/independent level. No evidence for instability during ambulation and no DOE. Pt does have some moisture noted on L side of gown and boxers near chest tube. RN notified who agrees to assess. At this time pt has no further needs for PT services. Encouraged pt to continue ambulation with nursing staff during admission. Will complete order. Please enter new order if status or needs change.    Follow Up Recommendations  No PT follow up     Equipment Recommendations  None recommended by PT    Recommendations for Other Services       Precautions / Restrictions Precautions Precautions: None Restrictions Weight Bearing Restrictions: No    Mobility  Bed Mobility Overal bed mobility: Independent             General bed mobility comments: Good speed and sequencing noted today with minimal increase in pain. No assist required  Transfers Overall transfer level: Independent Equipment used: None   Sit to Stand: Independent         General transfer comment: Pt demonstrates excellent speed, sequencing, and stability. No dizziness or instability noted in standing. Pt able to perform turns without issue in standing  Ambulation/Gait Ambulation/Gait assistance: Supervision Ambulation Distance (Feet): 550 Feet Assistive device: None Gait Pattern/deviations: WFL(Within Functional  Limits) Gait velocity: Decreased but functional for limited community mobility   General Gait Details: Pt ambulates 3 laps around RN station and back to room. Pt able to perform gait speed changes and head turns without notable lateral gait deviation. Denies DOE. Upon return to room vitals obtained and HR/BP WNL. Unable to get pulse oximeter to read SaO2. Pt requires no assist and demonstrates no instability. Gait speed is slightly decreased due to guarding secondary to pain with decreased L arm swing but fully functional for limited community ambulation. At end of session pt reports that his boxers and gown are slightly wet on the L lateral side near the chest tube. Examined and notified RN to assess.   Stairs            Wheelchair Mobility    Modified Rankin (Stroke Patients Only)       Balance Overall balance assessment: No apparent balance deficits (not formally assessed)                                  Cognition Arousal/Alertness: Awake/alert Behavior During Therapy: WFL for tasks assessed/performed Overall Cognitive Status: Within Functional Limits for tasks assessed                      Exercises      General Comments        Pertinent Vitals/Pain Pain Assessment: 0-10 Pain Score: 5  Pain Location: L flank at site of chest tube and thoracotomy incision Pain Intervention(s): Premedicated before session;Monitored during session    Home Living  Prior Function            PT Goals (current goals can now be found in the care plan section) Acute Rehab PT Goals Patient Stated Goal: Return to prior level of function at home PT Goal Formulation: All assessment and education complete, DC therapy Progress towards PT goals: Goals met and updated - see care plan    Frequency       PT Plan Frequency needs to be updated;Equipment recommendations need to be updated    Co-evaluation             End of Session  Equipment Utilized During Treatment: Other (comment) (No gait belt due to surgical sites) Activity Tolerance: Patient tolerated treatment well Patient left: with call bell/phone within reach;in bed     Time: 1000-1015 PT Time Calculation (min) (ACUTE ONLY): 15 min  Charges:  $Gait Training: 8-22 mins                    G Codes:      Sharalyn Ink Huprich PT, DPT   Huprich,Jason 08/17/2015, 11:43 AM

## 2015-08-17 NOTE — Plan of Care (Signed)
Problem: Pain Management: Goal: Pain level will decrease Outcome: Progressing Attempting to maintain pain control on po medication

## 2015-08-17 NOTE — Progress Notes (Signed)
[facility] [place] at [facility]                                                                                                                                                                                            Patient Demographics   Thomas Le, is a 60 y.o. male, DOB - [birth year], ZOX:[id number]  Admit date - 08/13/2015   Admitting Physician Hulda Marin, MD  Outpatient Primary MD for the patient is [name], MD   LOS - 4  Subjective: Feeling better chest pain intermittently but overall better   Review of Systems:   CONSTITUTIONAL: No documented fever. No fatigue, weakness. No weight gain, no weight loss.  EYES: No blurry or double vision.  ENT: No tinnitus. No postnasal drip. No redness of the oropharynx.  RESPIRATORY: No cough, no wheeze, no hemoptysis. No dyspnea.  CARDIOVASCULAR: Positive chest pain. No orthopnea. No palpitations. No syncope.  GASTROINTESTINAL: No nausea, no vomiting or diarrhea. No abdominal pain. No melena or hematochezia.  GENITOURINARY: No dysuria or hematuria.  ENDOCRINE: No polyuria or nocturia. No heat or cold intolerance.  HEMATOLOGY: No anemia. No bruising. No bleeding.  INTEGUMENTARY: No rashes. No lesions.  MUSCULOSKELETAL: No arthritis. No swelling. No gout.  NEUROLOGIC: No numbness, tingling, or ataxia. No seizure-type activity.  PSYCHIATRIC: Positive anxiety. Positive insomnia. No ADD.    Vitals:   Filed Vitals:   08/17/15 0556 08/17/15 0741 08/17/15 1009 08/17/15 1240  BP: 147/87  144/81 141/76  Pulse: 71  75 81  Temp: 98.2 F (36.8 C)   98.3 F (36.8 C)  TempSrc: Oral   Oral  Resp: 20   20  Height:      Weight:      SpO2: 99% 97%  98%    Wt Readings from Last 3 Encounters:  08/13/15 74.844 kg (165 lb)  08/09/15 74.844 kg (165 lb)  08/07/15 75.206 kg (165 lb 12.8 oz)     Intake/Output Summary (Last 24 hours) at 08/17/15 1436 Last data filed at 08/17/15 1000  Gross per 24 hour   Intake    240 ml  Output    565 ml  Net   -325 ml    Physical Exam:   GENERAL: Pleasant-appearing in no apparent distress.  HEAD, EYES, EARS, NOSE AND THROAT: Atraumatic, normocephalic. Extraocular muscles are intact. Pupils equal and reactive to light. Sclerae anicteric. No conjunctival injection. No oro-pharyngeal erythema.  NECK: Supple. There is no jugular venous distention. No bruits, no lymphadenopathy, no thyromegaly.  HEART: Regular rate and rhythm,. No murmurs, no rubs, no clicks.  LUNGS: Clear to auscultation bilaterally. No  rales or rhonchi. No wheezes.  ABDOMEN: Soft, flat, nontender, nondistended. Has good bowel sounds. No hepatosplenomegaly appreciated.  EXTREMITIES: No evidence of any cyanosis, clubbing, or peripheral edema.  +2 pedal and radial pulses bilaterally.  NEUROLOGIC: The patient is alert, awake, and oriented x3 with no focal motor or sensory deficits appreciated bilaterally.  SKIN: Moist and warm with no rashes appreciated.  Psych: Not anxious, depressed LN: No inguinal LN enlargement    Antibiotics   Anti-infectives    Start     Dose/Rate Route Frequency Ordered Stop   08/13/15 1530  vancomycin (VANCOCIN) IVPB 1000 mg/200 mL premix     1,000 mg 200 mL/hr over 60 Minutes Intravenous Every 12 hours 08/13/15 1528 08/13/15 1716   08/13/15 0645  vancomycin (VANCOCIN) IVPB 1000 mg/200 mL premix     1,000 mg 200 mL/hr over 60 Minutes Intravenous  Once 08/13/15 0638 08/13/15 0849      Medications   Scheduled Meds: . albuterol  2.5 mg Nebulization Q4H while awake  . bisacodyl  10 mg Oral Daily  . cyclobenzaprine  10 mg Oral TID  . docusate sodium  100 mg Oral BID  . mupirocin ointment  1 application Topical BID  . nicotine  14 mg Transdermal Daily  . tamsulosin  0.4 mg Oral Daily  . traMADol  50 mg Oral Q6H   Continuous Infusions:   PRN Meds:.ALPRAZolam, ondansetron (ZOFRAN) IV, oxyCODONE-acetaminophen, zolpidem   Data Review:   Micro  Results Recent Results (from the past 240 hour(s))  Surgical pcr screen     Status: Abnormal   Collection Time: 08/09/15  3:18 PM  Result Value Ref Range Status   MRSA, PCR POSITIVE (A) NEGATIVE Final    Comment: CRITICAL RESULT CALLED TO, READ BACK BY AND VERIFIED WITH: MINDY HUFFINES 08/09/15 1740 VKB    Staphylococcus aureus POSITIVE (A) NEGATIVE Final    Radiology Reports Dg Chest 2 View  08/17/2015  CLINICAL DATA:  Postop left thoracotomy, left upper lobectomy EXAM: CHEST  2 VIEW COMPARISON:  08/14/2005 FINDINGS: Left chest tubes remain in place. No visible pneumothorax. Postoperative changes on the left. Right lung is clear. Heart is normal size. IMPRESSION: Left chest tubes remain in place with postoperative changes on the left. No visible pneumothorax. Electronically Signed   By: Charlett Nose M.D.   On: 08/17/2015 11:54   Dg Chest 2 View  08/15/2015  CLINICAL DATA:  Follow-up left upper lobectomy. Left thoracotomy performed on 08/13/2015. Left chest tube. EXAM: CHEST  2 VIEW COMPARISON:  08/14/2015 FINDINGS: 2 left chest tubes remain in place, unchanged. No pneumothorax. Postoperative changes on the left. No confluent airspace opacities in the lungs. No effusions. Minimal left chest wall subcutaneous emphysema. IMPRESSION: Stable position of the left chest tubes.  No pneumothorax. Electronically Signed   By: Charlett Nose M.D.   On: 08/15/2015 14:27   Dg Chest 2 View  08/07/2015  CLINICAL DATA:  Lung cancer, smoker.  Preoperative for surgery. EXAM: CHEST  2 VIEW COMPARISON:  PET-CT dated 07/12/2015. FINDINGS: Heart size is normal. Left upper lobe nodule measures approximately 2 cm greatest dimension, more definitively measured on recent PET-CT in 2.5 x 1.7 cm, compatible with given history of lung cancer. Lungs otherwise clear. No pleural effusion seen. Osseous structures about the chest are unremarkable. IMPRESSION: 1. Left upper lobe pulmonary nodule, measured more definitively on recent  PET-CT at 2.5 x 1.7 cm, compatible with given history of lung cancer. 2. No other acute/significant findings. Electronically Signed  By: Bary Richard M.D.   On: 08/07/2015 13:35   Ct Head W Wo Contrast  07/20/2015  CLINICAL DATA:  Left upper lobe lung nodule.  Cancer staging. EXAM: CT HEAD WITHOUT AND WITH CONTRAST TECHNIQUE: Contiguous axial images were obtained from the base of the skull through the vertex without and with intravenous contrast CONTRAST:  75mL ISOVUE-300 IOPAMIDOL (ISOVUE-300) INJECTION 61% COMPARISON:  None. FINDINGS: There is no evidence of acute cortical infarct, intracranial hemorrhage, mass, midline shift, or extra-axial fluid collection. Ventricles and sulci are normal for age. There is a 6 x 4 mm irregular focus of enhancement versus artifact involving right frontal cortex on a single image (series 3, image 17). No other enhancing brain lesions are identified. An incidental developmental venous anomaly is noted in the right centrum semiovale. IMPRESSION: No definite intracranial metastatic disease identified. Questionable 6 x 4 mm enhancing lesion versus artifact in the right frontal cortex, for which a tiny metastasis is not excluded. Attention on follow-up. Electronically Signed   By: Sebastian Ache M.D.   On: 07/20/2015 13:54   Dg Chest Port 1 View  08/14/2015  CLINICAL DATA:  60 year old male status post recent left thoracotomy and left upper lobectomy. EXAM: PORTABLE CHEST 1 VIEW COMPARISON:  Chest radiograph dated 08/13/2015 FINDINGS: There are two left-sided chest tubes in similar position as the prior study. There has been interval improvement of the aeration of the left lung compared to the prior study. An area of hazy density at the left lung base likely represent atelectatic changes. The right lung is clear. There is no pleural effusion. No pneumothorax identified. The cardiac silhouette is within normal limits. No acute osseous pathology. Postsurgical changes of the left  lateral thoracic wall. IMPRESSION: Postsurgical changes of the left-sided left upper lobectomy with stable appearance of left-sided chest tubes. Interval improvement of aeration of the left lung. No pneumothorax. Electronically Signed   By: Elgie Collard M.D.   On: 08/14/2015 06:22   Dg Chest Port 1 View  08/13/2015  CLINICAL DATA:  60 year old male with a history of left lung mass. Status post left thoracotomy and left upper lobectomy. EXAM: PORTABLE CHEST 1 VIEW COMPARISON:  08/07/2015 FINDINGS: Interval surgical changes, with placement of 2 large poor left-sided thoracostomy tubes. First terminates at the left apex. The second terminates in the cardiophrenic sulcus. Surgical clips of the soft tissues. No visualized pneumothorax. Right to left mediastinal shift. Veiled opacity of the retrocardiac region. Right lung remains relatively well aerated. IMPRESSION: Early postoperative changes of left-sided VATS, left upper lobectomy, with 2 thoracostomy tubes and no visualized pneumothorax. Right lung relatively well aerated. Signed, Yvone Neu. Loreta Ave, DO [facility] Electronically Signed   By: Gilmer Mor D.O.   On: 08/13/2015 14:18     CBC  Recent Labs Lab 08/14/15 0420  WBC 11.6*  HGB 13.4  HCT 40.3  PLT 182  MCV 100.6*  MCH 33.5  MCHC 33.3  RDW 14.0    Chemistries   Recent Labs Lab 08/14/15 0420  NA 137  K 4.4  CL 105  CO2 27  GLUCOSE 123*  BUN 14  CREATININE 1.00  CALCIUM 8.0*  AST 31  ALT 13*  ALKPHOS 47  BILITOT 0.7   ------------------------------------------------------------------------------------------------------------------ estimated creatinine clearance is 82.1 mL/min (by C-G formula based on Cr of 1). ------------------------------------------------------------------------------------------------------------------ No results for input(s): HGBA1C in the last 72  hours. ------------------------------------------------------------------------------------------------------------------ No results for input(s): CHOL, HDL, LDLCALC, TRIG, CHOLHDL, LDLDIRECT in the  last 72 hours. ------------------------------------------------------------------------------------------------------------------ No results for input(s): TSH, T4TOTAL, T3FREE, THYROIDAB in the last 72 hours.  Invalid input(s): FREET3 ------------------------------------------------------------------------------------------------------------------ No results for input(s): VITAMINB12, FOLATE, FERRITIN, TIBC, IRON, RETICCTPCT in the last 72 hours.  Coagulation profile No results for input(s): INR, PROTIME in the last 168 hours.  No results for input(s): DDIMER in the last 72 hours.  Cardiac Enzymes No results for input(s): CKMB, TROPONINI, MYOGLOBIN in the last 168 hours.  Invalid input(s): CK ------------------------------------------------------------------------------------------------------------------ Invalid input(s): POCBNP    Assessment & Plan   * COPD No evidence of exasperation Continue nebulizers when necessary  * Urinary retentionContinue Flomax ImprovedContinue Flomax  * Left upper lobe Lung mass Status post left upper lobe resection. Therapy per Dr. Inez Catalina Oncology follow-up as outpatient.  * Scleroderma Continue  amlodipine    * anxiety continue Xanax  Ambien when necessary to sleep  * DVT prophylaxis SCDs in place      Code Status Orders        Start     Ordered   08/13/15 1529  Full code   Continuous     08/13/15 1528    Code Status History    Date Active Date Inactive Code Status Order ID Comments User Context   This patient has a current code status but no historical code status.    Advance Directive Documentation        Most Recent Value   Type of Advance Directive  Healthcare Power of Attorney, Living will   Pre-existing out of facility DNR  order (yellow form or pink MOST form)     "MOST" Form in Place?                DVT Prophylaxis  scd's  Lab Results  Component Value Date   PLT 182 08/14/2015     Time Spent in minutes    Greater than 50% of time spent in care coordination and counseling patient regarding the condition and plan of care.   Auburn Bilberry M.D on 08/17/2015 at 2:36 PM  Between 7am to 6pm - Pager - [phone]  After 6pm go to [email or web address] - password [email or web address] [facility]  [facility]   Office  [phone]

## 2015-08-18 ENCOUNTER — Inpatient Hospital Stay: Payer: BLUE CROSS/BLUE SHIELD

## 2015-08-18 MED ORDER — CYCLOBENZAPRINE HCL 10 MG PO TABS: 10.0000 mg | ORAL_TABLET | Freq: Three times a day (TID) | ORAL | Status: DC | PRN

## 2015-08-18 MED ORDER — OXYCODONE-ACETAMINOPHEN 7.5-325 MG PO TABS: 1.0000 | ORAL_TABLET | ORAL | Status: DC | PRN

## 2015-08-18 MED ORDER — TAMSULOSIN HCL 0.4 MG PO CAPS: 0.4000 mg | ORAL_CAPSULE | Freq: Every day | ORAL | Status: DC

## 2015-08-18 NOTE — Progress Notes (Signed)
[facility] - [place] at [facility]                                                                                                                                                                                            Patient Demographics   Thomas Le, is a 60 y.o. male, DOB - 04/20/1955, ZOX:[id number]  Admit date - 08/13/2015   Admitting Physician Hulda Marin, MD  Outpatient Primary MD for the patient is [name], MD   LOS - 5  Subjective: Feels better chest pain under control   Review of Systems:   CONSTITUTIONAL: No documented fever. No fatigue, weakness. No weight gain, no weight loss.  EYES: No blurry or double vision.  ENT: No tinnitus. No postnasal drip. No redness of the oropharynx.  RESPIRATORY: No cough, no wheeze, no hemoptysis. No dyspnea.  CARDIOVASCULAR: Positive chest pain. No orthopnea. No palpitations. No syncope.  GASTROINTESTINAL: No nausea, no vomiting or diarrhea. No abdominal pain. No melena or hematochezia.  GENITOURINARY: No dysuria or hematuria.  ENDOCRINE: No polyuria or nocturia. No heat or cold intolerance.  HEMATOLOGY: No anemia. No bruising. No bleeding.  INTEGUMENTARY: No rashes. No lesions.  MUSCULOSKELETAL: No arthritis. No swelling. No gout.  NEUROLOGIC: No numbness, tingling, or ataxia. No seizure-type activity.  PSYCHIATRIC: Positive anxiety. Positive insomnia. No ADD.    Vitals:   Filed Vitals:   08/17/15 1009 08/17/15 1240 08/17/15 2008 08/18/15 0509  BP: 144/81 141/76 150/77 142/66  Pulse: 75 81 69 69  Temp:  98.3 F (36.8 C) 98.4 F (36.9 C) 97.7 F (36.5 C)  TempSrc:  Oral Oral Oral  Resp:  20 18 18   Height:      Weight:      SpO2:  98% 98% 96%    Wt Readings from Last 3 Encounters:  08/13/15 74.844 kg (165 lb)  08/09/15 74.844 kg (165 lb)  08/07/15 75.206 kg (165 lb 12.8 oz)     Intake/Output Summary (Last 24 hours) at 08/18/15 1202 Last data filed at 08/18/15 0830  Gross per  24 hour  Intake      0 ml  Output    598 ml  Net   -598 ml    Physical Exam:   GENERAL: Pleasant-appearing in no apparent distress.  HEAD, EYES, EARS, NOSE AND THROAT: Atraumatic, normocephalic. Extraocular muscles are intact. Pupils equal and reactive to light. Sclerae anicteric. No conjunctival injection. No oro-pharyngeal erythema.  NECK: Supple. There is no jugular venous distention. No bruits, no lymphadenopathy, no thyromegaly.  HEART: Regular rate and rhythm,. No murmurs, no rubs, no clicks.  LUNGS: Clear to  auscultation bilaterally. No rales or rhonchi. No wheezes.  ABDOMEN: Soft, flat, nontender, nondistended. Has good bowel sounds. No hepatosplenomegaly appreciated.  EXTREMITIES: No evidence of any cyanosis, clubbing, or peripheral edema.  +2 pedal and radial pulses bilaterally.  NEUROLOGIC: The patient is alert, awake, and oriented x3 with no focal motor or sensory deficits appreciated bilaterally.  SKIN: Moist and warm with no rashes appreciated.  Psych: Not anxious, depressed LN: No inguinal LN enlargement    Antibiotics   Anti-infectives    Start     Dose/Rate Route Frequency Ordered Stop   08/13/15 1530  vancomycin (VANCOCIN) IVPB 1000 mg/200 mL premix     1,000 mg 200 mL/hr over 60 Minutes Intravenous Every 12 hours 08/13/15 1528 08/13/15 1716   08/13/15 0645  vancomycin (VANCOCIN) IVPB 1000 mg/200 mL premix     1,000 mg 200 mL/hr over 60 Minutes Intravenous  Once 08/13/15 0638 08/13/15 0849      Medications   Scheduled Meds: . amLODipine  2.5 mg Oral Daily  . bisacodyl  10 mg Oral Daily  . cyclobenzaprine  10 mg Oral TID  . docusate sodium  100 mg Oral BID  . mupirocin ointment  1 application Topical BID  . nicotine  14 mg Transdermal Daily  . polyethylene glycol  17 g Oral Daily  . polyethylene glycol  17 g Oral Daily  . tamsulosin  0.4 mg Oral Daily  . traMADol  50 mg Oral Q6H   Continuous Infusions:   PRN Meds:.albuterol, ALPRAZolam, ondansetron  (ZOFRAN) IV, oxyCODONE-acetaminophen, zolpidem   Data Review:   Micro Results Recent Results (from the past 240 hour(s))  Surgical pcr screen     Status: Abnormal   Collection Time: 08/09/15  3:18 PM  Result Value Ref Range Status   MRSA, PCR POSITIVE (A) NEGATIVE Final    Comment: CRITICAL RESULT CALLED TO, READ BACK BY AND VERIFIED WITH: MINDY HUFFINES 08/09/15 1740 VKB    Staphylococcus aureus POSITIVE (A) NEGATIVE Final    Radiology Reports Dg Chest 2 View  08/18/2015  CLINICAL DATA:  Post left thoracotomy EXAM: CHEST  2 VIEW COMPARISON:  08/16/2012 FINDINGS: Cardiomediastinal silhouette is stable. Again noted status post left upper lobectomy. Left chest tube is unchanged in position. Tiny left apical pneumothorax. Stable left basilar atelectasis. Skin staples in left lower /axillary chest wall again noted. Right lung is clear. Bony thorax is unremarkable. No pulmonary edema. IMPRESSION: Stable postsurgical changes post left upper lobectomy. Stable left chest tube position. Tiny left apical pneumothorax. Again noted mild left basilar atelectasis. Right lung is clear. Electronically Signed   By: Natasha Mead M.D.   On: 08/18/2015 11:49   Dg Chest 2 View  08/17/2015  CLINICAL DATA:  Postop left thoracotomy, left upper lobectomy EXAM: CHEST  2 VIEW COMPARISON:  08/14/2005 FINDINGS: Left chest tubes remain in place. No visible pneumothorax. Postoperative changes on the left. Right lung is clear. Heart is normal size. IMPRESSION: Left chest tubes remain in place with postoperative changes on the left. No visible pneumothorax. Electronically Signed   By: Charlett Nose M.D.   On: 08/17/2015 11:54   Dg Chest 2 View  08/15/2015  CLINICAL DATA:  Follow-up left upper lobectomy. Left thoracotomy performed on 08/13/2015. Left chest tube. EXAM: CHEST  2 VIEW COMPARISON:  08/14/2015 FINDINGS: 2 left chest tubes remain in place, unchanged. No pneumothorax. Postoperative changes on the left. No confluent  airspace opacities in the lungs. No effusions. Minimal left chest wall subcutaneous emphysema. IMPRESSION:  Stable position of the left chest tubes.  No pneumothorax. Electronically Signed   By: Charlett Nose M.D.   On: 08/15/2015 14:27   Dg Chest 2 View  08/07/2015  CLINICAL DATA:  Lung cancer, smoker.  Preoperative for surgery. EXAM: CHEST  2 VIEW COMPARISON:  PET-CT dated 07/12/2015. FINDINGS: Heart size is normal. Left upper lobe nodule measures approximately 2 cm greatest dimension, more definitively measured on recent PET-CT in 2.5 x 1.7 cm, compatible with given history of lung cancer. Lungs otherwise clear. No pleural effusion seen. Osseous structures about the chest are unremarkable. IMPRESSION: 1. Left upper lobe pulmonary nodule, measured more definitively on recent PET-CT at 2.5 x 1.7 cm, compatible with given history of lung cancer. 2. No other acute/significant findings. Electronically Signed   By: Bary Richard M.D.   On: 08/07/2015 13:35   Ct Head W Wo Contrast  07/20/2015  CLINICAL DATA:  Left upper lobe lung nodule.  Cancer staging. EXAM: CT HEAD WITHOUT AND WITH CONTRAST TECHNIQUE: Contiguous axial images were obtained from the base of the skull through the vertex without and with intravenous contrast CONTRAST:  75mL ISOVUE-300 IOPAMIDOL (ISOVUE-300) INJECTION 61% COMPARISON:  None. FINDINGS: There is no evidence of acute cortical infarct, intracranial hemorrhage, mass, midline shift, or extra-axial fluid collection. Ventricles and sulci are normal for age. There is a 6 x 4 mm irregular focus of enhancement versus artifact involving right frontal cortex on a single image (series 3, image 17). No other enhancing brain lesions are identified. An incidental developmental venous anomaly is noted in the right centrum semiovale. IMPRESSION: No definite intracranial metastatic disease identified. Questionable 6 x 4 mm enhancing lesion versus artifact in the right frontal cortex, for which a tiny  metastasis is not excluded. Attention on follow-up. Electronically Signed   By: Sebastian Ache M.D.   On: 07/20/2015 13:54   Dg Chest Port 1 View  08/14/2015  CLINICAL DATA:  60 year old male status post recent left thoracotomy and left upper lobectomy. EXAM: PORTABLE CHEST 1 VIEW COMPARISON:  Chest radiograph dated 08/13/2015 FINDINGS: There are two left-sided chest tubes in similar position as the prior study. There has been interval improvement of the aeration of the left lung compared to the prior study. An area of hazy density at the left lung base likely represent atelectatic changes. The right lung is clear. There is no pleural effusion. No pneumothorax identified. The cardiac silhouette is within normal limits. No acute osseous pathology. Postsurgical changes of the left lateral thoracic wall. IMPRESSION: Postsurgical changes of the left-sided left upper lobectomy with stable appearance of left-sided chest tubes. Interval improvement of aeration of the left lung. No pneumothorax. Electronically Signed   By: Elgie Collard M.D.   On: 08/14/2015 06:22   Dg Chest Port 1 View  08/13/2015  CLINICAL DATA:  60 year old male with a history of left lung mass. Status post left thoracotomy and left upper lobectomy. EXAM: PORTABLE CHEST 1 VIEW COMPARISON:  08/07/2015 FINDINGS: Interval surgical changes, with placement of 2 large poor left-sided thoracostomy tubes. First terminates at the left apex. The second terminates in the cardiophrenic sulcus. Surgical clips of the soft tissues. No visualized pneumothorax. Right to left mediastinal shift. Veiled opacity of the retrocardiac region. Right lung remains relatively well aerated. IMPRESSION: Early postoperative changes of left-sided VATS, left upper lobectomy, with 2 thoracostomy tubes and no visualized pneumothorax. Right lung relatively well aerated. Signed, Yvone Neu. Loreta Ave, DO [facility] Radiology Electronically  Signed  By: Gilmer Mor D.O.   On: 08/13/2015 14:18     CBC  Recent Labs Lab 08/14/15 0420  WBC 11.6*  HGB 13.4  HCT 40.3  PLT 182  MCV 100.6*  MCH 33.5  MCHC 33.3  RDW 14.0    Chemistries   Recent Labs Lab 08/14/15 0420  NA 137  K 4.4  CL 105  CO2 27  GLUCOSE 123*  BUN 14  CREATININE 1.00  CALCIUM 8.0*  AST 31  ALT 13*  ALKPHOS 47  BILITOT 0.7   ------------------------------------------------------------------------------------------------------------------ estimated creatinine clearance is 82.1 mL/min (by C-G formula based on Cr of 1). ------------------------------------------------------------------------------------------------------------------ No results for input(s): HGBA1C in the last 72 hours. ------------------------------------------------------------------------------------------------------------------ No results for input(s): CHOL, HDL, LDLCALC, TRIG, CHOLHDL, LDLDIRECT in the last 72 hours. ------------------------------------------------------------------------------------------------------------------ No results for input(s): TSH, T4TOTAL, T3FREE, THYROIDAB in the last 72 hours.  Invalid input(s): FREET3 ------------------------------------------------------------------------------------------------------------------ No results for input(s): VITAMINB12, FOLATE, FERRITIN, TIBC, IRON, RETICCTPCT in the last 72 hours.  Coagulation profile No results for input(s): INR, PROTIME in the last 168 hours.  No results for input(s): DDIMER in the last 72 hours.  Cardiac Enzymes No results for input(s): CKMB, TROPONINI, MYOGLOBIN in the last 168 hours.  Invalid input(s): CK ------------------------------------------------------------------------------------------------------------------ Invalid input(s): POCBNP    Assessment & Plan   * COPD No evidence of exasperation Continue nebulizers when necessary On room air  * Urinary  retentionContinue Flomax ImprovedContinue Flomax  * Left upper lobe Lung mass Status post left upper lobe resection. Therapy per Dr. Inez Catalina Oncology follow-up as outpatient.  * Scleroderma Continue  amlodipine    * anxiety continue Xanax  Ambien when necessary to sleep  * DVT prophylaxis SCDs in place        Code Status Orders        Start     Ordered   08/13/15 1529  Full code   Continuous     08/13/15 1528    Code Status History    Date Active Date Inactive Code Status Order ID Comments User Context   This patient has a current code status but no historical code status.    Advance Directive Documentation        Most Recent Value   Type of Advance Directive  Healthcare Power of Attorney, Living will   Pre-existing out of facility DNR order (yellow form or pink MOST form)     "MOST" Form in Place?                DVT Prophylaxis  scd's  Lab Results  Component Value Date   PLT 182 08/14/2015     Time Spent in minutes    Greater than 50% of time spent in care coordination and counseling patient regarding the condition and plan of care.   Auburn Bilberry M.D on 08/18/2015 at 12:02 PM  Between 7am to 6pm - Pager - [phone]  After 6pm go to [email or web address] - password [email or web address] [facility]  [facility]   Office  [phone]

## 2015-08-18 NOTE — Discharge Summary (Signed)
Physician Discharge Summary  Patient ID: LENIEL FRIEDBERG MRN: [id number] DOB/AGE: [birth year] 61 y.o.  Admit date: 08/13/2015 Discharge date: 08/18/2015  Admission Diagnoses: Left lung mass  Discharge Diagnoses:  Active Problems:   Lung mass   Discharged Condition: good  Hospital Course: 60 yr old male with metastatic renal cell cancer to the left lung.  Patient underwent Left thoracotomy with Dr. Thelma Barge on 6/19.  Patient has done well afterward.  His pain is well controlled with po pain medications.  He has been up and walking around.  He was seen by medicine and started on flomax for some urinary retention which they recommended to continue.  He had one chest tube removed yesterday.  Repeat CXR today looked good without any evidence of air leak on pleurovac, second chest tube removed today around 11am.  He has done well since, up and walking around well, no SOB, no increased pain.  He was instructed to walk at home and could take stairs but no strenuous activity or lifting more than 15lbs.  He was instructed not to drive while on po pain medications.  He needs to keep dry dressing to chest tube sites while draining serous fluid.  He is to call for appointment on Monday.   Consults: medicine  Significant Diagnostic Studies: CXR  Treatments: surgery: Left thoracotomy   Discharge Exam: Blood pressure 146/78, pulse 74, temperature 98.7 F (37.1 C), temperature source Oral, resp. rate 18, height 6\' 1"  (1.854 m), weight 165 lb (74.844 kg), SpO2 98 %. General appearance: alert, cooperative and no distress Resp: clear to auscultation bilaterally Chest wall: no tenderness, Left chest wall incision site c/d/i, chest tube sites with some serous drainage, no erythema  Cardio: regular rate and rhythm, S1, S2 normal, no murmur, click, rub or gallop GI: soft, non-tender; bowel sounds normal; no masses,  no organomegaly Extremities: extremities normal, atraumatic, no cyanosis or edema  Disposition:    Discharge Instructions    Call MD for:  persistant nausea and vomiting    Complete by:  As directed      Call MD for:  redness, tenderness, or signs of infection (pain, swelling, redness, odor or green/yellow discharge around incision site)    Complete by:  As directed      Call MD for:  severe uncontrolled pain    Complete by:  As directed      Call MD for:  temperature >100.4    Complete by:  As directed      Change dressing (specify)    Complete by:  As directed   Dressing change: 2 times per day or as needed for drainage using dry dressings.     Diet - low sodium heart healthy    Complete by:  As directed      Driving Restrictions    Complete by:  As directed   No driving while on prescription pain medication     Increase activity slowly    Complete by:  As directed      Lifting restrictions    Complete by:  As directed   No lifting over 15lbs for 6 weeks            Medication List    TAKE these medications        amLODipine 2.5 MG tablet  Commonly known as:  NORVASC  Take 1 tablet by mouth daily.     cyclobenzaprine 10 MG tablet  Commonly known as:  FLEXERIL  Take 1 tablet (10 mg  total) by mouth 3 (three) times daily as needed for muscle spasms.     ibuprofen 200 MG tablet  Commonly known as:  ADVIL,MOTRIN  Take 1 tablet by mouth every 6 (six) hours as needed.     loperamide 2 MG tablet  Commonly known as:  IMODIUM A-D  Take 2 mg by mouth 4 (four) times daily as needed for diarrhea or loose stools.     meloxicam 7.5 MG tablet  Commonly known as:  MOBIC  Take 1 tablet by mouth daily.     mupirocin nasal ointment 2 %  Commonly known as:  BACTROBAN NASAL  Place 1 application into the nose 2 (two) times daily. Use one-half of tube in each nostril twice daily for five (5) days. After application, press sides of nose together and gently massage.     nicotine 21 mg/24hr patch  Commonly known as:  EQ NICOTINE  Place 1 patch (21 mg total) onto the skin daily.      oxyCODONE-acetaminophen 7.5-325 MG tablet  Commonly known as:  PERCOCET  Take 1-2 tablets by mouth every 4 (four) hours as needed for moderate pain.     sildenafil 50 MG tablet  Commonly known as:  VIAGRA  Take 1 tablet (50 mg total) by mouth daily as needed for erectile dysfunction.     tamsulosin 0.4 MG Caps capsule  Commonly known as:  FLOMAX  Take 1 capsule (0.4 mg total) by mouth daily after breakfast.           Follow-up Information    Follow up with Hulda Marin, MD. Call in 1 week.   Specialty:  Cardiothoracic Surgery   Why:  Call office on Monday and make appointment to see Dr. Thelma Barge next week for f/u Thoracotomy    Contact information:   [address] [place] [address] [phone]       Signed: Gladis Riffle 08/18/2015, 4:09 PM

## 2015-08-18 NOTE — Progress Notes (Signed)
08/18/2015 4:49 PM  [name] to be D/C'd Home per MD order.  Discussed prescriptions and follow up appointments with the patient. Prescriptions given to patient, medication list explained in detail. Pt verbalized understanding.    Medication List    TAKE these medications        amLODipine 2.5 MG tablet  Commonly known as:  NORVASC  Take 1 tablet by mouth daily.     cyclobenzaprine 10 MG tablet  Commonly known as:  FLEXERIL  Take 1 tablet (10 mg total) by mouth 3 (three) times daily as needed for muscle spasms.     ibuprofen 200 MG tablet  Commonly known as:  ADVIL,MOTRIN  Take 1 tablet by mouth every 6 (six) hours as needed.     loperamide 2 MG tablet  Commonly known as:  IMODIUM A-D  Take 2 mg by mouth 4 (four) times daily as needed for diarrhea or loose stools.     meloxicam 7.5 MG tablet  Commonly known as:  MOBIC  Take 1 tablet by mouth daily.     mupirocin nasal ointment 2 %  Commonly known as:  BACTROBAN NASAL  Place 1 application into the nose 2 (two) times daily. Use one-half of tube in each nostril twice daily for five (5) days. After application, press sides of nose together and gently massage.     nicotine 21 mg/24hr patch  Commonly known as:  EQ NICOTINE  Place 1 patch (21 mg total) onto the skin daily.     oxyCODONE-acetaminophen 7.5-325 MG tablet  Commonly known as:  PERCOCET  Take 1-2 tablets by mouth every 4 (four) hours as needed for moderate pain.     sildenafil 50 MG tablet  Commonly known as:  VIAGRA  Take 1 tablet (50 mg total) by mouth daily as needed for erectile dysfunction.     tamsulosin 0.4 MG Caps capsule  Commonly known as:  FLOMAX  Take 1 capsule (0.4 mg total) by mouth daily after breakfast.        Filed Vitals:   08/18/15 0509 08/18/15 1318  BP: 142/66 146/78  Pulse: 69 74  Temp: 97.7 F (36.5 C) 98.7 F (37.1 C)  Resp: 18 18    Skin clean, dry and intact without evidence of skin break down, no evidence of skin  tears noted. IV catheter discontinued intact. Site without signs and symptoms of complications. Dressing and pressure applied. Pt denies pain at this time. No complaints noted.  An After Visit Summary was printed and given to the patient. Patient escorted via [place], and D/C home via private auto.  Dola Argyle

## 2015-08-20 ENCOUNTER — Other Ambulatory Visit: Payer: Self-pay | Admitting: Cardiothoracic Surgery

## 2015-08-20 DIAGNOSIS — R918 Other nonspecific abnormal finding of lung field: Secondary | ICD-10-CM

## 2015-08-21 ENCOUNTER — Telehealth: Payer: Self-pay | Admitting: Cardiothoracic Surgery

## 2015-08-21 NOTE — Telephone Encounter (Signed)
Patient had surgery with Dr Genevive Bi on Monday 08/13/15 LEFT THOROCOTOMY WITH LEFT UPPER LOBECTOMY. Patient has not had a bowel movement since surgery. He hasn't been eating a lot, but he has been drinking prune juice. He states he is not in pain from not having a bowel movement, but is becoming concerned. He would like something called in to help him produce a bowel movement, or if you have any other suggestions of what he may try. Please call at your convenience. Thanks.

## 2015-08-21 NOTE — Telephone Encounter (Signed)
Protocol below read to patient> Patient has appointment on Friday. Patient was instructed to increase water intake. Patient verbalized understanding.      Post-discharge call made to patient. Pain under control at this time. Patient does have concerns regarding constipation. Informed patient that they should increase water intake and activity level as much as possible to help with bowel movement. Also educated that they may use Miralax over the counter x 2 doses, 6 hours apart, followed by Dulcolax x 2 doses, 6 hours apart until they have a successful bowel movement. Asked patient to call back for further instructions if after taking both doses of these medications, they are still unsuccessful with a bowel movement.  Patient verbalizes understanding of this information.  Denies any other questions or concerns.  Confirmed post-op appointment information. Encouraged patient to call back with any further questions or concerns prior to appointment.

## 2015-08-24 ENCOUNTER — Other Ambulatory Visit: Payer: Self-pay

## 2015-08-24 ENCOUNTER — Ambulatory Visit
Admission: RE | Admit: 2015-08-24 | Discharge: 2015-08-24 | Disposition: A | Discharge status: Home / Self Care | Payer: BLUE CROSS/BLUE SHIELD | Source: Ambulatory Visit | Attending: Cardiothoracic Surgery | Admitting: Cardiothoracic Surgery

## 2015-08-24 ENCOUNTER — Ambulatory Visit (INDEPENDENT_AMBULATORY_CARE_PROVIDER_SITE_OTHER): Payer: BLUE CROSS/BLUE SHIELD | Admitting: Cardiothoracic Surgery

## 2015-08-24 ENCOUNTER — Encounter: Payer: Self-pay | Admitting: Cardiothoracic Surgery

## 2015-08-24 ENCOUNTER — Telehealth: Payer: Self-pay | Admitting: Cardiothoracic Surgery

## 2015-08-24 VITALS — BP 110/73 | HR 89 | Temp 98.3°F | Wt 158.0 lb

## 2015-08-24 DIAGNOSIS — R918 Other nonspecific abnormal finding of lung field: Secondary | ICD-10-CM | POA: Insufficient documentation

## 2015-08-24 DIAGNOSIS — Z9889 Other specified postprocedural states: Secondary | ICD-10-CM

## 2015-08-24 DIAGNOSIS — J90 Pleural effusion, not elsewhere classified: Secondary | ICD-10-CM | POA: Insufficient documentation

## 2015-08-24 NOTE — Telephone Encounter (Signed)
Called patient to answer his question. He stated that he just wanted to make sure that he needed to see Dr. Rogue Bussing next week. I told him that it was correct. Dr. Genevive Bi wanted him to schedule an appointment to see Dr. Rogue Bussing next week. Patient understood. I also reminded patient about his appointment with Dr. Dahlia Byes next week at our [facility] office and to go and get his X-Rays prior to his appointment. Patient understood.

## 2015-08-24 NOTE — Telephone Encounter (Signed)
Patient would like to speak with nurse about some scheduling conflicts with his appointment(s) that were scheduled. He already has an appointment with Dr Rogue Bussing on the 24th Brahmanday, but he was told at in office today with Dr Genevive Bi, and it is on his paperwork, to call and make an appointment with Dr Rogue Bussing. Is this another appointment in addition to the one he already scheduled?

## 2015-08-24 NOTE — Progress Notes (Signed)
This patient returns today in follow-up. He states he's done very well at home. He has only minimal pain. His biggest complaint is that he is constipated. He does not have any shortness of breath or fevers or chills. He did have a chest x-ray made which shows a loculated hydropneumothorax on the left which is small. He does not complain of any pulmonary symptoms although he does state he feels more tired now than he did prior to surgery.  He looks quite well. All of his wounds are healing as expected. His lungs are diminished on the left but otherwise clear. His heart regular.  We removed his sutures and Steri-Stripped his wounds. We removed all the staples as well.  I've asked him to come back and see my associate Dr. Caroleen Hamman next week with a repeat chest x-ray.  We will also make an appointment for him to follow-up with Dr. Ephraim Hamburger.  I will see him back again in 3 weeks. He did not wish to have any refills on any of his medications today

## 2015-08-24 NOTE — Patient Instructions (Addendum)
Please take Milk of Magnesium.   We will order a set of X-Ray for you to have done before your appointment on Friday. You will see Dr. Dahlia Byes after your X-Ray.  Please do not lift more than 10lbs for a month.  Please call Dr. Aletha Halim office to schedule a follow up appointment with for next week.

## 2015-08-29 ENCOUNTER — Ambulatory Visit
Admission: RE | Admit: 2015-08-29 | Discharge: 2015-08-29 | Disposition: A | Discharge status: Home / Self Care | Payer: BLUE CROSS/BLUE SHIELD | Source: Ambulatory Visit | Attending: Cardiothoracic Surgery | Admitting: Cardiothoracic Surgery

## 2015-08-29 ENCOUNTER — Encounter: Payer: Self-pay | Admitting: Surgery

## 2015-08-29 ENCOUNTER — Ambulatory Visit (INDEPENDENT_AMBULATORY_CARE_PROVIDER_SITE_OTHER): Payer: BLUE CROSS/BLUE SHIELD | Admitting: Surgery

## 2015-08-29 VITALS — BP 125/70 | HR 64 | Temp 99.1°F | Ht 73.0 in | Wt 159.0 lb

## 2015-08-29 DIAGNOSIS — Z9889 Other specified postprocedural states: Secondary | ICD-10-CM

## 2015-08-29 DIAGNOSIS — Z09 Encounter for follow-up examination after completed treatment for conditions other than malignant neoplasm: Secondary | ICD-10-CM

## 2015-08-29 DIAGNOSIS — R918 Other nonspecific abnormal finding of lung field: Secondary | ICD-10-CM | POA: Diagnosis not present

## 2015-08-29 NOTE — Progress Notes (Signed)
S/p Let Upper lobectomy 08/13/15. Doing very well CXR small hydropneumothorax  HE continues to improve, walks w/o assistance and w/o O2  PE NAD Chest : CTA, good movement of air on the left side, no evidence of tension component. Incisions c/d/i, no infection  A/P Doing well Continue wound care Anticipate it will take some time for the LEFT lower lober to fill that space.  No need for immediate intervention F/u w Dr. Genevive Bi next week

## 2015-08-29 NOTE — Patient Instructions (Signed)
Please call our office if you have any questions or concerns.  

## 2015-08-30 ENCOUNTER — Inpatient Hospital Stay: Payer: BLUE CROSS/BLUE SHIELD | Attending: Internal Medicine | Admitting: Internal Medicine

## 2015-08-30 VITALS — BP 116/69 | HR 73 | Temp 97.4°F | Resp 18 | Wt 160.3 lb

## 2015-08-30 DIAGNOSIS — F17210 Nicotine dependence, cigarettes, uncomplicated: Secondary | ICD-10-CM

## 2015-08-30 DIAGNOSIS — R911 Solitary pulmonary nodule: Secondary | ICD-10-CM

## 2015-08-30 DIAGNOSIS — Z905 Acquired absence of kidney: Secondary | ICD-10-CM | POA: Diagnosis not present

## 2015-08-30 DIAGNOSIS — I739 Peripheral vascular disease, unspecified: Secondary | ICD-10-CM | POA: Diagnosis not present

## 2015-08-30 DIAGNOSIS — C641 Malignant neoplasm of right kidney, except renal pelvis: Secondary | ICD-10-CM | POA: Insufficient documentation

## 2015-08-30 DIAGNOSIS — R634 Abnormal weight loss: Secondary | ICD-10-CM | POA: Diagnosis not present

## 2015-08-30 DIAGNOSIS — Z79899 Other long term (current) drug therapy: Secondary | ICD-10-CM | POA: Diagnosis not present

## 2015-08-30 DIAGNOSIS — I10 Essential (primary) hypertension: Secondary | ICD-10-CM | POA: Diagnosis not present

## 2015-08-30 DIAGNOSIS — J449 Chronic obstructive pulmonary disease, unspecified: Secondary | ICD-10-CM | POA: Diagnosis not present

## 2015-08-30 DIAGNOSIS — Z86010 Personal history of colonic polyps: Secondary | ICD-10-CM | POA: Insufficient documentation

## 2015-08-30 DIAGNOSIS — Z85528 Personal history of other malignant neoplasm of kidney: Secondary | ICD-10-CM | POA: Diagnosis not present

## 2015-08-30 DIAGNOSIS — M1990 Unspecified osteoarthritis, unspecified site: Secondary | ICD-10-CM

## 2015-08-30 DIAGNOSIS — C7802 Secondary malignant neoplasm of left lung: Secondary | ICD-10-CM | POA: Insufficient documentation

## 2015-08-30 DIAGNOSIS — G8918 Other acute postprocedural pain: Secondary | ICD-10-CM | POA: Diagnosis not present

## 2015-08-30 DIAGNOSIS — M349 Systemic sclerosis, unspecified: Secondary | ICD-10-CM | POA: Diagnosis not present

## 2015-08-30 MED ORDER — PAZOPANIB HCL 200 MG PO TABS: 800.0000 mg | ORAL_TABLET | Freq: Every day | ORAL | Status: DC

## 2015-08-30 NOTE — Progress Notes (Signed)
Patient had lobectomy on June 19.  Recovering nicely.  Still has a little tingling in the incision area.

## 2015-08-30 NOTE — Progress Notes (Signed)
[place] NOTE  Patient Care Team: Leone Haven, MD as PCP - General (Family Medicine)  CHIEF COMPLAINTS/PURPOSE OF CONSULTATION:   Oncology History   # JULY 2017 METASTATIC RCC [s/p LUL lung section;Dr.Oaks]; RLL- nodule ~94m  # June 2017- ? Frontal lesion on CT [cannot have MRI]  # RIGHT KIDNEY CANCER [incidental 2012; diverticulitis] pT3a (4.3x4.3x 3.2cm) [[facility] clear cell G-2; Neg margins; May 2012 ]  # ? Scleroderma [Almodipine; Dr.Kernodle]     Primary cancer of right kidney with metastasis from kidney to other site ([facility]   08/30/2015 Initial Diagnosis Primary cancer of right kidney with metastasis from kidney to other site ([facility]     HISTORY OF PRESENTING ILLNESS:  Thomas [id number].  male a very pleasant patient long-standing history of smoking; and a history of kidney cancer status post nephrectomy in 2012; Incidentally noted to have left upper lobe lung nodules status post resection is here to review the pathology next plan of care.  He has done very well post left upper lobectomy no postoperative complications. Denies any fevers or chills. Wound healing well. No infections.   His concerned about weight loss; lost about 10 pounds since this past surgery. Has a good appetite.  Patient has chronic mild shortness of breath. Otherwise denies any COPD. Denies any cough or hemoptysis.   . Denies any headaches. Denies any vision changes.  ROS: A complete 10 point review of system is done which is negative except mentioned above in history of present illness  MEDICAL HISTORY:  Past Medical History  Diagnosis Date  . Arthritis   . Colitis     Had blood in his stool with this and treated in the hospital  . Diverticulitis   . [place]itis ([place]   . Allergic rhinitis   . Kidney disease   . Genital herpes   . Colon polyps   . Anxiety   . Depression   . ED (erectile dysfunction)   . Colitis cystica profunda   . Occipital  lymphadenopathy   . [place]al cancer ([place]   . Peripheral vascular disease ([place]   . Complication of anesthesia     when waking up get claustophobic with mask, anxiety, panic  . Shortness of breath dyspnea   . Medical history non-contributory   . Hypertension   . Asthma     SURGICAL HISTORY: Past Surgical History  Procedure Laterality Date  . Kidney surgery Right 2012    Nephrectomy  . Partial colectomy  2012    For perforated colon related to diverticulitis- Dr. BMarina Gravel . Appendectomy  1966  . Vasectomy  2000  . Video assisted thoracoscopy (vats)/thorocotomy Left 08/13/2015    Procedure: LEFT THOROCOTOMY WITH LEFT UPPER LOBECTOMY, PREOP BRONCHOSCOPY;  Surgeon: TNestor Lewandowsky MD;  Location: [facility] ORS;  Service: General;  Laterality: Left;  . Colon surgery      SOCIAL HISTORY: Social History   Social History  . Marital Status: Married    Spouse Name: N/A  . Number of Children: N/A  . Years of Education: N/A   Occupational History  . Not on file.   Social History Main Topics  . Smoking status: Current Every Day Smoker -- 0.50 packs/day for 30 years    Types: Cigarettes  . Smokeless tobacco: Never Used  . Alcohol Use: 3.0 oz/week    0 Standard drinks or equivalent, 5 Cans of beer per week     Comment: 5 beers a night   .  Drug Use: No  . Sexual Activity: Yes   Other Topics Concern  . Not on file   Social History Narrative    FAMILY HISTORY: Family History  Problem Relation Age of Onset  . Arthritis/Rheumatoid      Parent  . Heart disease      Grandparent  . Alcoholism      Other relative  . Arthritis Mother     Rhematoid    ALLERGIES:  has No Known Allergies.  MEDICATIONS:  Current Outpatient Prescriptions  Medication Sig Dispense Refill  . amLODipine (NORVASC) 2.5 MG tablet Take 1 tablet by mouth daily.    . cyclobenzaprine (FLEXERIL) 10 MG tablet Take 1 tablet (10 mg total) by mouth 3 (three) times daily as needed for muscle spasms. 30 tablet 0  .  ibuprofen (ADVIL,MOTRIN) 200 MG tablet Take 1 tablet by mouth every 6 (six) hours as needed.    . loperamide (IMODIUM A-D) 2 MG tablet Take 2 mg by mouth 4 (four) times daily as needed for diarrhea or loose stools.    . meloxicam (MOBIC) 7.5 MG tablet Take 1 tablet by mouth daily.    . mupirocin nasal ointment (BACTROBAN NASAL) 2 % Place 1 application into the nose 2 (two) times daily. Use one-half of tube in each nostril twice daily for five (5) days. After application, press sides of nose together and gently massage. 10 g 0  . mupirocin ointment (BACTROBAN) 2 % 1 application.  0  . nicotine (EQ NICOTINE) 21 mg/24hr patch Place 1 patch (21 mg total) onto the skin daily. 28 patch 0  . oxyCODONE-acetaminophen (PERCOCET) 7.5-325 MG tablet Take 1-2 tablets by mouth every 4 (four) hours as needed for moderate pain. 30 tablet 0  . sildenafil (VIAGRA) 50 MG tablet Take 1 tablet (50 mg total) by mouth daily as needed for erectile dysfunction. 5 tablet 0  . tamsulosin (FLOMAX) 0.4 MG CAPS capsule Take 1 capsule (0.4 mg total) by mouth daily after breakfast. 30 capsule 0  . pazopanib (VOTRIENT) 200 MG tablet Take 4 tablets (800 mg total) by mouth daily. Take on an empty stomach. 120 tablet 4   Current Facility-Administered Medications  Medication Dose Route Frequency Provider Last Rate Last Dose  . vancomycin (VANCOCIN) 1,000 mg in sodium chloride 0.9 % 500 mL IVPB  1,000 mg Intravenous Once Nestor Lewandowsky, MD          .  PHYSICAL EXAMINATION: ECOG PERFORMANCE STATUS: 0 - Asymptomatic  Filed Vitals:   08/30/15 1544  BP: 116/69  Pulse: 73  Temp: 97.4 F (36.3 C)  Resp: 18   Filed Weights   08/30/15 1544  Weight: 160 lb 4.4 oz (72.7 kg)    GENERAL: Well-nourished well-developed; Alert, no distress and comfortable. With Family. EYES: no pallor or icterus OROPHARYNX: no thrush or ulceration; good dentition  NECK: supple, no masses felt LYMPH:  no palpable lymphadenopathy in the cervical,  axillary or inguinal regions LUNGS: clear to auscultation and  No wheeze or crackles HEART/CVS: regular rate & rhythm and no murmurs; No lower extremity edema ABDOMEN: abdomen soft, non-tender and normal bowel sounds Musculoskeletal:no cyanosis of digits and no clubbing  PSYCH: alert & oriented x 3 with fluent speech NEURO: no focal motor/sensory deficits SKIN:  no rashes or significant lesions; left upper chest wall- thoracotomy lesion healing well.  LABORATORY DATA:  I have reviewed the data as listed Lab Results  Component Value Date   WBC 11.6* 08/14/2015   HGB 13.4  08/14/2015   HCT 40.3 08/14/2015   MCV 100.6* 08/14/2015   PLT 182 08/14/2015    Recent Labs  06/12/15 0948 08/14/15 0420  NA 137 137  K 4.7 4.4  CL 103 105  CO2 28 27  GLUCOSE 91 123*  BUN 17 14  CREATININE 1.20 1.00  CALCIUM 9.5 8.0*  GFRNONAA  --  >60  GFRAA  --  >60  PROT 7.1 5.6*  ALBUMIN 4.1 3.3*  AST 24 31  ALT 14 13*  ALKPHOS 56 47  BILITOT 0.7 0.7    RADIOGRAPHIC STUDIES: I have personally reviewed the radiological images as listed and agreed with the findings in the report. Dg Chest 2 View  08/29/2015  CLINICAL DATA:  Left upper lobectomy for resection of left upper lobe lung carcinoma, followup EXAM: CHEST  2 VIEW COMPARISON:  Chest x-ray of 08/24/2015 and CT chest of 07/05/2015 FINDINGS: There is a probable loculated anterior left hydro pneumothorax present with more fluid noted currently. Volume loss on the left is stable. The right lung is clear. Heart size is stable. No bony abnormality is seen. IMPRESSION: Air-fluid level in the anterior left upper hemi thorax consistent with loculated left anterior hydro pneumothorax after left upper lobe resection. Electronically Signed   By: Ivar Drape M.D.   On: 08/29/2015 10:07   Dg Chest 2 View  08/24/2015  CLINICAL DATA:  Left upper lobectomy. EXAM: CHEST  2 VIEW COMPARISON:  08/18/2015. FINDINGS: Surgical staples noted over the left chest. Left  upper lobectomy. Interim removal of left chest tube. Interval progression of left pneumothorax. Left anterior pleural effusion resulting in left hydro pneumothorax noted. No evidence of tension. Heart size normal. Mild left chest wall subcutaneous emphysema. No acute bony abnormality. IMPRESSION: Left upper lobectomy. Interim removal of left chest tube. Left apical pneumothorax has increased from prior exam. A left anterior pleural effusion is noted resulting in the left hydro pneumothorax. No tension. These results will be called to the ordering clinician or representative by the Radiologist Assistant, and communication documented in the PACS or zVision Dashboard. Electronically Signed   By: Marcello Moores  Register   On: 08/24/2015 09:42   Dg Chest 2 View  08/18/2015  CLINICAL DATA:  Post left thoracotomy EXAM: CHEST  2 VIEW COMPARISON:  08/16/2012 FINDINGS: Cardiomediastinal silhouette is stable. Again noted status post left upper lobectomy. Left chest tube is unchanged in position. Tiny left apical pneumothorax. Stable left basilar atelectasis. Skin staples in left lower /axillary chest wall again noted. Right lung is clear. Bony thorax is unremarkable. No pulmonary edema. IMPRESSION: Stable postsurgical changes post left upper lobectomy. Stable left chest tube position. Tiny left apical pneumothorax. Again noted mild left basilar atelectasis. Right lung is clear. Electronically Signed   By: Lahoma Crocker M.D.   On: 08/18/2015 11:49   Dg Chest 2 View  08/17/2015  CLINICAL DATA:  Postop left thoracotomy, left upper lobectomy EXAM: CHEST  2 VIEW COMPARISON:  08/14/2005 FINDINGS: Left chest tubes remain in place. No visible pneumothorax. Postoperative changes on the left. Right lung is clear. Heart is normal size. IMPRESSION: Left chest tubes remain in place with postoperative changes on the left. No visible pneumothorax. Electronically Signed   By: Rolm Baptise M.D.   On: 08/17/2015 11:54   Dg Chest 2  View  08/15/2015  CLINICAL DATA:  Follow-up left upper lobectomy. Left thoracotomy performed on 08/13/2015. Left chest tube. EXAM: CHEST  2 VIEW COMPARISON:  08/14/2015 FINDINGS: 2 left chest tubes remain  in place, unchanged. No pneumothorax. Postoperative changes on the left. No confluent airspace opacities in the lungs. No effusions. Minimal left chest wall subcutaneous emphysema. IMPRESSION: Stable position of the left chest tubes.  No pneumothorax. Electronically Signed   By: Rolm Baptise M.D.   On: 08/15/2015 14:27   Dg Chest 2 View  08/07/2015  CLINICAL DATA:  Lung cancer, smoker.  Preoperative for surgery. EXAM: CHEST  2 VIEW COMPARISON:  PET-CT dated 07/12/2015. FINDINGS: Heart size is normal. Left upper lobe nodule measures approximately 2 cm greatest dimension, more definitively measured on recent PET-CT in 2.5 x 1.7 cm, compatible with given history of lung cancer. Lungs otherwise clear. No pleural effusion seen. Osseous structures about the chest are unremarkable. IMPRESSION: 1. Left upper lobe pulmonary nodule, measured more definitively on recent PET-CT at 2.5 x 1.7 cm, compatible with given history of lung cancer. 2. No other acute/significant findings. Electronically Signed   By: Franki Cabot M.D.   On: 08/07/2015 13:35   Dg Chest Port 1 View  08/14/2015  CLINICAL DATA:  60 year old male status post recent left thoracotomy and left upper lobectomy. EXAM: PORTABLE CHEST 1 VIEW COMPARISON:  Chest radiograph dated 08/13/2015 FINDINGS: There are two left-sided chest tubes in similar position as the prior study. There has been interval improvement of the aeration of the left lung compared to the prior study. An area of hazy density at the left lung base likely represent atelectatic changes. The right lung is clear. There is no pleural effusion. No pneumothorax identified. The cardiac silhouette is within normal limits. No acute osseous pathology. Postsurgical changes of the left lateral thoracic  wall. IMPRESSION: Postsurgical changes of the left-sided left upper lobectomy with stable appearance of left-sided chest tubes. Interval improvement of aeration of the left lung. No pneumothorax. Electronically Signed   By: Anner Crete M.D.   On: 08/14/2015 06:22   Dg Chest Port 1 View  08/13/2015  CLINICAL DATA:  60 year old male with a history of left lung mass. Status post left thoracotomy and left upper lobectomy. EXAM: PORTABLE CHEST 1 VIEW COMPARISON:  08/07/2015 FINDINGS: Interval surgical changes, with placement of 2 large poor left-sided thoracostomy tubes. First terminates at the left apex. The second terminates in the cardiophrenic sulcus. Surgical clips of the soft tissues. No visualized pneumothorax. Right to left mediastinal shift. Veiled opacity of the retrocardiac region. Right lung remains relatively well aerated. IMPRESSION: Early postoperative changes of left-sided VATS, left upper lobectomy, with 2 thoracostomy tubes and no visualized pneumothorax. Right lung relatively well aerated. Signed, Dulcy Fanny. Earleen Newport, DO [facility] Electronically Signed   By: Corrie Mckusick D.O.   On: 08/13/2015 14:18    ASSESSMENT & PLAN:   Primary cancer of right kidney with metastasis from kidney to other site [facility]) # METASTATIC RENAL CELL CA s/p resection of LUL lung nodule. Reviewed the pathology with the patient and family in detail. Given the recurrence in the lung this is stage IV usual incurable. However given his long disease-free interval; and status post metastasectomy patient can have long survivals.  Patient has right lower lobe small lung nodule that'll be monitored.   # Recommend first-line tyrosine kinase inhibitor- Votrient. Discussed the potential side effects including but not limited to elevated blood pressure wound healing problems, diarrhea mucositis and hand-foot syndrome; elevated Lfts etc.   #  given the patient's  excellent performance status/ and its desired to be aggressive - I discussed the option of clinical  trials if available- especially dual immunotherapy with ipi+ PDL/PD inhibitors.   # Scleroderma- currently stable on Norvasc; followed by dermatology. This could be a potential issue with immunotherapy.   # Patient follow-up with me in approximately 3 weeks; we'll send out a prescription for Votrient.   # The above plan of care was discussed the patient and his family in detail. All questions were answered.  We will check a CBC CMP and LDH at next visit; prior to starting Votrient.       Cammie Sickle, MD 08/30/2015 6:12 PM

## 2015-08-30 NOTE — Assessment & Plan Note (Addendum)
#   METASTATIC RENAL CELL CA s/p resection of LUL lung nodule. Reviewed the pathology with the patient and family in detail. Given the recurrence in the lung this is stage IV usual incurable. However given his long disease-free interval; and status post metastasectomy patient can have long survivals.  Patient has right lower lobe small lung nodule that'll be monitored.   # Recommend first-line tyrosine kinase inhibitor- Votrient. Discussed the potential side effects including but not limited to elevated blood pressure wound healing problems, diarrhea mucositis and hand-foot syndrome; elevated Lfts etc.   #  given the patient's excellent performance status/ and its desired to be aggressive - I discussed the option of clinical trials if available- especially dual immunotherapy with ipi+ PDL/PD inhibitors.   # Scleroderma- currently stable on Norvasc; followed by dermatology. This could be a potential issue with immunotherapy.   # Patient follow-up with me in approximately 3 weeks; we'll send out a prescription for Votrient.   # The above plan of care was discussed the patient and his family in detail. All questions were answered.  We will check a CBC CMP and LDH at next visit; prior to starting Votrient.

## 2015-09-03 ENCOUNTER — Encounter: Payer: Self-pay | Admitting: *Deleted

## 2015-09-03 NOTE — Progress Notes (Signed)
votriet rx faxed to [facility] 09/03/15

## 2015-09-04 ENCOUNTER — Encounter: Payer: Self-pay | Admitting: Cardiothoracic Surgery

## 2015-09-04 ENCOUNTER — Ambulatory Visit
Admission: RE | Admit: 2015-09-04 | Discharge: 2015-09-04 | Disposition: A | Discharge status: Home / Self Care | Payer: BLUE CROSS/BLUE SHIELD | Source: Ambulatory Visit | Attending: Cardiothoracic Surgery | Admitting: Cardiothoracic Surgery

## 2015-09-04 ENCOUNTER — Ambulatory Visit (INDEPENDENT_AMBULATORY_CARE_PROVIDER_SITE_OTHER): Payer: BLUE CROSS/BLUE SHIELD | Admitting: Cardiothoracic Surgery

## 2015-09-04 VITALS — BP 117/72 | HR 77 | Temp 97.5°F | Wt 163.0 lb

## 2015-09-04 DIAGNOSIS — R918 Other nonspecific abnormal finding of lung field: Secondary | ICD-10-CM | POA: Insufficient documentation

## 2015-09-04 NOTE — Patient Instructions (Signed)
Please go and have your X-Ray done today.  We will see you back in three weeks.

## 2015-09-04 NOTE — Progress Notes (Signed)
Thomas Le returns today in follow-up. He states he's been doing quite well. He does not complain of any shortness of breath. He did see Dr. Adora Fridge last week. He denies any fevers or chills.  His vital signs are normal. His thoracotomy wound is well approximated without erythema or drainage. His lungs are equal bilaterally. There are slightly diminished on the left. His heart rate is normal.  He did see the oncologist who recommended immunotherapy. He will continue to follow-up with them. I will see him back again in 2 weeks. He will have a chest x-ray made today and will call tomorrow for those results.

## 2015-09-05 ENCOUNTER — Telehealth: Payer: Self-pay | Admitting: Cardiothoracic Surgery

## 2015-09-05 NOTE — Telephone Encounter (Signed)
Patient saw Dr Genevive Bi in the office yesterday. He had a chest x-ray after his appointment. He was told by Dr Genevive Bi to call today for those results. Please call patient at your convenience.

## 2015-09-05 NOTE — Telephone Encounter (Signed)
Called patient back to let him know that his X-Ray results were good. I also reminded patient of his appointment with Dr. Genevive Bi on 09/25/2015. Patient understood.

## 2015-09-11 ENCOUNTER — Telehealth: Payer: Self-pay | Admitting: *Deleted

## 2015-09-11 NOTE — Telephone Encounter (Signed)
[facility] contacted patient to arrange for shipment for Votrient.  Patient told [facility] that he would not accept shipment at this time.  [facility] said that the patient didn't want to take a telemarker call. [facility] states that the patient seemed confused about the Votrient rx.  I attempted to call patient back this morning at 1039. Unable to leave vm. Phone just rings.  1057-left vm on pts home ph- asking patient to call cancer center asap. I provided the both the [place] office number.  1059-attempted to contact pt's work-vm came on phone "thank you for calling [facility]." No msg left due to hippa/privacy precautions.  Once patient can be reached. MD/RN will need to officially consent the patient for votrient treatment and provide reinforced education.

## 2015-09-12 ENCOUNTER — Encounter: Payer: Self-pay | Admitting: *Deleted

## 2015-09-12 ENCOUNTER — Telehealth: Payer: Self-pay | Admitting: *Deleted

## 2015-09-12 ENCOUNTER — Other Ambulatory Visit: Payer: Self-pay | Admitting: Internal Medicine

## 2015-09-12 NOTE — Telephone Encounter (Signed)
Patient returning call from yesterday, states he missed call from Dr. Agnes Lawrence nurse. Patient report he received his medication in the mail but has not started it yet. He states he will be home for the rest of today if you can call him back at his convenience.

## 2015-09-12 NOTE — Progress Notes (Signed)
Patient came to cancer center to sign consent for Votrient. RN verified correct dosage of medication received.  Patient provided with literature-pt education from [employer] regarding the drug medication side effects.  Discussed with patient to take drug on empty stomach 1 hr before eating or 2 hrs after eating.  Discussed with patient-when to contact provider for side effects. Pt has imodium and antiemetics at home. He still c/o of neuropathic pain at surgical site.  Pt only taking ibuprofen 200 mg prn pain. Pt has caused interrupted sleep pattern. Pt reports that his Neuropathic pain at excision site is worse at night. "it feels like something is stinging and burning. Like pins and needles" pt states that he has 7 percocet tablets at home. He stated, "I just don't like taking narcotics."  I explained and reassured pt that the neuropathic pain is completely expected thoracotomy/thorascopy post op side effect and could last up to 3-4 months. I reminded pt that he heals faster on the outer excisions.  I explained that he could take his percocet tab at night to see if this helps improve his pain. He stated that he will try this tonight to see if there is improvement in his pain.  Teach back process performed       Votrient    (vo-tree-ant) Generic Name: Pazopanib Trade Name(s): Votrient  Pazopanib is the generic name for the trade drug Votrient. In some cases, health care professionals may use the trade name votrient when referring to the generic drug name Pazopanib.  Drug Type: Votrient is a targeted therapy. Votrient is classified as a Tyrosine Kinase inhibitor; Vascular Endothelial Growth Factor (VEGF) inhibitor. (For more detail, see "How this drug works," below.)  What Votrient Is Used For: Votrient is used for the treatment of advanced renal cell carcinoma  Note: If a drug has been approved for one use, physicians may elect to use this same drug for other problems if they believe it may  be helpful.  How Votrient Is Given: The amount of Votrient that you will receive depends on many factors, including your general health or other health problems, and the type of cancer or condition you have. Your doctor will determine your exact Votrient dosage and schedule. Votrient is given in tablet form to be taken by mouth on an empty stomach, at least one hour before or two hours after eating. Side Effects: Important things to remember about the side effects of Votrient: Most people will not experience all of the Votrient side effects listed.  Votrient side effects are often predictable in terms of their onset, duration, and severity.  Votrient side effects are almost always reversible and will go away after therapy is complete.  Votrient side effects may be quite manageable. There are many options to minimize or prevent the side effects of Votrient. The following Votrient side effects are common (occurring in greater than 30%) for patients taking Votrient: Diarrhea  Hypertension  Hair color changes  Low blood counts (low white blood cells, low platelets)  Elevated liver function tests (AST, ALT)  Elevated bilirubin level  Blood test abnormalities (low phosporus, low sodium, increased glucose)  These are less common side effects (occurring in 10-29%) for patients receiving Votrient: Nausea  Vomiting  Poor appetite  Fatigue  Weakness  Abdominal pain  Headache  Blood test abnormalities  Low Magnesium  Low Glucose - Low Blood Sugar This list includes common and less common side effects for those taking Votrient. Votrient side effects that are very rare --  occurring in less than about 10 percent of patients -- are not listed here. Always inform your health care provider if you experience any unusual symptoms. When to contact your doctor or health care provider: Contact your doctor or health care provider immediately, day or night, if you should experience any of the following  symptoms: Fever of 100.4 F (38 C) or higher, chills (possible signs of infection)  Yellowing or jaundice of the skin or whites of the eyes  The following symptoms require medical attention, but are not an emergency. Contact your doctor or health care provider within 24 hours of noticing any of the following: Diarrhea (4-6 episodes in a 24-hour period)  Hypertension (systolic BP > 270 (top number) or diastolic BP > 90 (bottom number)  Nausea (interferes with ability to eat and unrelieved with prescribed medication)  Vomiting (vomiting more than 4-5 times in a 24 hour period)  Unusual bleeding or bruising  Black or tarry stools or blood in your stools  Blood in your urine  Pain or burning with urination  Extreme fatigue (unable to carry on self-care activities)  Mouth sores (painful redness, swelling or ulcers) Always inform your doctor or health care provider if you experience any unusual symptoms. Precautions: Before starting Votrient treatment, make sure you tell your doctor about any other medications you are taking (including prescription, over-the-counter, vitamins, herbal remedies, etc.). While taking Votrient, ask your doctor before you take aspirin or products containing aspirin.  While taking Votrient, do not receive any kind of immunization or vaccination without your doctor's approval.  Inform your health care professional if you are pregnant or may be pregnant prior to starting this treatment. Pregnancy category D (may be hazardous to the fetus. Women who are pregnant or become pregnant must be advised of the potential hazard to the fetus.)  For both men and women: Use contraceptives and do not conceive a child (get pregnant) while taking Votrient. Barrier methods of contraception such as condoms are recommended.  Do not breast feed while taking Votrient Self-Care Tips: While taking Votrient, drink at least two to three quarts of fluid every 24 hours, unless you are instructed  otherwise.  Wash your hands often and after taking each dose of Votrient.  To help treat/prevent mouth sores while taking Votrient, use a soft toothbrush, and rinse three times a day with 1 teaspoon of baking soda mixed with 8 ounces of water.  Use an electric razor and a soft toothbrush to minimize bleeding.  Avoid contact sports or activities that could cause injury.  To reduce nausea, take anti-nausea medications as prescribed by your doctor, and eat small, frequent meals while taking Votrient.  Avoid sun exposure. Wear SPF 71 (or higher) sunblock and protective clothing. Votrient may make you more sensitive to the sun and you may sunburn more easily.  In general, drinking alcoholic beverages should be kept to a minimum or avoided completely while you are taking Votrient. You should discuss this with your doctor.  Get plenty of rest.  Maintain good nutrition while being treated with Votrient.  If you experience symptoms or side effects while being treated with Votrient, be sure to discuss them with your health care team. They can prescribe medications and/or offer other suggestions that are effective in managing such problems. Monitoring and Testing: You will be checked regularly by your doctor while you are taking Votrient to monitor side effects and check your response to therapy. Periodic blood work will be obtained to monitor your complete  blood count (CBC) as well as the function of other organs (such as your kidneys and liver).  How Votrient Works: Votrient is not a chemotherapy drug but one of many "targeted therapies." Targeted therapy is the result of about 100 years of research dedicated to understanding the differences between cancer cells and normal cells. To date, cancer treatment has focused primarily on killing rapidly dividing cells because one feature of cancer cells is that divide rapidly. Unfortunately, some of our normal cells divide rapidly too, causing multiple side effects.    Targeted therapy is about identifying other features of cancer cells. Scientists look for specific differences in the cancer cells and the normal cells. This information is used to create a targeted therapy to attack the cancer cells without damaging the normal cells, thus leading to fewer side effects. Each type of targeted therapy works a little bit differently but all interfere with the ability of the cancer cell to grow, divide, repair and/or communicate with other cells.  There are different types of targeted therapies, defined in three broad categories. Some targeted therapies focus on the internal components and function of the cancer cell. The targeted therapies use small molecules that can get into the cell and disrupt the function of the cells, causing them to die. There are several types of targeted therapy that focus on the inner parts of the cells. Other targeted therapies target receptors that are on the outside of the cell. Therapies that target receptors are also known as monoclonal antibodies. Antiangiogenesis inhibitors target the blood vessels that supply oxygen to the cells, ultimately causing the cells to starve. Votrient is designed to block tumor cell growth in several ways. Votrient targets several proteins (called tyrosine kinases) on the surface of cancer cells, as well as targets within the cell. Several of these targets are thought to be involved in angiogenesis (making of blood vessels). By blocking these targets, it is hoped the cancer will shrink. Research continues to identify which cancers may be best treated with targeted therapies and to identify additional targets for more types of cancer. Note: We strongly encourage you to talk with your health care professional about your specific medical condition and treatments. The information contained in this web site is meant to be helpful and educational, but is not a substitute for medical advice.

## 2015-09-12 NOTE — Telephone Encounter (Signed)
Pt received medication-Votrient in mail today. Has not started drug. Pt will come on 09/12/15 at 1330 for consent and patient education by RN.

## 2015-09-13 ENCOUNTER — Ambulatory Visit (INDEPENDENT_AMBULATORY_CARE_PROVIDER_SITE_OTHER): Payer: BLUE CROSS/BLUE SHIELD | Admitting: Family Medicine

## 2015-09-13 ENCOUNTER — Encounter: Payer: Self-pay | Admitting: Family Medicine

## 2015-09-13 VITALS — BP 108/66 | HR 78 | Temp 98.6°F | Wt 164.6 lb

## 2015-09-13 DIAGNOSIS — M349 Systemic sclerosis, unspecified: Secondary | ICD-10-CM

## 2015-09-13 DIAGNOSIS — F32A Depression, unspecified: Secondary | ICD-10-CM

## 2015-09-13 DIAGNOSIS — C641 Malignant neoplasm of right kidney, except renal pelvis: Secondary | ICD-10-CM

## 2015-09-13 DIAGNOSIS — F329 Major depressive disorder, single episode, unspecified: Secondary | ICD-10-CM

## 2015-09-13 DIAGNOSIS — F419 Anxiety disorder, unspecified: Secondary | ICD-10-CM

## 2015-09-13 DIAGNOSIS — F418 Other specified anxiety disorders: Secondary | ICD-10-CM | POA: Diagnosis not present

## 2015-09-13 DIAGNOSIS — C799 Secondary malignant neoplasm of unspecified site: Secondary | ICD-10-CM | POA: Diagnosis not present

## 2015-09-13 NOTE — Patient Instructions (Signed)
Nice to see you. You're doing quite well after surgery. Please continue to follow with the oncologist and surgeon. Please continue your amlodipine and meloxicam. You should stop the ibuprofen given that you are on meloxicam. If he develops stomach upset please let us know. If you develop chest pain, shortness of breath, anxiety, or depression, or any new or changing symptoms please seek medical attention.

## 2015-09-13 NOTE — Progress Notes (Signed)
Pre visit review using our clinic review tool, if applicable. No additional management support is needed unless otherwise documented below in the visit note. 

## 2015-09-13 NOTE — Assessment & Plan Note (Signed)
Followed by rheumatology. Significantly better on amlodipine and meloxicam. He'll continue these medications. He'll continue to monitor.

## 2015-09-13 NOTE — Assessment & Plan Note (Signed)
Doing quite well at this time. He will continue to monitor.

## 2015-09-13 NOTE — Assessment & Plan Note (Addendum)
Doing well status post surgical resection of metastasis. He will continue to follow with oncology and surgery. Discussed avoiding taking multiple types of NSAIDs at the same time. He will stick with the meloxicam for pain control at this time. Given return precautions.

## 2015-09-13 NOTE — Progress Notes (Signed)
  [name], MD Phone: [phone]  Thomas Le is a 60 y.o. male who presents today for follow-up.  Renal cell carcinoma with metastasis to his lung: Patient recently diagnosed with lung mass. Found to be related to his renal cell cancer. He is doing well status post surgical removal. They started him on Votrient recently. Has been having some pain around the surgical site though this is improved today and has no pain at this time. Has been breathing okay. His done lung function tests that he reports were good. Has been taking ibuprofen on top of meloxicam. Ibuprofen 200 mg 2-3 times a day. Had been taking Percocet though has not taken this in several weeks. He continues to follow with oncology and surgery.  Anxiety/depression: Notes these are both doing quite well particularly given his circumstances. Very little anxiety at this time. No depression.  Scleroderma: Diagnosed by rheumatology. They have him on meloxicam and amlodipine. His fingers started to improve significantly. They do turn a little blue when they get cold though otherwise scaliness and swelling in them have resolved.  PMH: Smoker   ROS see history of present illness  Objective  Physical Exam Filed Vitals:   09/13/15 0830  BP: 108/66  Pulse: 78  Temp: 98.6 F (37 C)    BP Readings from Last 3 Encounters:  09/13/15 108/66  09/04/15 117/72  08/30/15 116/69   Wt Readings from Last 3 Encounters:  09/13/15 164 lb 9.6 oz (74.662 kg)  09/04/15 163 lb (73.936 kg)  08/30/15 160 lb 4.4 oz (72.7 kg)    Physical Exam  Constitutional: No distress.  HENT:  Head: Normocephalic and atraumatic.  Cardiovascular: Normal rate, regular rhythm and normal heart sounds.   Pulmonary/Chest: Effort normal and breath sounds normal.  Left-sided thoracotomy scar well healing with no erythema or swelling, there is mild tenderness over the anterior aspect of the scar and somewhat over the anterior ribs  Musculoskeletal:    Bilateral fingers slightly blue and mildly cold (patient reports these were normal prior to coming into the cold building), no ulcerations  Neurological: He is alert. Gait normal.  Skin: Skin is warm and dry. He is not diaphoretic.     Assessment/Plan: Please see individual problem list.  Primary cancer of right kidney with metastasis from kidney to other site [facility]) Doing well status post surgical resection of metastasis. He will continue to follow with oncology and surgery. Discussed avoiding taking multiple types of NSAIDs at the same time. He will stick with the meloxicam for pain control at this time. Given return precautions.  Anxiety and depression Doing quite well at this time. He will continue to monitor.  Scleroderma ([place]) Followed by rheumatology. Significantly better on amlodipine and meloxicam. He'll continue these medications. He'll continue to monitor.    [name], MD [place]

## 2015-09-14 ENCOUNTER — Telehealth: Payer: Self-pay

## 2015-09-14 MED ORDER — GABAPENTIN 300 MG PO CAPS: 300.0000 mg | ORAL_CAPSULE | Freq: Three times a day (TID) | ORAL | Status: DC

## 2015-09-14 NOTE — Telephone Encounter (Signed)
Received notification from Dr. Aletha Halim nurse that patient presented to their clinic with Neuropathic pain at the site of his Thoracotomy. Patient does not agree to take narcotic prescribed for pain but would like for pain to be addressed by non-narcotic measures. Spoke with Dr. Genevive Bi in regards to patient. He would like to order patient Gabapentin '300mg'$  TID until his appointment scheduled on 8/4 and then reassess at that time.   Medication was sent to preferred pharmacy at this time.  Call was made to home and work number without success. Voicemail was left for return phone call on both numbers.

## 2015-09-14 NOTE — Telephone Encounter (Signed)
Phone call made to patient's home again at this time. Spoke with wife Santiago Glad and explained all information below. She will pick medication up and have patient take this until he sees Dr. Genevive Bi.  Will call with any other questions or concerns.

## 2015-09-17 ENCOUNTER — Inpatient Hospital Stay (HOSPITAL_BASED_OUTPATIENT_CLINIC_OR_DEPARTMENT_OTHER): Payer: BLUE CROSS/BLUE SHIELD | Admitting: Internal Medicine

## 2015-09-17 ENCOUNTER — Inpatient Hospital Stay: Payer: BLUE CROSS/BLUE SHIELD

## 2015-09-17 VITALS — BP 131/82 | HR 67 | Temp 99.1°F | Resp 18 | Wt 162.4 lb

## 2015-09-17 DIAGNOSIS — J449 Chronic obstructive pulmonary disease, unspecified: Secondary | ICD-10-CM

## 2015-09-17 DIAGNOSIS — F17210 Nicotine dependence, cigarettes, uncomplicated: Secondary | ICD-10-CM

## 2015-09-17 DIAGNOSIS — C7802 Secondary malignant neoplasm of left lung: Secondary | ICD-10-CM | POA: Diagnosis not present

## 2015-09-17 DIAGNOSIS — Z79899 Other long term (current) drug therapy: Secondary | ICD-10-CM

## 2015-09-17 DIAGNOSIS — Z905 Acquired absence of kidney: Secondary | ICD-10-CM

## 2015-09-17 DIAGNOSIS — C641 Malignant neoplasm of right kidney, except renal pelvis: Secondary | ICD-10-CM

## 2015-09-17 DIAGNOSIS — I739 Peripheral vascular disease, unspecified: Secondary | ICD-10-CM

## 2015-09-17 DIAGNOSIS — R634 Abnormal weight loss: Secondary | ICD-10-CM

## 2015-09-17 DIAGNOSIS — R911 Solitary pulmonary nodule: Secondary | ICD-10-CM

## 2015-09-17 DIAGNOSIS — G8918 Other acute postprocedural pain: Secondary | ICD-10-CM

## 2015-09-17 DIAGNOSIS — M349 Systemic sclerosis, unspecified: Secondary | ICD-10-CM

## 2015-09-17 DIAGNOSIS — I10 Essential (primary) hypertension: Secondary | ICD-10-CM

## 2015-09-17 DIAGNOSIS — Z85528 Personal history of other malignant neoplasm of kidney: Secondary | ICD-10-CM

## 2015-09-17 LAB — COMPREHENSIVE METABOLIC PANEL
ALBUMIN: 4 g/dL (ref 3.5–5.0)
ALK PHOS: 73 U/L (ref 38–126)
ALT: 12 U/L — ABNORMAL LOW (ref 17–63)
ANION GAP: 6 (ref 5–15)
AST: 18 U/L (ref 15–41)
BILIRUBIN TOTAL: 0.8 mg/dL (ref 0.3–1.2)
BUN: 14 mg/dL (ref 6–20)
CALCIUM: 9 mg/dL (ref 8.9–10.3)
CO2: 25 mmol/L (ref 22–32)
Chloride: 106 mmol/L (ref 101–111)
Creatinine, Ser: 1.15 mg/dL (ref 0.61–1.24)
GFR calc Af Amer: >60 mL/min (ref >60)
GLUCOSE: 101 mg/dL — AB (ref 65–99)
Potassium: 4.2 mmol/L (ref 3.5–5.1)
Sodium: 137 mmol/L (ref 135–145)
TOTAL PROTEIN: 7.3 g/dL (ref 6.5–8.1)

## 2015-09-17 LAB — CBC WITH DIFFERENTIAL/PLATELET
BASOS ABS: 0.1 10*3/uL (ref 0–0.1)
BASOS PCT: 1 %
EOS PCT: 5 %
Eosinophils Absolute: 0.3 10*3/uL (ref 0–0.7)
HCT: 45.8 % (ref 40.0–52.0)
Hemoglobin: 14.9 g/dL (ref 13.0–18.0)
Lymphocytes Relative: 31 %
Lymphs Abs: 2 10*3/uL (ref 1.0–3.6)
MCH: 31.7 pg (ref 26.0–34.0)
MCHC: 32.5 g/dL (ref 32.0–36.0)
MCV: 97.4 fL (ref 80.0–100.0)
MONO ABS: 0.5 10*3/uL (ref 0.2–1.0)
Monocytes Relative: 8 %
Neutro Abs: 3.6 10*3/uL (ref 1.4–6.5)
Neutrophils Relative %: 55 %
PLATELETS: 230 10*3/uL (ref 150–440)
RBC: 4.7 MIL/uL (ref 4.40–5.90)
RDW: 14.4 % (ref 11.5–14.5)
WBC: 6.5 10*3/uL (ref 3.8–10.6)

## 2015-09-17 LAB — LACTATE DEHYDROGENASE: LDH: 137 U/L (ref 98–192)

## 2015-09-17 NOTE — Progress Notes (Signed)
Patient received his votrient last Wednesday.  Started it on Thursday.  States he is doing good so far.  No side effects.

## 2015-09-17 NOTE — Progress Notes (Signed)
[place] NOTE  Patient Care Team: Leone Haven, MD as PCP - General (Family Medicine)  CHIEF COMPLAINTS/PURPOSE OF CONSULTATION:   Oncology History   # JULY 2017 METASTATIC RCC [s/p LUL lung section;Dr.Oaks]; RLL- nodule ~81m  # June 2017- ? Frontal lesion on CT [cannot have MRI]  # RIGHT KIDNEY CANCER [incidental 2012; diverticulitis] pT3a (4.3x4.3x 3.2cm) [[facility] clear cell G-2; Neg margins; May 2012 ]  # July 20th- PAZOPANIB 4 pills/day  # ? Scleroderma [Almodipine; Dr.Kernodle]     Primary cancer of right kidney with metastasis from kidney to other site ([facility]   08/30/2015 Initial Diagnosis    Primary cancer of right kidney with metastasis from kidney to other site ([facility]       HISTORY OF PRESENTING ILLNESS:  Thomas [id number].o.  male a very pleasant patient newly Diagnosed metastatic renal cell carcinoma to the lung status post resection- currently on Votrient is here for follow-up.  Patient complains of tightness around the thoracotomy area.. Denies any fevers or chills. Wound healing well. No infections.   No diarrhea. No nausea no vomiting. No rash or bumps..  Patient has chronic mild shortness of breath. Otherwise denies any COPD. Denies any cough or hemoptysis.   . Denies any headaches. Denies any vision changes.  ROS: A complete 10 point review of system is done which is negative except mentioned above in history of present illness  MEDICAL HISTORY:  Past Medical History:  Diagnosis Date  . Allergic rhinitis   . Anxiety   . Arthritis   . Asthma   . [place] bronchitis ([place]   . Colitis    Had blood in his stool with this and treated in the hospital  . Colitis cystica profunda   . Colon polyps   . Complication of anesthesia    when waking up get claustophobic with mask, anxiety, panic  . Depression   . Diverticulitis   . ED (erectile dysfunction)   . Genital herpes   . Hypertension   . Kidney disease   . Medical history  non-contributory   . Occipital lymphadenopathy   . Peripheral vascular disease ([place]   . Renal cancer ([place]   . Shortness of breath dyspnea     SURGICAL HISTORY: Past Surgical History:  Procedure Laterality Date  . APPENDECTOMY  1966  . COLON SURGERY    . KIDNEY SURGERY Right 2012   Nephrectomy  . PARTIAL COLECTOMY  2012   For perforated colon related to diverticulitis- Dr. BMarina Gravel . VASECTOMY  2000  . VIDEO ASSISTED THORACOSCOPY (VATS)/THOROCOTOMY Left 08/13/2015   Procedure: LEFT THOROCOTOMY WITH LEFT UPPER LOBECTOMY, PREOP BRONCHOSCOPY;  Surgeon: TNestor Lewandowsky MD;  Location: [facility] ORS;  Service: General;  Laterality: Left;    SOCIAL HISTORY: Social History   Social History  . Marital status: Married    Spouse name: N/A  . Number of children: N/A  . Years of education: N/A   Occupational History  . Not on file.   Social History Main Topics  . Smoking status: Current Every Day Smoker    Packs/day: 0.50    Years: 30.00    Types: Cigarettes  . Smokeless tobacco: Never Used  . Alcohol use 3.0 oz/week    5 Cans of beer per week     Comment: 5 beers a night   . Drug use: No  . Sexual activity: Yes   Other Topics Concern  . Not on file   Social History  Narrative  . No narrative on file    FAMILY HISTORY: Family History  Problem Relation Age of Onset  . Arthritis/Rheumatoid      Parent  . Heart disease      Grandparent  . Alcoholism      Other relative  . Arthritis Mother     Rhematoid    ALLERGIES:  has No Known Allergies.  MEDICATIONS:  Current Outpatient Prescriptions  Medication Sig Dispense Refill  . amLODipine (NORVASC) 2.5 MG tablet Take 1 tablet by mouth daily.    . cyclobenzaprine (FLEXERIL) 10 MG tablet Take 1 tablet (10 mg total) by mouth 3 (three) times daily as needed for muscle spasms. 30 tablet 0  . gabapentin (NEURONTIN) 300 MG capsule Take 1 capsule (300 mg total) by mouth 3 (three) times daily. 42 capsule 0  . ibuprofen (ADVIL,MOTRIN)  200 MG tablet Take 1 tablet by mouth every 6 (six) hours as needed.    . loperamide (IMODIUM A-D) 2 MG tablet Take 2 mg by mouth 4 (four) times daily as needed for diarrhea or loose stools.    . meloxicam (MOBIC) 7.5 MG tablet Take 1 tablet by mouth daily.    . mupirocin nasal ointment (BACTROBAN NASAL) 2 % Place 1 application into the nose 2 (two) times daily. Use one-half of tube in each nostril twice daily for five (5) days. After application, press sides of nose together and gently massage. 10 g 0  . pazopanib (VOTRIENT) 200 MG tablet Take 4 tablets (800 mg total) by mouth daily. Take on an empty stomach. 120 tablet 4  . sildenafil (VIAGRA) 50 MG tablet Take 1 tablet (50 mg total) by mouth daily as needed for erectile dysfunction. 5 tablet 0  . tamsulosin (FLOMAX) 0.4 MG CAPS capsule Take 1 capsule (0.4 mg total) by mouth daily after breakfast. 30 capsule 0   Current Facility-Administered Medications  Medication Dose Route Frequency Provider Last Rate Last Dose  . vancomycin (VANCOCIN) 1,000 mg in sodium chloride 0.9 % 500 mL IVPB  1,000 mg Intravenous Once Nestor Lewandowsky, MD          .  PHYSICAL EXAMINATION: ECOG PERFORMANCE STATUS: 0 - Asymptomatic  Vitals:   09/17/15 1145  BP: 131/82  Pulse: 67  Resp: 18  Temp: 99.1 F (37.3 C)   Filed Weights   09/17/15 1145  Weight: 162 lb 6 oz (73.7 kg)    GENERAL: Well-nourished well-developed; Alert, no distress and comfortable. He is alone. EYES: no pallor or icterus OROPHARYNX: no thrush or ulceration; good dentition  NECK: supple, no masses felt LYMPH:  no palpable lymphadenopathy in the cervical, axillary or inguinal regions LUNGS: clear to auscultation and  No wheeze or crackles HEART/CVS: regular rate & rhythm and no murmurs; No lower extremity edema ABDOMEN: abdomen soft, non-tender and normal bowel sounds Musculoskeletal:no cyanosis of digits and no clubbing  PSYCH: alert & oriented x 3 with fluent speech NEURO: no focal  motor/sensory deficits SKIN:  no rashes or significant lesions;  LABORATORY DATA:  I have reviewed the data as listed Lab Results  Component Value Date   WBC 6.5 09/17/2015   HGB 14.9 09/17/2015   HCT 45.8 09/17/2015   MCV 97.4 09/17/2015   PLT 230 09/17/2015    Recent Labs  06/12/15 0948 08/14/15 0420 09/17/15 1125  NA 137 137 137  K 4.7 4.4 4.2  CL 103 105 106  CO2 '28 27 25  '$ GLUCOSE 91 123* 101*  BUN 17 14 14  CREATININE 1.20 1.00 1.15  CALCIUM 9.5 8.0* 9.0  GFRNONAA  --  >60 >60  GFRAA  --  >60 >60  PROT 7.1 5.6* 7.3  ALBUMIN 4.1 3.3* 4.0  AST '24 31 18  '$ ALT 14 13* 12*  ALKPHOS 56 47 73  BILITOT 0.7 0.7 0.8    RADIOGRAPHIC STUDIES: I have personally reviewed the radiological images as listed and agreed with the findings in the report. Dg Chest 2 View  Result Date: 09/04/2015 CLINICAL DATA:  Status post left upper lobectomy for left upper lobe lung cancer. EXAM: CHEST  2 VIEW COMPARISON:  08/29/2015. FINDINGS: Stable volume loss left hemi thorax consistent with prior left upper lobectomy. Anterior left pleural effusion appears stable. Air-fluid level seen towards the left apex on the previous study not evident today suggesting interval resolution of pleural gas. Right lung remains clear. No new or progressive findings. The heart size is stable. The visualized bony structures of the thorax are intact. IMPRESSION: Interval resolution of left pleural gas with persistent left anterior pleural fluid. Electronically Signed   By: Misty Stanley Thomas.D.   On: 09/04/2015 12:25   Dg Chest 2 View  Result Date: 08/29/2015 CLINICAL DATA:  Left upper lobectomy for resection of left upper lobe lung carcinoma, followup EXAM: CHEST  2 VIEW COMPARISON:  Chest x-ray of 08/24/2015 and CT chest of 07/05/2015 FINDINGS: There is a probable loculated anterior left hydro pneumothorax present with more fluid noted currently. Volume loss on the left is stable. The right lung is clear. Heart size is  stable. No bony abnormality is seen. IMPRESSION: Air-fluid level in the anterior left upper hemi thorax consistent with loculated left anterior hydro pneumothorax after left upper lobe resection. Electronically Signed   By: Ivar Drape Thomas.D.   On: 08/29/2015 10:07   Dg Chest 2 View  Result Date: 08/24/2015 CLINICAL DATA:  Left upper lobectomy. EXAM: CHEST  2 VIEW COMPARISON:  08/18/2015. FINDINGS: Surgical staples noted over the left chest. Left upper lobectomy. Interim removal of left chest tube. Interval progression of left pneumothorax. Left anterior pleural effusion resulting in left hydro pneumothorax noted. No evidence of tension. Heart size normal. Mild left chest wall subcutaneous emphysema. No acute bony abnormality. IMPRESSION: Left upper lobectomy. Interim removal of left chest tube. Left apical pneumothorax has increased from prior exam. A left anterior pleural effusion is noted resulting in the left hydro pneumothorax. No tension. These results will be called to the ordering clinician or representative by the Radiologist Assistant, and communication documented in the PACS or zVision Dashboard. Electronically Signed   By: Marcello Moores  Register   On: 08/24/2015 09:42    ASSESSMENT & PLAN:   Primary cancer of right kidney with metastasis from kidney to other site [facility]) # METASTATIC RENAL CELL CA s/p resection of LUL lung nodule; stage IV usual incurable [ right lower lobe lung nodule]. Started Pazopanib 4 pills/day July 20th 2017.   # Again reiterated the potential side effects.; None at this time point. For now.  # Scleroderma- currently stable on Norvasc; followed by rheumatology. This could be a potential issue with immunotherapy.   # Labs today within normal limits. Follow-up with me 4 weeks labs; labs in 2 weeks.     Cammie Sickle, MD 09/17/2015 6:14 PM

## 2015-09-17 NOTE — Assessment & Plan Note (Addendum)
#   METASTATIC RENAL CELL CA s/p resection of LUL lung nodule; stage IV usual incurable [ right lower lobe lung nodule]. Started Pazopanib 4 pills/day July 20th 2017.   # Again reiterated the potential side effects.; None at this time point. For now.  # Scleroderma- currently stable on Norvasc; followed by rheumatology. This could be a potential issue with immunotherapy.   # Labs today within normal limits. Follow-up with me 4 weeks labs; labs in 2 weeks.

## 2015-09-18 ENCOUNTER — Other Ambulatory Visit: Payer: Self-pay | Admitting: Cardiothoracic Surgery

## 2015-09-18 ENCOUNTER — Other Ambulatory Visit: Payer: Self-pay

## 2015-09-18 DIAGNOSIS — C3412 Malignant neoplasm of upper lobe, left bronchus or lung: Secondary | ICD-10-CM

## 2015-09-18 DIAGNOSIS — R918 Other nonspecific abnormal finding of lung field: Secondary | ICD-10-CM

## 2015-09-18 DIAGNOSIS — C641 Malignant neoplasm of right kidney, except renal pelvis: Secondary | ICD-10-CM

## 2015-09-21 ENCOUNTER — Ambulatory Visit
Admission: RE | Admit: 2015-09-21 | Discharge: 2015-09-21 | Disposition: A | Discharge status: Home / Self Care | Payer: BLUE CROSS/BLUE SHIELD | Source: Ambulatory Visit | Attending: Cardiothoracic Surgery | Admitting: Cardiothoracic Surgery

## 2015-09-21 ENCOUNTER — Encounter: Payer: Self-pay | Admitting: Cardiothoracic Surgery

## 2015-09-21 ENCOUNTER — Ambulatory Visit (INDEPENDENT_AMBULATORY_CARE_PROVIDER_SITE_OTHER): Payer: BLUE CROSS/BLUE SHIELD | Admitting: Cardiothoracic Surgery

## 2015-09-21 VITALS — BP 115/74 | HR 61 | Temp 98.3°F | Wt 166.0 lb

## 2015-09-21 DIAGNOSIS — C3412 Malignant neoplasm of upper lobe, left bronchus or lung: Secondary | ICD-10-CM | POA: Insufficient documentation

## 2015-09-21 DIAGNOSIS — Z902 Acquired absence of lung [part of]: Secondary | ICD-10-CM | POA: Diagnosis not present

## 2015-09-21 DIAGNOSIS — I700 Atherosclerosis of aorta: Secondary | ICD-10-CM | POA: Diagnosis not present

## 2015-09-21 DIAGNOSIS — R918 Other nonspecific abnormal finding of lung field: Secondary | ICD-10-CM

## 2015-09-21 NOTE — Progress Notes (Signed)
[name] Post-Op Note  Patient ID: Thomas Le, male   DOB: 01/01/1956, 60 y.o.   MRN: [id number]  HISTORY: He returns today in follow-up. He's begun therapy for his metastatic renal cell carcinoma. He has only minimal discomfort at this point in time. He has minimal shortness of breath.   Vitals:   09/21/15 0915  BP: 115/74  Pulse: 61  Temp: 98.3 F (36.8 C)     EXAM: Resp: Lungs are clear bilaterally.  No respiratory distress, normal effort. Heart:  Regular without murmurs Neurological: Alert and oriented to person, place, and time. Coordination normal.  Skin: Skin is warm and dry. No rash noted. No diaphoretic. No erythema. No pallor.  Psychiatric: Normal mood and affect. Normal behavior. Judgment and thought content normal.    ASSESSMENT: Overall he continues to improve. His thoracotomy wound is well healed. He is aware that he has a nodule in the right lung that will require follow-up. He is scheduled to follow-up with Dr. Fabienne Bruns   PLAN:   We will follow-up with him as needed.    Nestor Lewandowsky, MD

## 2015-09-25 ENCOUNTER — Ambulatory Visit: Payer: Self-pay | Admitting: Cardiothoracic Surgery

## 2015-10-01 ENCOUNTER — Inpatient Hospital Stay: Payer: BLUE CROSS/BLUE SHIELD | Attending: Internal Medicine

## 2015-10-01 DIAGNOSIS — F17210 Nicotine dependence, cigarettes, uncomplicated: Secondary | ICD-10-CM | POA: Diagnosis not present

## 2015-10-01 DIAGNOSIS — Z905 Acquired absence of kidney: Secondary | ICD-10-CM | POA: Diagnosis not present

## 2015-10-01 DIAGNOSIS — C7802 Secondary malignant neoplasm of left lung: Secondary | ICD-10-CM | POA: Diagnosis not present

## 2015-10-01 DIAGNOSIS — Z86010 Personal history of colonic polyps: Secondary | ICD-10-CM | POA: Diagnosis not present

## 2015-10-01 DIAGNOSIS — C641 Malignant neoplasm of right kidney, except renal pelvis: Secondary | ICD-10-CM

## 2015-10-01 DIAGNOSIS — I739 Peripheral vascular disease, unspecified: Secondary | ICD-10-CM | POA: Diagnosis not present

## 2015-10-01 DIAGNOSIS — M1990 Unspecified osteoarthritis, unspecified site: Secondary | ICD-10-CM | POA: Diagnosis not present

## 2015-10-01 DIAGNOSIS — Z79899 Other long term (current) drug therapy: Secondary | ICD-10-CM | POA: Insufficient documentation

## 2015-10-01 DIAGNOSIS — L271 Localized skin eruption due to drugs and medicaments taken internally: Secondary | ICD-10-CM | POA: Diagnosis not present

## 2015-10-01 DIAGNOSIS — J449 Chronic obstructive pulmonary disease, unspecified: Secondary | ICD-10-CM | POA: Insufficient documentation

## 2015-10-01 DIAGNOSIS — R5383 Other fatigue: Secondary | ICD-10-CM | POA: Insufficient documentation

## 2015-10-01 DIAGNOSIS — F418 Other specified anxiety disorders: Secondary | ICD-10-CM | POA: Diagnosis not present

## 2015-10-01 DIAGNOSIS — Z85528 Personal history of other malignant neoplasm of kidney: Secondary | ICD-10-CM | POA: Insufficient documentation

## 2015-10-01 DIAGNOSIS — M349 Systemic sclerosis, unspecified: Secondary | ICD-10-CM | POA: Diagnosis not present

## 2015-10-01 DIAGNOSIS — R5381 Other malaise: Secondary | ICD-10-CM | POA: Diagnosis not present

## 2015-10-01 DIAGNOSIS — L0292 Furuncle, unspecified: Secondary | ICD-10-CM | POA: Insufficient documentation

## 2015-10-01 DIAGNOSIS — R911 Solitary pulmonary nodule: Secondary | ICD-10-CM | POA: Insufficient documentation

## 2015-10-01 DIAGNOSIS — I700 Atherosclerosis of aorta: Secondary | ICD-10-CM | POA: Insufficient documentation

## 2015-10-01 DIAGNOSIS — R7989 Other specified abnormal findings of blood chemistry: Secondary | ICD-10-CM | POA: Insufficient documentation

## 2015-10-01 LAB — COMPREHENSIVE METABOLIC PANEL
ALT: 14 U/L — ABNORMAL LOW (ref 17–63)
ANION GAP: 6 (ref 5–15)
AST: 20 U/L (ref 15–41)
Albumin: 4.1 g/dL (ref 3.5–5.0)
Alkaline Phosphatase: 88 U/L (ref 38–126)
BUN: 15 mg/dL (ref 6–20)
CALCIUM: 8.6 mg/dL — AB (ref 8.9–10.3)
CHLORIDE: 103 mmol/L (ref 101–111)
CO2: 27 mmol/L (ref 22–32)
Creatinine, Ser: 1.06 mg/dL (ref 0.61–1.24)
Glucose, Bld: 118 mg/dL — ABNORMAL HIGH (ref 65–99)
Potassium: 4.5 mmol/L (ref 3.5–5.1)
SODIUM: 136 mmol/L (ref 135–145)
Total Bilirubin: 0.8 mg/dL (ref 0.3–1.2)
Total Protein: 7.1 g/dL (ref 6.5–8.1)

## 2015-10-01 LAB — CBC WITH DIFFERENTIAL/PLATELET
Basophils Absolute: 0.1 10*3/uL (ref 0–0.1)
Basophils Relative: 1 %
EOS ABS: 0.3 10*3/uL (ref 0–0.7)
EOS PCT: 5 %
HCT: 47.1 % (ref 40.0–52.0)
Hemoglobin: 16 g/dL (ref 13.0–18.0)
LYMPHS ABS: 1.7 10*3/uL (ref 1.0–3.6)
Lymphocytes Relative: 28 %
MCH: 33.3 pg (ref 26.0–34.0)
MCHC: 34 g/dL (ref 32.0–36.0)
MCV: 97.7 fL (ref 80.0–100.0)
MONO ABS: 0.6 10*3/uL (ref 0.2–1.0)
MONOS PCT: 9 %
Neutro Abs: 3.5 10*3/uL (ref 1.4–6.5)
Neutrophils Relative %: 57 %
PLATELETS: 301 10*3/uL (ref 150–440)
RBC: 4.82 MIL/uL (ref 4.40–5.90)
RDW: 15.1 % — ABNORMAL HIGH (ref 11.5–14.5)
WBC: 6.1 10*3/uL (ref 3.8–10.6)

## 2015-10-15 ENCOUNTER — Inpatient Hospital Stay (HOSPITAL_BASED_OUTPATIENT_CLINIC_OR_DEPARTMENT_OTHER): Payer: BLUE CROSS/BLUE SHIELD | Admitting: Internal Medicine

## 2015-10-15 ENCOUNTER — Inpatient Hospital Stay: Payer: BLUE CROSS/BLUE SHIELD

## 2015-10-15 ENCOUNTER — Encounter: Payer: Self-pay | Admitting: Internal Medicine

## 2015-10-15 VITALS — BP 109/75 | HR 69 | Temp 96.4°F | Resp 16 | Ht 73.0 in | Wt 161.8 lb

## 2015-10-15 DIAGNOSIS — Z79899 Other long term (current) drug therapy: Secondary | ICD-10-CM

## 2015-10-15 DIAGNOSIS — L0292 Furuncle, unspecified: Secondary | ICD-10-CM

## 2015-10-15 DIAGNOSIS — C641 Malignant neoplasm of right kidney, except renal pelvis: Secondary | ICD-10-CM

## 2015-10-15 DIAGNOSIS — R945 Abnormal results of liver function studies: Secondary | ICD-10-CM

## 2015-10-15 DIAGNOSIS — R911 Solitary pulmonary nodule: Secondary | ICD-10-CM | POA: Diagnosis not present

## 2015-10-15 DIAGNOSIS — Z905 Acquired absence of kidney: Secondary | ICD-10-CM

## 2015-10-15 DIAGNOSIS — C7802 Secondary malignant neoplasm of left lung: Secondary | ICD-10-CM | POA: Diagnosis not present

## 2015-10-15 DIAGNOSIS — R5383 Other fatigue: Secondary | ICD-10-CM

## 2015-10-15 DIAGNOSIS — Z85528 Personal history of other malignant neoplasm of kidney: Secondary | ICD-10-CM

## 2015-10-15 DIAGNOSIS — Z86010 Personal history of colonic polyps: Secondary | ICD-10-CM

## 2015-10-15 DIAGNOSIS — I700 Atherosclerosis of aorta: Secondary | ICD-10-CM

## 2015-10-15 DIAGNOSIS — I739 Peripheral vascular disease, unspecified: Secondary | ICD-10-CM

## 2015-10-15 DIAGNOSIS — M349 Systemic sclerosis, unspecified: Secondary | ICD-10-CM

## 2015-10-15 DIAGNOSIS — M1990 Unspecified osteoarthritis, unspecified site: Secondary | ICD-10-CM

## 2015-10-15 DIAGNOSIS — J449 Chronic obstructive pulmonary disease, unspecified: Secondary | ICD-10-CM

## 2015-10-15 DIAGNOSIS — R7989 Other specified abnormal findings of blood chemistry: Secondary | ICD-10-CM

## 2015-10-15 DIAGNOSIS — F17210 Nicotine dependence, cigarettes, uncomplicated: Secondary | ICD-10-CM

## 2015-10-15 DIAGNOSIS — F419 Anxiety disorder, unspecified: Secondary | ICD-10-CM

## 2015-10-15 DIAGNOSIS — L271 Localized skin eruption due to drugs and medicaments taken internally: Secondary | ICD-10-CM

## 2015-10-15 DIAGNOSIS — R5381 Other malaise: Secondary | ICD-10-CM

## 2015-10-15 LAB — COMPREHENSIVE METABOLIC PANEL
ALBUMIN: 3.8 g/dL (ref 3.5–5.0)
ALT: 401 U/L — AB (ref 17–63)
AST: 270 U/L — AB (ref 15–41)
Alkaline Phosphatase: 72 U/L (ref 38–126)
Anion gap: 9 (ref 5–15)
BUN: 16 mg/dL (ref 6–20)
CHLORIDE: 103 mmol/L (ref 101–111)
CO2: 23 mmol/L (ref 22–32)
CREATININE: 1.03 mg/dL (ref 0.61–1.24)
Calcium: 8.8 mg/dL — ABNORMAL LOW (ref 8.9–10.3)
GFR calc Af Amer: >60 mL/min (ref >60)
GFR calc non Af Amer: >60 mL/min (ref >60)
GLUCOSE: 165 mg/dL — AB (ref 65–99)
POTASSIUM: 4.3 mmol/L (ref 3.5–5.1)
SODIUM: 135 mmol/L (ref 135–145)
TOTAL PROTEIN: 7.2 g/dL (ref 6.5–8.1)
Total Bilirubin: 0.9 mg/dL (ref 0.3–1.2)

## 2015-10-15 LAB — CBC WITH DIFFERENTIAL/PLATELET
BASOS ABS: 0.1 10*3/uL (ref 0–0.1)
BASOS PCT: 2 %
EOS PCT: 6 %
Eosinophils Absolute: 0.4 10*3/uL (ref 0–0.7)
HCT: 48.3 % (ref 40.0–52.0)
Hemoglobin: 16.4 g/dL (ref 13.0–18.0)
LYMPHS PCT: 28 %
Lymphs Abs: 1.7 10*3/uL (ref 1.0–3.6)
MCH: 33.2 pg (ref 26.0–34.0)
MCHC: 33.9 g/dL (ref 32.0–36.0)
MCV: 98 fL (ref 80.0–100.0)
MONO ABS: 0.6 10*3/uL (ref 0.2–1.0)
Monocytes Relative: 10 %
Neutro Abs: 3.4 10*3/uL (ref 1.4–6.5)
Neutrophils Relative %: 54 %
PLATELETS: 207 10*3/uL (ref 150–440)
RBC: 4.93 MIL/uL (ref 4.40–5.90)
RDW: 15.8 % — AB (ref 11.5–14.5)
WBC: 6.2 10*3/uL (ref 3.8–10.6)

## 2015-10-15 MED ORDER — AMOXICILLIN-POT CLAVULANATE 875-125 MG PO TABS: 1.0000 | ORAL_TABLET | Freq: Two times a day (BID) | ORAL | 0 refills | Status: DC

## 2015-10-15 NOTE — Progress Notes (Signed)
Pt would like to talk about Votriant.  Pt reports sore on hands and feet, Dizzy, Headaches.  Pt reports a boil on or around anal area that is causing severe pain.  Reports hx of MRSA.  Reports extreme fatigue.

## 2015-10-15 NOTE — Assessment & Plan Note (Signed)
#   METASTATIC RENAL CELL CA s/p resection of LUL lung nodule; stage IV usual incurable [ right lower lobe lung nodule]. Started Pazopanib 4 pills/day July 20th 2017.   # Fatigue/ elevated LFTs- AST/ALT x 5 times. Sec to Pazopanib- STOP Pazopanib For now.    # Hand foot syndrome- G-2 ; vaseline-   # Boil in anal region/ per--rectal abscess start Augmentin; wants to hold off surgery eval.   # Scleroderma- currently stable on Norvasc; followed by rheumatology.   # Follow up in 2 weeks/ re-check LFts in 1 week.

## 2015-10-15 NOTE — Progress Notes (Signed)
[place] NOTE  Patient Care Team: Leone Haven, MD as PCP - General (Family Medicine)  CHIEF COMPLAINTS/PURPOSE OF CONSULTATION:   Oncology History   # JULY 2017 METASTATIC RCC [s/p LUL lung section;Dr.Oaks]; RLL- nodule ~61m  # June 2017- ? Frontal lesion on CT [cannot have MRI]  # RIGHT KIDNEY CANCER [incidental 2012; diverticulitis] pT3a (4.3x4.3x 3.2cm) [[facility] clear cell G-2; Neg margins; May 2012 ]  # July 20th- PAZOPANIB 4 pills/day  # ? Scleroderma [Almodipine; Dr.Kernodle]     Primary cancer of right kidney with metastasis from kidney to other site ([facility]   08/30/2015 Initial Diagnosis    Primary cancer of right kidney with metastasis from kidney to other site ([facility]        HISTORY OF PRESENTING ILLNESS:  [name]89y.o.  male a very pleasant patient newly Diagnosed metastatic renal cell carcinoma to the lung status post resection- currently on Votrient is here for follow-up/ he has been on Votrient for approximately 1 month.  He feels extremely poorly. He complains of extreme fatigue. Complains of pain in hands and feet. Noted to have a calluses on his feet. Painful.  Denies any abdominal pain or vomiting.   He complains of pain in his rectal area. Started approximately 5 days ago. Just this morning popped a "boil"- this morning.   Denies any diarrhea. Denies any headaches. Denies any vision changes.  ROS: A complete 10 point review of system is done which is negative except mentioned above in history of present illness  MEDICAL HISTORY:  Past Medical History:  Diagnosis Date  . Allergic rhinitis   . Anxiety   . Arthritis   . Asthma   . [place] ([place]   . Colitis    Had blood in his stool with this and treated in the hospital  . Colitis cystica profunda   . Colon polyps   . Complication of anesthesia    when waking up get claustophobic with mask, anxiety, panic  . Depression   . Diverticulitis   . ED  (erectile dysfunction)   . Genital herpes   . Hypertension   . Kidney disease   . Medical history non-contributory   . Occipital lymphadenopathy   . Peripheral vascular disease ([place]   . [place]al cancer ([place]   . Shortness of breath dyspnea     SURGICAL HISTORY: Past Surgical History:  Procedure Laterality Date  . APPENDECTOMY  1966  . COLON SURGERY    . KIDNEY SURGERY Right 2012   Nephrectomy  . PARTIAL COLECTOMY  2012   For perforated colon related to diverticulitis- Dr. BMarina Gravel . VASECTOMY  2000  . VIDEO ASSISTED THORACOSCOPY (VATS)/THOROCOTOMY Left 08/13/2015   Procedure: LEFT THOROCOTOMY WITH LEFT UPPER LOBECTOMY, PREOP BRONCHOSCOPY;  Surgeon: TNestor Lewandowsky MD;  Location: [facility] ORS;  Service: General;  Laterality: Left;    SOCIAL HISTORY: Social History   Social History  . Marital status: Married    Spouse name: N/A  . Number of children: N/A  . Years of education: N/A   Occupational History  . Not on file.   Social History Main Topics  . Smoking status: Current Every Day Smoker    Packs/day: 0.25    Years: 30.00    Types: Cigarettes  . Smokeless tobacco: Never Used  . Alcohol use 3.0 oz/week    5 Cans of beer per week     Comment: 5 beers a night   . Drug use:  No  . Sexual activity: Yes   Other Topics Concern  . Not on file   Social History Narrative  . No narrative on file    FAMILY HISTORY: Family History  Problem Relation Age of Onset  . Arthritis/Rheumatoid      Parent  . Heart disease      Grandparent  . Alcoholism      Other relative  . Arthritis Mother     Rhematoid    ALLERGIES:  has No Known Allergies.  MEDICATIONS:  Current Outpatient Prescriptions  Medication Sig Dispense Refill  . amLODipine (NORVASC) 2.5 MG tablet Take 1 tablet by mouth daily.    Marland Kitchen ibuprofen (ADVIL,MOTRIN) 200 MG tablet Take 1 tablet by mouth every 6 (six) hours as needed.    . loperamide (IMODIUM A-D) 2 MG tablet Take 2 mg by mouth 4 (four) times daily as needed  for diarrhea or loose stools.    . meloxicam (MOBIC) 7.5 MG tablet Take 1 tablet by mouth daily.    . mupirocin nasal ointment (BACTROBAN NASAL) 2 % Place 1 application into the nose 2 (two) times daily. Use one-half of tube in each nostril twice daily for five (5) days. After application, press sides of nose together and gently massage. 10 g 0  . pazopanib (VOTRIENT) 200 MG tablet Take 4 tablets (800 mg total) by mouth daily. Take on an empty stomach. 120 tablet 4  . amoxicillin-clavulanate (AUGMENTIN) 875-125 MG tablet Take 1 tablet by mouth 2 (two) times daily. 20 tablet 0  . gabapentin (NEURONTIN) 300 MG capsule Take 1 capsule (300 mg total) by mouth 3 (three) times daily. (Patient not taking: Reported on 10/15/2015) 42 capsule 0  . sildenafil (VIAGRA) 50 MG tablet Take 1 tablet (50 mg total) by mouth daily as needed for erectile dysfunction. (Patient not taking: Reported on 10/15/2015) 5 tablet 0  . tamsulosin (FLOMAX) 0.4 MG CAPS capsule Take 1 capsule (0.4 mg total) by mouth daily after breakfast. (Patient not taking: Reported on 10/15/2015) 30 capsule 0   Current Facility-Administered Medications  Medication Dose Route Frequency Provider Last Rate Last Dose  . vancomycin (VANCOCIN) 1,000 mg in sodium chloride 0.9 % 500 mL IVPB  1,000 mg Intravenous Once Nestor Lewandowsky, MD          .  PHYSICAL EXAMINATION: ECOG PERFORMANCE STATUS: 0 - Asymptomatic  Vitals:   10/15/15 1120 10/15/15 1128  BP: 121/83 109/75  Pulse: 65 69  Resp: 16   Temp: (!) 96.4 F (35.8 C)    Filed Weights   10/15/15 1120  Weight: 161 lb 12.8 oz (73.4 kg)    GENERAL: Thin built moderately nourished lert, no distress and comfortable. He is alone. He feels poorly. EYES: no pallor or icterus OROPHARYNX: no thrush or ulceration; good dentition  NECK: supple, no masses felt LYMPH:  no palpable lymphadenopathy in the cervical, axillary or inguinal regions LUNGS: clear to auscultation and  No wheeze or  crackles HEART/CVS: regular rate & rhythm and no murmurs; No lower extremity edema ABDOMEN: abdomen soft, non-tender and normal bowel sounds Musculoskeletal:no cyanosis of digits and no clubbing  PSYCH: alert & oriented x 3 with fluent speech NEURO: no focal motor/sensory deficits SKIN:  Anal exam- 8:00-fullness/abscess noted. Not draining.  LABORATORY DATA:  I have reviewed the data as listed Lab Results  Component Value Date   WBC 6.2 10/15/2015   HGB 16.4 10/15/2015   HCT 48.3 10/15/2015   MCV 98.0 10/15/2015   PLT 207  10/15/2015    Recent Labs  09/17/15 1125 10/01/15 1055 10/15/15 1047  NA 137 136 135  K 4.2 4.5 4.3  CL 106 103 103  CO2 '25 27 23  '$ GLUCOSE 101* 118* 165*  BUN '14 15 16  '$ CREATININE 1.15 1.06 1.03  CALCIUM 9.0 8.6* 8.8*  GFRNONAA >60 >60 >60  GFRAA >60 >60 >60  PROT 7.3 7.1 7.2  ALBUMIN 4.0 4.1 3.8  AST 18 20 270*  ALT 12* 14* 401*  ALKPHOS 73 88 72  BILITOT 0.8 0.8 0.9    RADIOGRAPHIC STUDIES: I have personally reviewed the radiological images as listed and agreed with the findings in the report. Dg Chest 2 View  Result Date: 09/21/2015 CLINICAL DATA:  Status post effort left upper lobectomy 1 month ago for malignancy, follow-up study, no current complaints. EXAM: CHEST  2 VIEW COMPARISON:  PA and lateral chest x-ray of September 04, 2015 FINDINGS: There remains mild volume loss on the left. There is no significant pleural effusion. There is no pneumothorax. No pulmonary parenchymal mass is observed. There is stable mild leftward shift of the mediastinum with compensatory hyperinflation of the right lung. The heart and pulmonary vascularity are normal. The observed bony thorax exhibits no acute abnormality. There is calcification in the wall of the thoracic aorta. IMPRESSION: Stable post left upper lobectomy changes. No evidence of recurrent malignancy nor significant pleural effusion. Aortic atherosclerosis. Electronically Signed   By: David  Martinique M.D.    On: 09/21/2015 08:47   ASSESSMENT & PLAN:   Primary cancer of right kidney with metastasis from kidney to other site [facility]) # METASTATIC RENAL CELL CA s/p resection of LUL lung nodule; stage IV usual incurable [ right lower lobe lung nodule]. Started Pazopanib 4 pills/day July 20th 2017.   # Fatigue/ elevated LFTs- AST/ALT x 5 times. Sec to Pazopanib- STOP Pazopanib For now.    # Hand foot syndrome- G-2 ; vaseline-   # Boil in anal region/ per--rectal abscess start Augmentin; wants to hold off surgery eval.   # Scleroderma- currently stable on Norvasc; followed by rheumatology.   # Follow up in 2 weeks/ re-check LFts in 1 week.      Cammie Sickle, MD 10/15/2015 1:45 PM

## 2015-10-22 ENCOUNTER — Inpatient Hospital Stay: Payer: BLUE CROSS/BLUE SHIELD

## 2015-10-22 DIAGNOSIS — R7989 Other specified abnormal findings of blood chemistry: Secondary | ICD-10-CM

## 2015-10-22 DIAGNOSIS — R945 Abnormal results of liver function studies: Secondary | ICD-10-CM

## 2015-10-22 DIAGNOSIS — C641 Malignant neoplasm of right kidney, except renal pelvis: Secondary | ICD-10-CM

## 2015-10-22 DIAGNOSIS — C7802 Secondary malignant neoplasm of left lung: Secondary | ICD-10-CM | POA: Diagnosis not present

## 2015-10-22 LAB — HEPATIC FUNCTION PANEL
ALBUMIN: 3.6 g/dL (ref 3.5–5.0)
ALT: 185 U/L — ABNORMAL HIGH (ref 17–63)
AST: 110 U/L — ABNORMAL HIGH (ref 15–41)
Alkaline Phosphatase: 70 U/L (ref 38–126)
Bilirubin, Direct: <0.1 mg/dL — ABNORMAL LOW (ref 0.1–0.5)
TOTAL PROTEIN: 6.6 g/dL (ref 6.5–8.1)
Total Bilirubin: 0.9 mg/dL (ref 0.3–1.2)

## 2015-10-23 ENCOUNTER — Telehealth: Payer: Self-pay | Admitting: *Deleted

## 2015-10-23 NOTE — Telephone Encounter (Signed)
-----   Message from Cammie Sickle, MD sent at 10/23/2015  8:43 AM EDT ----- Please inform patient that labs significantly improved; but not back to baseline. Continue to hold Votrient. Labs/follow-up as planned.

## 2015-10-23 NOTE — Telephone Encounter (Signed)
Called patient to inform him that labs have significantly improved but not quite back to baseline.  He is to continue to hold Votrient and follow up as planned.

## 2015-11-01 ENCOUNTER — Inpatient Hospital Stay: Payer: BLUE CROSS/BLUE SHIELD | Attending: Internal Medicine

## 2015-11-01 ENCOUNTER — Encounter: Payer: Self-pay | Admitting: Internal Medicine

## 2015-11-01 ENCOUNTER — Inpatient Hospital Stay (HOSPITAL_BASED_OUTPATIENT_CLINIC_OR_DEPARTMENT_OTHER): Payer: BLUE CROSS/BLUE SHIELD | Admitting: Internal Medicine

## 2015-11-01 ENCOUNTER — Other Ambulatory Visit: Payer: Self-pay

## 2015-11-01 VITALS — BP 132/84 | HR 100 | Temp 96.1°F | Resp 19 | Ht 73.0 in | Wt 170.2 lb

## 2015-11-01 DIAGNOSIS — R7989 Other specified abnormal findings of blood chemistry: Secondary | ICD-10-CM

## 2015-11-01 DIAGNOSIS — R5383 Other fatigue: Secondary | ICD-10-CM

## 2015-11-01 DIAGNOSIS — J449 Chronic obstructive pulmonary disease, unspecified: Secondary | ICD-10-CM

## 2015-11-01 DIAGNOSIS — Z85528 Personal history of other malignant neoplasm of kidney: Secondary | ICD-10-CM | POA: Diagnosis not present

## 2015-11-01 DIAGNOSIS — T451X5S Adverse effect of antineoplastic and immunosuppressive drugs, sequela: Secondary | ICD-10-CM | POA: Diagnosis not present

## 2015-11-01 DIAGNOSIS — Z86010 Personal history of colonic polyps: Secondary | ICD-10-CM

## 2015-11-01 DIAGNOSIS — M1990 Unspecified osteoarthritis, unspecified site: Secondary | ICD-10-CM

## 2015-11-01 DIAGNOSIS — K611 Rectal abscess: Secondary | ICD-10-CM

## 2015-11-01 DIAGNOSIS — F17210 Nicotine dependence, cigarettes, uncomplicated: Secondary | ICD-10-CM

## 2015-11-01 DIAGNOSIS — C641 Malignant neoplasm of right kidney, except renal pelvis: Secondary | ICD-10-CM

## 2015-11-01 DIAGNOSIS — I10 Essential (primary) hypertension: Secondary | ICD-10-CM | POA: Insufficient documentation

## 2015-11-01 DIAGNOSIS — Z905 Acquired absence of kidney: Secondary | ICD-10-CM | POA: Diagnosis not present

## 2015-11-01 DIAGNOSIS — R945 Abnormal results of liver function studies: Secondary | ICD-10-CM

## 2015-11-01 DIAGNOSIS — I739 Peripheral vascular disease, unspecified: Secondary | ICD-10-CM | POA: Insufficient documentation

## 2015-11-01 DIAGNOSIS — R911 Solitary pulmonary nodule: Secondary | ICD-10-CM | POA: Insufficient documentation

## 2015-11-01 DIAGNOSIS — L271 Localized skin eruption due to drugs and medicaments taken internally: Secondary | ICD-10-CM | POA: Diagnosis not present

## 2015-11-01 DIAGNOSIS — Z79899 Other long term (current) drug therapy: Secondary | ICD-10-CM

## 2015-11-01 DIAGNOSIS — C7802 Secondary malignant neoplasm of left lung: Secondary | ICD-10-CM

## 2015-11-01 LAB — CBC WITH DIFFERENTIAL/PLATELET
BASOS PCT: 1 %
Basophils Absolute: 0.1 10*3/uL (ref 0–0.1)
EOS ABS: 0.3 10*3/uL (ref 0–0.7)
Eosinophils Relative: 4 %
HCT: 42.8 % (ref 40.0–52.0)
Hemoglobin: 14.4 g/dL (ref 13.0–18.0)
Lymphocytes Relative: 29 %
Lymphs Abs: 1.9 10*3/uL (ref 1.0–3.6)
MCH: 33.5 pg (ref 26.0–34.0)
MCHC: 33.6 g/dL (ref 32.0–36.0)
MCV: 99.5 fL (ref 80.0–100.0)
MONO ABS: 0.8 10*3/uL (ref 0.2–1.0)
MONOS PCT: 12 %
Neutro Abs: 3.7 10*3/uL (ref 1.4–6.5)
Neutrophils Relative %: 54 %
PLATELETS: 213 10*3/uL (ref 150–440)
RBC: 4.3 MIL/uL — ABNORMAL LOW (ref 4.40–5.90)
RDW: 17 % — ABNORMAL HIGH (ref 11.5–14.5)
WBC: 6.8 10*3/uL (ref 3.8–10.6)

## 2015-11-01 LAB — COMPREHENSIVE METABOLIC PANEL
ALBUMIN: 3.7 g/dL (ref 3.5–5.0)
ALK PHOS: 68 U/L (ref 38–126)
ALT: 47 U/L (ref 17–63)
AST: 40 U/L (ref 15–41)
Anion gap: 5 (ref 5–15)
BUN: 12 mg/dL (ref 6–20)
CALCIUM: 8.4 mg/dL — AB (ref 8.9–10.3)
CO2: 26 mmol/L (ref 22–32)
CREATININE: 0.98 mg/dL (ref 0.61–1.24)
Chloride: 105 mmol/L (ref 101–111)
GFR calc Af Amer: >60 mL/min (ref >60)
GFR calc non Af Amer: >60 mL/min (ref >60)
GLUCOSE: 103 mg/dL — AB (ref 65–99)
Potassium: 4 mmol/L (ref 3.5–5.1)
SODIUM: 136 mmol/L (ref 135–145)
Total Bilirubin: 0.9 mg/dL (ref 0.3–1.2)
Total Protein: 6.7 g/dL (ref 6.5–8.1)

## 2015-11-01 NOTE — Progress Notes (Signed)
Pt reports he is feeling much better since last visit with no concerns.

## 2015-11-01 NOTE — Assessment & Plan Note (Addendum)
#   METASTATIC RENAL CELL CA s/p resection of LUL lung nodule; stage IV usual incurable [ right lower lobe lung nodule]. Pazopanib 4 pills/day July 20th 2017- STOPPED sec to LFTs elevation.  # LFT today within normal limits; lipase amylase normal. Re-start Pazopanib at 2 pills once a day.   # Hand foot syndrome- G-2 ; improved.   # Boil in anal region/ per--rectal abscess start Augmentin- improved.   # Scleroderma- currently stable on Norvasc; followed by rheumatology.   # Mucositis- discussed re: salt/baking soda rinses.   # weekly labs; follow up with MD in 4 weeks.

## 2015-11-01 NOTE — Patient Instructions (Addendum)
Start taking Votrient 2 tablets daily  (400 mg total) today.  Take on empty stomach

## 2015-11-01 NOTE — Progress Notes (Signed)
[place] NOTE  Patient Care Team: Leone Haven, MD as PCP - General (Family Medicine)  CHIEF COMPLAINTS/PURPOSE OF CONSULTATION:   Oncology History   # JULY 2017 METASTATIC RCC [s/p LUL lung section;Dr.Oaks]; RLL- nodule ~61m  # June 2017- ? Frontal lesion on CT [cannot have MRI]  # RIGHT KIDNEY CANCER [incidental 2012; diverticulitis] pT3a (4.3x4.3x 3.2cm) [[facility] clear cell G-2; Neg margins; May 2012 ]  # July 20th- PAZOPANIB 4 pills/day  # ? Scleroderma [Almodipine; Dr.Kernodle]     Primary cancer of right kidney with metastasis from kidney to other site ([facility]   08/30/2015 Initial Diagnosis    Primary cancer of right kidney with metastasis from kidney to other site ([facility]        HISTORY OF PRESENTING ILLNESS:  [name]89y.o.  male a very pleasant patient newly Diagnosed metastatic renal cell carcinoma to the lung status post resection- currently on Votrient is here for follow-up/ he has been on Votrient for approximately 1 month. Votrient was held at last visit 2 weeks ago because of elevated LFTs.  Since Votrient been held for the last 2 weeks- patient feels significantly improved. His appetite is back. Fatigue is improving. Pain in his hand and feet is improving. He was also an antibiotic for a perirectal abscess- significantly improved. No fevers. No drainage.  ROS: A complete 10 point review of system is done which is negative except mentioned above in history of present illness  MEDICAL HISTORY:  Past Medical History:  Diagnosis Date  . Allergic rhinitis   . Anxiety   . Arthritis   . Asthma   . Chronic bronchitis ([place]   . Colitis    Had blood in his stool with this and treated in the hospital  . Colitis cystica profunda   . Colon polyps   . Complication of anesthesia    when waking up get claustophobic with mask, anxiety, panic  . Depression   . Diverticulitis   . ED (erectile dysfunction)   . Genital herpes   .  Hypertension   . Kidney disease   . Medical history non-contributory   . Occipital lymphadenopathy   . Peripheral vascular disease ([place]   . Renal cancer ([place]   . Shortness of breath dyspnea     SURGICAL HISTORY: Past Surgical History:  Procedure Laterality Date  . APPENDECTOMY  1966  . COLON SURGERY    . KIDNEY SURGERY Right 2012   Nephrectomy  . PARTIAL COLECTOMY  2012   For perforated colon related to diverticulitis- Dr. BMarina Gravel . VASECTOMY  2000  . VIDEO ASSISTED THORACOSCOPY (VATS)/THOROCOTOMY Left 08/13/2015   Procedure: LEFT THOROCOTOMY WITH LEFT UPPER LOBECTOMY, PREOP BRONCHOSCOPY;  Surgeon: TNestor Lewandowsky MD;  Location: [facility] ORS;  Service: General;  Laterality: Left;    SOCIAL HISTORY: Social History   Social History  . Marital status: Married    Spouse name: N/A  . Number of children: N/A  . Years of education: N/A   Occupational History  . Not on file.   Social History Main Topics  . Smoking status: Current Every Day Smoker    Packs/day: 0.25    Years: 30.00    Types: Cigarettes  . Smokeless tobacco: Never Used  . Alcohol use 3.0 oz/week    5 Cans of beer per week     Comment: 5 beers a night   . Drug use: No  . Sexual activity: Yes   Other Topics Concern  .  Not on file   Social History Narrative  . No narrative on file    FAMILY HISTORY: Family History  Problem Relation Age of Onset  . Arthritis/Rheumatoid      Parent  . Heart disease      Grandparent  . Alcoholism      Other relative  . Arthritis Mother     Rhematoid    ALLERGIES:  has No Known Allergies.  MEDICATIONS:  Current Outpatient Prescriptions  Medication Sig Dispense Refill  . amLODipine (NORVASC) 2.5 MG tablet Take 1 tablet by mouth daily.    Marland Kitchen gabapentin (NEURONTIN) 300 MG capsule Take 1 capsule (300 mg total) by mouth 3 (three) times daily. 42 capsule 0  . loperamide (IMODIUM A-D) 2 MG tablet Take 2 mg by mouth 4 (four) times daily as needed for diarrhea or loose stools.     . meloxicam (MOBIC) 7.5 MG tablet Take 1 tablet by mouth daily.    . mupirocin nasal ointment (BACTROBAN NASAL) 2 % Place 1 application into the nose 2 (two) times daily. Use one-half of tube in each nostril twice daily for five (5) days. After application, press sides of nose together and gently massage. 10 g 0  . sildenafil (VIAGRA) 50 MG tablet Take 1 tablet (50 mg total) by mouth daily as needed for erectile dysfunction. 5 tablet 0  . ibuprofen (ADVIL,MOTRIN) 200 MG tablet Take 1 tablet by mouth every 6 (six) hours as needed.    . pazopanib (VOTRIENT) 200 MG tablet Take 4 tablets (800 mg total) by mouth daily. Take on an empty stomach. (Patient not taking: Reported on 11/01/2015) 120 tablet 4   Current Facility-Administered Medications  Medication Dose Route Frequency Provider Last Rate Last Dose  . vancomycin (VANCOCIN) 1,000 mg in sodium chloride 0.9 % 500 mL IVPB  1,000 mg Intravenous Once Nestor Lewandowsky, MD          .  PHYSICAL EXAMINATION: ECOG PERFORMANCE STATUS: 0 - Asymptomatic  Vitals:   11/01/15 1046  BP: 132/84  Pulse: 100  Resp: 19  Temp: (!) 96.1 F (35.6 C)   Filed Weights   11/01/15 1046  Weight: 170 lb 3.2 oz (77.2 kg)    GENERAL: Thin built moderately nourished lert, no distress and comfortable. He is alone. He feels significantly improved since last visit. EYES: no pallor or icterus OROPHARYNX: no thrush or ulceration; good dentition  NECK: supple, no masses felt LYMPH:  no palpable lymphadenopathy in the cervical, axillary or inguinal regions LUNGS: clear to auscultation and  No wheeze or crackles HEART/CVS: regular rate & rhythm and no murmurs; No lower extremity edema ABDOMEN: abdomen soft, non-tender and normal bowel sounds Musculoskeletal:no cyanosis of digits and no clubbing  PSYCH: alert & oriented x 3 with fluent speech NEURO: no focal motor/sensory deficits   LABORATORY DATA:  I have reviewed the data as listed Lab Results  Component Value  Date   WBC 6.8 11/01/2015   HGB 14.4 11/01/2015   HCT 42.8 11/01/2015   MCV 99.5 11/01/2015   PLT 213 11/01/2015    Recent Labs  10/01/15 1055 10/15/15 1047 10/22/15 1150 11/01/15 1030  NA 136 135  --  136  K 4.5 4.3  --  4.0  CL 103 103  --  105  CO2 27 23  --  26  GLUCOSE 118* 165*  --  103*  BUN 15 16  --  12  CREATININE 1.06 1.03  --  0.98  CALCIUM 8.6* 8.8*  --  8.4*  GFRNONAA >60 >60  --  >60  GFRAA >60 >60  --  >60  PROT 7.1 7.2 6.6 6.7  ALBUMIN 4.1 3.8 3.6 3.7  AST 20 270* 110* 40  ALT 14* 401* 185* 47  ALKPHOS 88 72 70 68  BILITOT 0.8 0.9 0.9 0.9  BILIDIR  --   --  <0.1*  --   IBILI  --   --  NOT CALCULATED  --     RADIOGRAPHIC STUDIES: I have personally reviewed the radiological images as listed and agreed with the findings in the report. No results found.  ASSESSMENT & PLAN:   Primary cancer of right kidney with metastasis from kidney to other site [facility]) # METASTATIC RENAL CELL CA s/p resection of LUL lung nodule; stage IV usual incurable [ right lower lobe lung nodule]. Pazopanib 4 pills/day July 20th 2017- STOPPED sec to LFTs elevation.  # LFT today within normal limits; lipase amylase normal. Re-start Pazopanib at 2 pills once a day.   # Hand foot syndrome- G-2 ; improved.   # Boil in anal region/ per--rectal abscess start Augmentin- improved.   # Scleroderma- currently stable on Norvasc; followed by rheumatology.   # Mucositis- discussed re: salt/baking soda rinses.   # weekly labs; follow up with MD in 4 weeks.      Thomas Sickle, MD 11/01/2015 6:11 PM

## 2015-11-08 ENCOUNTER — Other Ambulatory Visit: Payer: Self-pay

## 2015-11-08 ENCOUNTER — Inpatient Hospital Stay: Payer: BLUE CROSS/BLUE SHIELD

## 2015-11-08 DIAGNOSIS — C641 Malignant neoplasm of right kidney, except renal pelvis: Secondary | ICD-10-CM

## 2015-11-08 DIAGNOSIS — C7802 Secondary malignant neoplasm of left lung: Secondary | ICD-10-CM | POA: Diagnosis not present

## 2015-11-08 LAB — HEPATIC FUNCTION PANEL
ALBUMIN: 4.1 g/dL (ref 3.5–5.0)
ALK PHOS: 65 U/L (ref 38–126)
ALT: 99 U/L — ABNORMAL HIGH (ref 17–63)
AST: 87 U/L — ABNORMAL HIGH (ref 15–41)
BILIRUBIN INDIRECT: 0.8 mg/dL (ref 0.3–0.9)
BILIRUBIN TOTAL: 0.9 mg/dL (ref 0.3–1.2)
Bilirubin, Direct: 0.1 mg/dL (ref 0.1–0.5)
TOTAL PROTEIN: 7.3 g/dL (ref 6.5–8.1)

## 2015-11-12 ENCOUNTER — Telehealth: Payer: Self-pay | Admitting: *Deleted

## 2015-11-12 NOTE — Telephone Encounter (Signed)
-----   Message from Cammie Sickle, MD sent at 11/09/2015  4:45 PM EDT ----- l tried to reach pt re: elevated LFTs./ can't leave message.   Please inform pt that- LFTs are slightly elevated; continue 2 pills of Votrient/once a day at this time. Repeat LFTs next week. However if abdominal pain nausea vomiting or any symptoms- stop the Votrient immediately.

## 2015-11-12 NOTE — Telephone Encounter (Signed)
Spoke with patient's wife- Thomas Le.  informed pt that- LFTs are slightly elevated; instructed wife to have pt continue 2 pills of Votrient/once a day at this time. MD will repeat LFTs on Thursday 11/15/15.  Wife states that pt's needs to have his apt   Educated - pt's wife - abdominal pain nausea vomiting or any symptoms - stop the Votrient immediately.  Teach back process performed with wife.

## 2015-11-15 ENCOUNTER — Ambulatory Visit (INDEPENDENT_AMBULATORY_CARE_PROVIDER_SITE_OTHER): Payer: BLUE CROSS/BLUE SHIELD | Admitting: Family Medicine

## 2015-11-15 ENCOUNTER — Inpatient Hospital Stay: Payer: BLUE CROSS/BLUE SHIELD

## 2015-11-15 ENCOUNTER — Encounter: Payer: Self-pay | Admitting: Family Medicine

## 2015-11-15 VITALS — BP 128/78 | HR 61 | Temp 98.2°F | Wt 164.0 lb

## 2015-11-15 DIAGNOSIS — N529 Male erectile dysfunction, unspecified: Secondary | ICD-10-CM | POA: Diagnosis not present

## 2015-11-15 DIAGNOSIS — C799 Secondary malignant neoplasm of unspecified site: Secondary | ICD-10-CM

## 2015-11-15 DIAGNOSIS — R599 Enlarged lymph nodes, unspecified: Secondary | ICD-10-CM | POA: Diagnosis not present

## 2015-11-15 DIAGNOSIS — R590 Localized enlarged lymph nodes: Secondary | ICD-10-CM

## 2015-11-15 DIAGNOSIS — C641 Malignant neoplasm of right kidney, except renal pelvis: Secondary | ICD-10-CM | POA: Diagnosis not present

## 2015-11-15 DIAGNOSIS — C7802 Secondary malignant neoplasm of left lung: Secondary | ICD-10-CM | POA: Diagnosis not present

## 2015-11-15 DIAGNOSIS — J309 Allergic rhinitis, unspecified: Secondary | ICD-10-CM | POA: Insufficient documentation

## 2015-11-15 LAB — HEPATIC FUNCTION PANEL
ALBUMIN: 4.1 g/dL (ref 3.5–5.0)
ALK PHOS: 71 U/L (ref 38–126)
ALT: 63 U/L (ref 17–63)
AST: 64 U/L — ABNORMAL HIGH (ref 15–41)
BILIRUBIN DIRECT: 0.2 mg/dL (ref 0.1–0.5)
BILIRUBIN TOTAL: 1.6 mg/dL — AB (ref 0.3–1.2)
Indirect Bilirubin: 1.4 mg/dL — ABNORMAL HIGH (ref 0.3–0.9)
Total Protein: 7.3 g/dL (ref 6.5–8.1)

## 2015-11-15 MED ORDER — CETIRIZINE HCL 10 MG PO TABS: 10.0000 mg | ORAL_TABLET | Freq: Every day | ORAL | Status: DC

## 2015-11-15 MED ORDER — FLUTICASONE PROPIONATE 50 MCG/ACT NA SUSP: 2.0000 | Freq: Every day | NASAL | Status: DC

## 2015-11-15 NOTE — Assessment & Plan Note (Signed)
Seems to be doing well. Possibly some side effects related to his Votrient. He will discuss this with his oncologist at follow-up in 2 weeks.

## 2015-11-15 NOTE — Patient Instructions (Signed)
Nice to see you. Please discuss the potential side effects of Votrient with your oncologist at your follow-up with them in 2 weeks. Please also discuss whether or not you are cleared from their perspective to return to full physical activity including sexual activity. If they say this is okay we can try going up on the Viagra. We will get you referred to ENT as well.

## 2015-11-15 NOTE — Assessment & Plan Note (Signed)
We will replace the ENT referral to evaluate for lymphadenopathy in the occipital region and cervical region

## 2015-11-15 NOTE — Progress Notes (Signed)
[name], MD Phone: [phone]  Thomas Le is a 60 y.o. male who presents today for follow up.  Lung cancer: Patient notes overall doing well with this. He is back on Votrient. He had some significant skin side effects with this at the full dose though they halved the dose recently and he has not had as many issues. She does feel slightly more tired than usual on the medication. No shortness of breath. He does note a little congestion in his chest after lying down at night with congestion in his nose.  Enlarged lymph node: Notes he never heard from ENT regarding this and then he ended up with lung cancer and going through all that so he wants to make sure he follows up on the lymph node. Is occipital in location. No change in several years.  Erectile dysfunction: Notes the Viagra was not effective at 50 mg when he tried it prior to having his lung surgery. He has not tried to be overly physically active for sexually active since his lung surgery.  PMH: Smoker   ROS see history of present illness  Objective  Physical Exam Vitals:   11/15/15 0958  BP: 128/78  Pulse: 61  Temp: 98.2 F (36.8 C)    BP Readings from Last 3 Encounters:  11/15/15 128/78  11/01/15 132/84  10/15/15 109/75   Wt Readings from Last 3 Encounters:  11/15/15 164 lb (74.4 kg)  11/01/15 170 lb 3.2 oz (77.2 kg)  10/15/15 161 lb 12.8 oz (73.4 kg)    Physical Exam  Constitutional: No distress.  HENT:  Head: Normocephalic and atraumatic.  Mouth/Throat: Oropharynx is clear and moist. No oropharyngeal exudate.  Neck: Neck supple.  Single left-sided occipital palpable lymph node, several cervical lymph nodes that are palpable as well  Cardiovascular: Normal rate, regular rhythm and normal heart sounds.   Pulmonary/Chest: Effort normal and breath sounds normal.  Musculoskeletal: He exhibits no edema.  Neurological: He is alert. Gait normal.  Skin: He is not diaphoretic.     Assessment/Plan:  Please see individual problem list.  Occipital lymphadenopathy We will replace the ENT referral to evaluate for lymphadenopathy in the occipital region and cervical region  Erectile dysfunction Advised patient to hold off on sexual activity at this time. Given recent surgery and undergoing treatment for lung cancer advised to discuss with his oncologist whether or not they felt it would be okay for him to proceed with sexual activity at this time. If they are okay with that we could consider increasing his Viagra to 100 mg daily as needed.  Primary cancer of right kidney with metastasis from kidney to other site [facility]) Seems to be doing well. Possibly some side effects related to his Votrient. He will discuss this with his oncologist at follow-up in 2 weeks.   Allergic rhinitis Suspect upper respiratory symptoms related to allergic rhinitis. He'll trial Zyrtec and Flonase for this.   Orders Placed This Encounter  Procedures  . Ambulatory referral to ENT    Referral Priority:   Routine    Referral Type:   Consultation    Referral Reason:   Specialty Services Required    Requested Specialty:   Otolaryngology    Number of Visits Requested:   1    Meds ordered this encounter  Medications  . fluticasone (FLONASE) 50 MCG/ACT nasal spray    Sig: Place 2 sprays into both nostrils daily.  . cetirizine (ZYRTEC) 10 MG tablet    Sig: Take 1  tablet (10 mg total) by mouth daily.    [name], MD [place]

## 2015-11-15 NOTE — Assessment & Plan Note (Addendum)
Advised patient to hold off on sexual activity at this time. Given recent surgery and undergoing treatment for lung cancer advised to discuss with his oncologist whether or not they felt it would be okay for him to proceed with sexual activity at this time. If they are okay with that we could consider increasing his Viagra to 100 mg daily as needed.

## 2015-11-15 NOTE — Assessment & Plan Note (Signed)
Suspect upper respiratory symptoms related to allergic rhinitis. He'll trial Zyrtec and Flonase for this.

## 2015-11-19 ENCOUNTER — Telehealth: Payer: Self-pay | Admitting: *Deleted

## 2015-11-19 NOTE — Telephone Encounter (Signed)
Left vm for patient to contact cancer ctr re: dosing of votrient and lab results.

## 2015-11-19 NOTE — Telephone Encounter (Signed)
Spoke with patient/s wife. Explained lft results  Keep lab apt  This week and lab/md apt next week.  Keep same dosing of votrient.

## 2015-11-19 NOTE — Telephone Encounter (Signed)
-----   Message from Cammie Sickle, MD sent at 11/16/2015  6:35 PM EDT ----- Weekly LFT; continue current dose of Votrient.

## 2015-11-22 ENCOUNTER — Encounter: Payer: Self-pay | Admitting: Family Medicine

## 2015-11-22 ENCOUNTER — Inpatient Hospital Stay: Payer: BLUE CROSS/BLUE SHIELD

## 2015-11-22 ENCOUNTER — Telehealth: Payer: Self-pay | Admitting: *Deleted

## 2015-11-22 DIAGNOSIS — C7802 Secondary malignant neoplasm of left lung: Secondary | ICD-10-CM | POA: Diagnosis not present

## 2015-11-22 DIAGNOSIS — C641 Malignant neoplasm of right kidney, except renal pelvis: Secondary | ICD-10-CM

## 2015-11-22 LAB — HEPATIC FUNCTION PANEL
ALK PHOS: 70 U/L (ref 38–126)
ALT: 74 U/L — AB (ref 17–63)
AST: 77 U/L — ABNORMAL HIGH (ref 15–41)
Albumin: 4 g/dL (ref 3.5–5.0)
BILIRUBIN INDIRECT: 1.3 mg/dL — AB (ref 0.3–0.9)
BILIRUBIN TOTAL: 1.6 mg/dL — AB (ref 0.3–1.2)
Bilirubin, Direct: 0.3 mg/dL (ref 0.1–0.5)
TOTAL PROTEIN: 7.2 g/dL (ref 6.5–8.1)

## 2015-11-22 NOTE — Telephone Encounter (Signed)
-----   Message from Cammie Sickle, MD sent at 11/22/2015  2:15 PM EDT ----- Please inform patient to stop taking Votrient as his LFTs are going up again. Will plan to start Sunitinib - prescription will be written/ will discuss at next visit.

## 2015-11-22 NOTE — Telephone Encounter (Signed)
Asked patient to stop Votrient. Educated pt to stop votrient.  MD will discuss new drug- sutent next week at patient's apt. Teach back process performed.

## 2015-11-29 ENCOUNTER — Inpatient Hospital Stay: Payer: BLUE CROSS/BLUE SHIELD | Attending: Internal Medicine

## 2015-11-29 ENCOUNTER — Encounter: Payer: Self-pay | Admitting: Internal Medicine

## 2015-11-29 ENCOUNTER — Inpatient Hospital Stay (HOSPITAL_BASED_OUTPATIENT_CLINIC_OR_DEPARTMENT_OTHER): Payer: BLUE CROSS/BLUE SHIELD | Admitting: Internal Medicine

## 2015-11-29 VITALS — BP 112/86 | HR 63 | Temp 96.9°F | Ht 73.0 in | Wt 167.4 lb

## 2015-11-29 DIAGNOSIS — J449 Chronic obstructive pulmonary disease, unspecified: Secondary | ICD-10-CM | POA: Diagnosis not present

## 2015-11-29 DIAGNOSIS — N529 Male erectile dysfunction, unspecified: Secondary | ICD-10-CM | POA: Diagnosis not present

## 2015-11-29 DIAGNOSIS — I10 Essential (primary) hypertension: Secondary | ICD-10-CM | POA: Insufficient documentation

## 2015-11-29 DIAGNOSIS — F17210 Nicotine dependence, cigarettes, uncomplicated: Secondary | ICD-10-CM | POA: Insufficient documentation

## 2015-11-29 DIAGNOSIS — Z79899 Other long term (current) drug therapy: Secondary | ICD-10-CM | POA: Diagnosis not present

## 2015-11-29 DIAGNOSIS — Z86010 Personal history of colonic polyps: Secondary | ICD-10-CM | POA: Insufficient documentation

## 2015-11-29 DIAGNOSIS — K1230 Oral mucositis (ulcerative), unspecified: Secondary | ICD-10-CM | POA: Insufficient documentation

## 2015-11-29 DIAGNOSIS — I739 Peripheral vascular disease, unspecified: Secondary | ICD-10-CM

## 2015-11-29 DIAGNOSIS — Z905 Acquired absence of kidney: Secondary | ICD-10-CM | POA: Insufficient documentation

## 2015-11-29 DIAGNOSIS — M1990 Unspecified osteoarthritis, unspecified site: Secondary | ICD-10-CM

## 2015-11-29 DIAGNOSIS — C7802 Secondary malignant neoplasm of left lung: Secondary | ICD-10-CM

## 2015-11-29 DIAGNOSIS — Z85528 Personal history of other malignant neoplasm of kidney: Secondary | ICD-10-CM | POA: Diagnosis not present

## 2015-11-29 DIAGNOSIS — M069 Rheumatoid arthritis, unspecified: Secondary | ICD-10-CM

## 2015-11-29 DIAGNOSIS — K5790 Diverticulosis of intestine, part unspecified, without perforation or abscess without bleeding: Secondary | ICD-10-CM | POA: Insufficient documentation

## 2015-11-29 DIAGNOSIS — L271 Localized skin eruption due to drugs and medicaments taken internally: Secondary | ICD-10-CM | POA: Insufficient documentation

## 2015-11-29 DIAGNOSIS — M349 Systemic sclerosis, unspecified: Secondary | ICD-10-CM

## 2015-11-29 DIAGNOSIS — R911 Solitary pulmonary nodule: Secondary | ICD-10-CM

## 2015-11-29 DIAGNOSIS — R5383 Other fatigue: Secondary | ICD-10-CM

## 2015-11-29 DIAGNOSIS — R7989 Other specified abnormal findings of blood chemistry: Secondary | ICD-10-CM

## 2015-11-29 DIAGNOSIS — C641 Malignant neoplasm of right kidney, except renal pelvis: Secondary | ICD-10-CM

## 2015-11-29 LAB — HEPATIC FUNCTION PANEL
ALBUMIN: 3.6 g/dL (ref 3.5–5.0)
ALT: 38 U/L (ref 17–63)
AST: 43 U/L — ABNORMAL HIGH (ref 15–41)
Alkaline Phosphatase: 57 U/L (ref 38–126)
BILIRUBIN DIRECT: 0.1 mg/dL (ref 0.1–0.5)
BILIRUBIN TOTAL: 0.4 mg/dL (ref 0.3–1.2)
Indirect Bilirubin: 0.3 mg/dL (ref 0.3–0.9)
Total Protein: 6.5 g/dL (ref 6.5–8.1)

## 2015-11-29 MED ORDER — SUNITINIB MALATE 50 MG PO CAPS: 50.0000 mg | ORAL_CAPSULE | Freq: Every day | ORAL | 6 refills | Status: DC

## 2015-11-29 NOTE — Assessment & Plan Note (Addendum)
#   METASTATIC RENAL CELL CA s/p resection of LUL lung nodule; stage IV usual incurable [ right lower lobe lung nodule]. AEs sec to Pazopanib stopped.   # START Sutent '50mg'$  2 weeks; 1 week OFF.   # No contraindications from oncology stand point re: use of ED medications.   # Scleroderma- currently stable on Norvasc; followed by rheumatology; discussed this could be an issue with immunotherapy.   # hand foot syndrome/ Mucositis prophylaxis discussed. Also discussed regarding fatigue we'll check thyroid panel at next visit.  # follow up with MD in 4 weeks/ also check thyroid profile.

## 2015-11-29 NOTE — Progress Notes (Signed)
Feels better since stopping votrient.

## 2015-11-29 NOTE — Progress Notes (Signed)
[place] NOTE  Patient Care Team: Leone Haven, MD as PCP - General (Family Medicine)  CHIEF COMPLAINTS/PURPOSE OF CONSULTATION:   Oncology History   # JULY 2017 METASTATIC RCC [s/p LUL lung section;Dr.Oaks]; RLL- nodule ~67m  # June 2017- ? Frontal lesion on CT [cannot have MRI]  # RIGHT KIDNEY CANCER [incidental 2012; diverticulitis] pT3a (4.3x4.3x 3.2cm) [[facility] clear cell G-2; Neg margins; May 2012 ]  # July 20th- PAZOPANIB 4 pills/day; Discontinued sec to Elevated LFTs.   # OCT 2017- Start Sunitinib 2w-On; 1 w-OFF  # ? Scleroderma [Almodipine; Dr.Kernodle]     Primary cancer of right kidney with metastasis from kidney to other site ([facility]   08/30/2015 Initial Diagnosis    Primary cancer of right kidney with metastasis from kidney to other site ([facility]        HISTORY OF PRESENTING ILLNESS:  Thomas GOSWAMI89y.o.  male a very pleasant patient newly Diagnosed metastatic renal cell carcinoma to the lung status post resection- currently on Votrient is here for follow-up.   In the interim patient's Votrient were discontinued approximately week ago when his LFTs started going up even on 2 pills of Votrient.  Since stopping Votrient patient seems to improving. His appetite is back. Fatigue is improving. Pain in his hand and feet is improving.   Patient is concerned about erectile dysfunction; is interested in going up on his dose of Viagra.  ROS: A complete 10 point review of system is done which is negative except mentioned above in history of present illness  MEDICAL HISTORY:  Past Medical History:  Diagnosis Date  . Allergic rhinitis   . Anxiety   . Arthritis   . Asthma   . Chronic bronchitis ([place]   . Colitis    Had blood in his stool with this and treated in the hospital  . Colitis cystica profunda   . Colon polyps   . Complication of anesthesia    when waking up get claustophobic with mask, anxiety, panic  . Depression   .  Diverticulitis   . ED (erectile dysfunction)   . Genital herpes   . Hypertension   . Kidney disease   . Medical history non-contributory   . Occipital lymphadenopathy   . Peripheral vascular disease ([place]   . Renal cancer ([place]   . Shortness of breath dyspnea     SURGICAL HISTORY: Past Surgical History:  Procedure Laterality Date  . APPENDECTOMY  1966  . COLON SURGERY    . KIDNEY SURGERY Right 2012   Nephrectomy  . PARTIAL COLECTOMY  2012   For perforated colon related to diverticulitis- Dr. BMarina Gravel . VASECTOMY  2000  . VIDEO ASSISTED THORACOSCOPY (VATS)/THOROCOTOMY Left 08/13/2015   Procedure: LEFT THOROCOTOMY WITH LEFT UPPER LOBECTOMY, PREOP BRONCHOSCOPY;  Surgeon: TNestor Lewandowsky MD;  Location: [facility] ORS;  Service: General;  Laterality: Left;    SOCIAL HISTORY: Social History   Social History  . Marital status: Married    Spouse name: N/A  . Number of children: N/A  . Years of education: N/A   Occupational History  . Not on file.   Social History Main Topics  . Smoking status: Current Every Day Smoker    Packs/day: 0.25    Years: 30.00    Types: Cigarettes  . Smokeless tobacco: Never Used  . Alcohol use 3.0 oz/week    5 Cans of beer per week     Comment: 5 beers a night   .  Drug use: No  . Sexual activity: Yes   Other Topics Concern  . Not on file   Social History Narrative  . No narrative on file    FAMILY HISTORY: Family History  Problem Relation Age of Onset  . Arthritis/Rheumatoid      Parent  . Heart disease      Grandparent  . Alcoholism      Other relative  . Arthritis Mother     Rhematoid    ALLERGIES:  has No Known Allergies.  MEDICATIONS:  Current Outpatient Prescriptions  Medication Sig Dispense Refill  . amLODipine (NORVASC) 2.5 MG tablet Take 1 tablet by mouth daily.    Marland Kitchen ibuprofen (ADVIL,MOTRIN) 200 MG tablet Take 1 tablet by mouth every 6 (six) hours as needed.    . loperamide (IMODIUM A-D) 2 MG tablet Take 2 mg by mouth 4  (four) times daily as needed for diarrhea or loose stools.    . meloxicam (MOBIC) 7.5 MG tablet Take 1 tablet by mouth daily.    . mupirocin nasal ointment (BACTROBAN NASAL) 2 % Place 1 application into the nose 2 (two) times daily. Use one-half of tube in each nostril twice daily for five (5) days. After application, press sides of nose together and gently massage. 10 g 0  . sildenafil (VIAGRA) 50 MG tablet Take 1 tablet (50 mg total) by mouth daily as needed for erectile dysfunction. 5 tablet 0  . cetirizine (ZYRTEC) 10 MG tablet Take 1 tablet (10 mg total) by mouth daily. (Patient not taking: Reported on 11/29/2015)    . SUNItinib (SUTENT) 50 MG capsule Take 1 capsule (50 mg total) by mouth daily. For 2 weeks; and then OFF 1 week. 14 capsule 6   Current Facility-Administered Medications  Medication Dose Route Frequency Provider Last Rate Last Dose  . vancomycin (VANCOCIN) 1,000 mg in sodium chloride 0.9 % 500 mL IVPB  1,000 mg Intravenous Once Nestor Lewandowsky, MD          .  PHYSICAL EXAMINATION: ECOG PERFORMANCE STATUS: 0 - Asymptomatic  Vitals:   11/29/15 1139  BP: 112/86  Pulse: 63  Temp: (!) 96.9 F (36.1 C)   Filed Weights   11/29/15 1139  Weight: 167 lb 6.4 oz (75.9 kg)    GENERAL: Thin built moderately nourished lert, no distress and comfortable. He isAccompanied by his wife. He feels significantly improved since last visit. EYES: no pallor or icterus OROPHARYNX: no thrush or ulceration; good dentition  NECK: supple, no masses felt LYMPH:  no palpable lymphadenopathy in the cervical, axillary or inguinal regions LUNGS: clear to auscultation and  No wheeze or crackles HEART/CVS: regular rate & rhythm and no murmurs; No lower extremity edema ABDOMEN: abdomen soft, non-tender and normal bowel sounds Musculoskeletal:no cyanosis of digits and no clubbing  PSYCH: alert & oriented x 3 with fluent speech NEURO: no focal motor/sensory deficits   LABORATORY DATA:  I have  reviewed the data as listed Lab Results  Component Value Date   WBC 6.8 11/01/2015   HGB 14.4 11/01/2015   HCT 42.8 11/01/2015   MCV 99.5 11/01/2015   PLT 213 11/01/2015    Recent Labs  10/01/15 1055 10/15/15 1047  11/01/15 1030  11/15/15 0933 11/22/15 1034 11/29/15 1115  NA 136 135  --  136  --   --   --   --   K 4.5 4.3  --  4.0  --   --   --   --  CL 103 103  --  105  --   --   --   --   CO2 27 23  --  26  --   --   --   --   GLUCOSE 118* 165*  --  103*  --   --   --   --   BUN 15 16  --  12  --   --   --   --   CREATININE 1.06 1.03  --  0.98  --   --   --   --   CALCIUM 8.6* 8.8*  --  8.4*  --   --   --   --   GFRNONAA >60 >60  --  >60  --   --   --   --   GFRAA >60 >60  --  >60  --   --   --   --   PROT 7.1 7.2  < > 6.7  < > 7.3 7.2 6.5  ALBUMIN 4.1 3.8  < > 3.7  < > 4.1 4.0 3.6  AST 20 270*  < > 40  < > 64* 77* 43*  ALT 14* 401*  < > 47  < > 63 74* 38  ALKPHOS 88 72  < > 68  < > 71 70 57  BILITOT 0.8 0.9  < > 0.9  < > 1.6* 1.6* 0.4  BILIDIR  --   --   < >  --   < > 0.2 0.3 0.1  IBILI  --   --   < >  --   < > 1.4* 1.3* 0.3  < > = values in this interval not displayed.  RADIOGRAPHIC STUDIES: I have personally reviewed the radiological images as listed and agreed with the findings in the report. No results found.  ASSESSMENT & PLAN:   Primary cancer of right kidney with metastasis from kidney to other site [facility]) # METASTATIC RENAL CELL CA s/p resection of LUL lung nodule; stage IV usual incurable [ right lower lobe lung nodule]. AEs sec to Pazopanib stopped.   # START Sutent '50mg'$  2 weeks; 1 week OFF.   # No contraindications from oncology stand point re: use of ED medications.   # Scleroderma- currently stable on Norvasc; followed by rheumatology; discussed this could be an issue with immunotherapy.   # hand foot syndrome/ Mucositis prophylaxis discussed. Also discussed regarding fatigue we'll check thyroid panel at next visit.  # follow up with MD in 4  weeks/ also check thyroid profile.      Cammie Sickle, MD 11/30/2015 7:51 AM

## 2015-11-29 NOTE — Patient Instructions (Signed)
Sunitinib oral capsules What is this medicine? SUNITINIB (soo NI ti nib) is a medicine that targets proteins in cancer cells and stops the cancer cells from growing. It is used to treat specific digestive tract tumors called GISTs, advanced kidney cancer, and certain pancreatic neuroendocrine tumors. This medicine may be used for other purposes; ask your health care provider or pharmacist if you have questions. What should I tell my health care provider before I take this medicine? They need to know if you have any of these conditions: -bleeding problems -dental disease -infection (especially a virus infection such as chickenpox, cold sores, or herpes) -heart disease -heart failure -high blood pressure -kidney disease (other than cancer) -liver disease -lung disease -seizures -an unusual or allergic reaction to sunitinib, other medicines, foods, dyes, or preservatives -pregnant or trying to get pregnant -breast-feeding How should I use this medicine? Take this medicine by mouth with a glass of water. Follow the directions on the prescription label. You can take it with or without food. Take your medicine at regular intervals. Do not take your medicine more often than directed. Do not stop taking except on your doctor's advice. Talk to your pediatrician regarding the use of this medicine in children. Special care may be needed. Overdosage: If you think you have taken too much of this medicine contact a poison control center or emergency room at once. NOTE: This medicine is only for you. Do not share this medicine with others. What if I miss a dose? If you miss a dose, take it as soon as you can. If it is almost time for your next dose, take only that dose. Do not take double or extra doses. Tell your doctor if you miss a dose. What may interact with this medicine? Do not take this medicine with any of the following medications: -certain medicines for fungal infections like fluconazole,  itraconazole, ketoconazole, posaconazole, voriconazole -cisapride -dofetilide -dronedarone -grapefruit juice -pimozide -St. John's Wort -thioridazine -ziprasidone This medicine may also interact with the following medications: -antiviral medicines for HIV or AIDS -barbiturates like phenobarbital -carbamazepine -certain antibiotics like clarithromycin, erythromycin, levofloxacin, mefloquine, telithromycin, rifabutin, rifampin, rifapentine -certain medicines for depression, anxiety, or psychotic disturbances -certain medicines for irregular heart beat like amiodarone, bepridil, encainide, flecainide, propafenone, quinidine -certain medicines for numbness or sleep during surgery -dexamethasone -other medicines that prolong the QT interval (cause an abnormal heart rhythm) -phenytoin This list may not describe all possible interactions. Give your health care provider a list of all the medicines, herbs, non-prescription drugs, or dietary supplements you use. Also tell them if you smoke, drink alcohol, or use illegal drugs. Some items may interact with your medicine. What should I watch for while using this medicine? Visit your doctor for regular check ups. Talk to your doctor about any new or unusual health problems. You will need blood work done while you are taking this medicine. If you have any dental work done, tell your dentist you are receiving this medicine. Do not become pregnant while taking this medicine. Male and male patients should use effective birth control methods while taking this medicine. Women should inform their doctor if they wish to become pregnant or think they might be pregnant. There is a potential for serious side effects to an unborn child. Talk to your health care professional or pharmacist for more information. Do not breast-feed an infant while taking this medicine. What side effects may I notice from receiving this medicine? Side effects that you should report to  your  doctor or health care professional as soon as possible: -allergic reactions like skin rash, itching or hives, swelling of the face, lips, or tongue -breathing problems -confusion -changes in vision -dark urine -feeling faint or lightheaded, falls -fever or chills, cough, sore throat -high blood pressure -jaw pain, especially after dental work -mouth sores -seizures -stomach pain -swelling of feet, legs -trouble passing urine or change in the amount of urine -unusual bleeding or bruising -unusually weak or tired Side effects that usually do not require medical attention (report to your doctor or health care professional if they continue or are bothersome): -bone or muscle pain -change in hair color (lighter) -changes in taste -diarrhea -loss of appetite -nausea, vomiting -skin that is cracked, dry, thick, yellow or lightened -stomach upset This list may not describe all possible side effects. Call your doctor for medical advice about side effects. You may report side effects to FDA at 1-800-FDA-1088. Where should I keep my medicine? Keep out of the reach of children. Store at room temperature between 15 and 30 degrees C (59 and 86 degrees F). Throw away any unused medicine after the expiration date. NOTE: This sheet is a summary. It may not cover all possible information. If you have questions about this medicine, talk to your doctor, pharmacist, or health care provider.    2016, [employer] Standard. (2014-04-19 22:19:15)

## 2015-11-30 ENCOUNTER — Telehealth: Payer: Self-pay | Admitting: *Deleted

## 2015-11-30 NOTE — Telephone Encounter (Signed)
Pt was checking the status of having a higher dose of viagra, a note was being sent over to Dr Caryl Bis from pt's Oncology provide Dr. Rogue Bussing stating that pt was fit enough to have a higher dosage. Pt contact [phone] -work

## 2015-12-03 MED ORDER — SILDENAFIL CITRATE 50 MG PO TABS: 100.0000 mg | ORAL_TABLET | Freq: Every day | ORAL | 0 refills | Status: DC | PRN

## 2015-12-03 NOTE — Telephone Encounter (Signed)
Notified patient of message. Patient verbalized understanding of everything.

## 2015-12-03 NOTE — Telephone Encounter (Signed)
I received the note. We will increase the Viagra to 100 mg. A new prescription was sent to the pharmacy. Please inform the patient that if he ever has to call EMS or go to the emergency room he needs to inform them that he has taken this. If he has chest pain or trouble breathing while being physically active he needs to discontinue the activity.

## 2015-12-07 ENCOUNTER — Telehealth: Payer: Self-pay | Admitting: *Deleted

## 2015-12-07 NOTE — Telephone Encounter (Signed)
Patient received sutent in mail. Waiting on instructions to start sutent.  RN - instructed patient per v/o Dr. Rogue Bussing to have patient start today. Pt states that he would rather start drug in am tomorrow.

## 2015-12-21 ENCOUNTER — Telehealth: Payer: Self-pay | Admitting: *Deleted

## 2015-12-21 NOTE — Telephone Encounter (Signed)
Called to report that he has 3 more days of votrient this cycle and is having side effects, diarrhea, rectal soreness, nasal sores, Extreme fatigue, Dizziness, and very dark urine. Please advise

## 2015-12-21 NOTE — Telephone Encounter (Signed)
Hold for the weekend and we will follow up on Monday. If symptoms get worse, go to ER.

## 2015-12-21 NOTE — Telephone Encounter (Signed)
Have attempted to call patient on several numbers and have not gotten an answer. I finally got an answer on the home phone and left message with his sone to hold med and go to ER if he gets worse

## 2015-12-27 ENCOUNTER — Inpatient Hospital Stay (HOSPITAL_BASED_OUTPATIENT_CLINIC_OR_DEPARTMENT_OTHER): Payer: BLUE CROSS/BLUE SHIELD | Admitting: Internal Medicine

## 2015-12-27 ENCOUNTER — Inpatient Hospital Stay: Payer: BLUE CROSS/BLUE SHIELD | Attending: Internal Medicine

## 2015-12-27 VITALS — BP 117/73 | HR 58 | Temp 96.9°F | Resp 20 | Wt 168.0 lb

## 2015-12-27 DIAGNOSIS — R5383 Other fatigue: Secondary | ICD-10-CM | POA: Insufficient documentation

## 2015-12-27 DIAGNOSIS — I272 Pulmonary hypertension, unspecified: Secondary | ICD-10-CM | POA: Insufficient documentation

## 2015-12-27 DIAGNOSIS — L271 Localized skin eruption due to drugs and medicaments taken internally: Secondary | ICD-10-CM

## 2015-12-27 DIAGNOSIS — M349 Systemic sclerosis, unspecified: Secondary | ICD-10-CM

## 2015-12-27 DIAGNOSIS — C641 Malignant neoplasm of right kidney, except renal pelvis: Secondary | ICD-10-CM

## 2015-12-27 DIAGNOSIS — J449 Chronic obstructive pulmonary disease, unspecified: Secondary | ICD-10-CM | POA: Insufficient documentation

## 2015-12-27 DIAGNOSIS — F17210 Nicotine dependence, cigarettes, uncomplicated: Secondary | ICD-10-CM

## 2015-12-27 DIAGNOSIS — R911 Solitary pulmonary nodule: Secondary | ICD-10-CM | POA: Diagnosis not present

## 2015-12-27 DIAGNOSIS — Z905 Acquired absence of kidney: Secondary | ICD-10-CM | POA: Insufficient documentation

## 2015-12-27 DIAGNOSIS — I10 Essential (primary) hypertension: Secondary | ICD-10-CM | POA: Diagnosis not present

## 2015-12-27 DIAGNOSIS — R7989 Other specified abnormal findings of blood chemistry: Secondary | ICD-10-CM | POA: Insufficient documentation

## 2015-12-27 DIAGNOSIS — Z85528 Personal history of other malignant neoplasm of kidney: Secondary | ICD-10-CM

## 2015-12-27 DIAGNOSIS — Z79899 Other long term (current) drug therapy: Secondary | ICD-10-CM

## 2015-12-27 DIAGNOSIS — M1990 Unspecified osteoarthritis, unspecified site: Secondary | ICD-10-CM | POA: Insufficient documentation

## 2015-12-27 DIAGNOSIS — C7802 Secondary malignant neoplasm of left lung: Secondary | ICD-10-CM

## 2015-12-27 DIAGNOSIS — Z86010 Personal history of colonic polyps: Secondary | ICD-10-CM

## 2015-12-27 DIAGNOSIS — I739 Peripheral vascular disease, unspecified: Secondary | ICD-10-CM

## 2015-12-27 DIAGNOSIS — I700 Atherosclerosis of aorta: Secondary | ICD-10-CM | POA: Insufficient documentation

## 2015-12-27 LAB — COMPREHENSIVE METABOLIC PANEL
ALK PHOS: 63 U/L (ref 38–126)
ALT: 13 U/L — AB (ref 17–63)
AST: 24 U/L (ref 15–41)
Albumin: 3.7 g/dL (ref 3.5–5.0)
Anion gap: 4 — ABNORMAL LOW (ref 5–15)
BILIRUBIN TOTAL: 0.7 mg/dL (ref 0.3–1.2)
BUN: 16 mg/dL (ref 6–20)
CALCIUM: 8.3 mg/dL — AB (ref 8.9–10.3)
CO2: 26 mmol/L (ref 22–32)
CREATININE: 1.05 mg/dL (ref 0.61–1.24)
Chloride: 103 mmol/L (ref 101–111)
Glucose, Bld: 100 mg/dL — ABNORMAL HIGH (ref 65–99)
Potassium: 4.1 mmol/L (ref 3.5–5.1)
SODIUM: 133 mmol/L — AB (ref 135–145)
TOTAL PROTEIN: 6.6 g/dL (ref 6.5–8.1)

## 2015-12-27 LAB — CBC WITH DIFFERENTIAL/PLATELET
Basophils Absolute: 0.1 10*3/uL (ref 0–0.1)
Basophils Relative: 2 %
Eosinophils Absolute: 0.2 10*3/uL (ref 0–0.7)
Eosinophils Relative: 5 %
HEMATOCRIT: 45.5 % (ref 40.0–52.0)
HEMOGLOBIN: 15.4 g/dL (ref 13.0–18.0)
LYMPHS ABS: 1.1 10*3/uL (ref 1.0–3.6)
LYMPHS PCT: 23 %
MCH: 34.6 pg — AB (ref 26.0–34.0)
MCHC: 33.9 g/dL (ref 32.0–36.0)
MCV: 102.1 fL — AB (ref 80.0–100.0)
MONOS PCT: 7 %
Monocytes Absolute: 0.4 10*3/uL (ref 0.2–1.0)
NEUTROS PCT: 63 %
Neutro Abs: 3 10*3/uL (ref 1.4–6.5)
Platelets: 148 10*3/uL — ABNORMAL LOW (ref 150–440)
RBC: 4.46 MIL/uL (ref 4.40–5.90)
RDW: 15.1 % — ABNORMAL HIGH (ref 11.5–14.5)
WBC: 4.8 10*3/uL (ref 3.8–10.6)

## 2015-12-27 LAB — TSH: TSH: 4.118 u[IU]/mL (ref 0.350–4.500)

## 2015-12-27 MED ORDER — SUNITINIB MALATE 50 MG PO CAPS: 50.0000 mg | ORAL_CAPSULE | Freq: Every day | ORAL | 6 refills | Status: DC

## 2015-12-27 NOTE — Patient Instructions (Signed)
Start SUTENT 1 week ON & 1 week OFF

## 2015-12-27 NOTE — Assessment & Plan Note (Addendum)
#   METASTATIC RENAL CELL CA s/p resection of LUL lung nodule; stage IV usual incurable [ right lower lobe lung nodule]. Currently on Sutent '50mg'$  2 weeks; 1 week OFF tolerating with mild-mod difficulties [extreme fatigue/ hand foot syndrome]  #  Recommend sutent 50 mg 1 week -ON and 1 week OFF given significant side effects. If continues to be a problem then dose cut to 37.'5mg'$ .   # Scleroderma- currently stable on Norvasc; followed by rheumatology; discussed this could be an issue with immunotherapy.   # hand foot syndrome- .vaseline/ hydrocortisone. discussed re: foot care/hand care.   # follow up with MD in 2 weeks/ Ct scan in 10days; labs cbc/cmp.

## 2015-12-27 NOTE — Progress Notes (Signed)
[place] NOTE  Patient Care Team: Leone Haven, MD as PCP - General (Family Medicine)  CHIEF COMPLAINTS/PURPOSE OF CONSULTATION:   Oncology History   # JULY 2017 METASTATIC RCC [s/p LUL lung section;Dr.Oaks]; RLL- nodule ~69m  # June 2017- ? Frontal lesion on CT [cannot have MRI]  # RIGHT KIDNEY CANCER [incidental 2012; diverticulitis] pT3a (4.3x4.3x 3.2cm) [[facility] clear cell G-2; Neg margins; May 2012 ]  # July 20th- PAZOPANIB 4 pills/day; Discontinued sec to Elevated LFTs.   # OCT 2017- Start Sunitinib 2w-On; 1 w-OFF  # ? Scleroderma [Almodipine; Dr.Kernodle]     Primary cancer of right kidney with metastasis from kidney to other site ([facility]   08/30/2015 Initial Diagnosis    Primary cancer of right kidney with metastasis from kidney to other site ([facility]        HISTORY OF PRESENTING ILLNESS:  Thomas [id number].  male a very pleasant patient newly Diagnosed metastatic renal cell carcinoma to the lung status post resection- currently on Sutent is here for follow-up.   Patient has noted multiple complaints- at about 10-12 days post starting sunitinib. Soreness in his hand and feet; extreme fatigue. Also complains of soreness in his perineum and groin. He stopped taking it approximately 7 days ago. Most of the symptoms is significant improved. He denies any shortness of breath or chest pain. Denies any cough. Denies any bone pain.  ROS: A complete 10 point review of system is done which is negative except mentioned above in history of present illness  MEDICAL HISTORY:  Past Medical History:  Diagnosis Date  . Allergic rhinitis   . Anxiety   . Arthritis   . Asthma   . Chronic bronchitis ([place]   . Colitis    Had blood in his stool with this and treated in the hospital  . Colitis cystica profunda   . Colon polyps   . Complication of anesthesia    when waking up get claustophobic with mask, anxiety, panic  . Depression   . Diverticulitis   .  ED (erectile dysfunction)   . Genital herpes   . Hypertension   . Kidney disease   . Medical history non-contributory   . Occipital lymphadenopathy   . [place]ipheral vascular disease ([place]   . [place] cancer [place]   . Shortness of breath dyspnea     SURGICAL HISTORY: Past Surgical History:  Procedure Laterality Date  . APPENDECTOMY  1966  . COLON SURGERY    . KIDNEY SURGERY Right 2012   Nephrectomy  . PARTIAL COLECTOMY  2012   For perforated colon related to diverticulitis- Dr. BMarina Gravel . VASECTOMY  2000  . VIDEO ASSISTED THORACOSCOPY (VATS)/THOROCOTOMY Left 08/13/2015   Procedure: LEFT THOROCOTOMY WITH LEFT UPPER LOBECTOMY, PREOP BRONCHOSCOPY;  Surgeon: TNestor Lewandowsky MD;  Location: [facility] ORS;  Service: General;  Laterality: Left;    SOCIAL HISTORY: Social History   Social History  . Marital status: Married    Spouse name: N/A  . Number of children: N/A  . Years of education: N/A   Occupational History  . Not on file.   Social History Main Topics  . Smoking status: Current Every Day Smoker    Packs/day: 0.25    Years: 30.00    Types: Cigarettes  . Smokeless tobacco: Never Used  . Alcohol use 3.0 oz/week    5 Cans of beer per week     Comment: 5 beers a night   . Drug use:  No  . Sexual activity: Yes   Other Topics Concern  . Not on file   Social History Narrative  . No narrative on file    FAMILY HISTORY: Family History  Problem Relation Age of Onset  . Arthritis/Rheumatoid      Parent  . Heart disease      Grandparent  . Alcoholism      Other relative  . Arthritis Mother     Rhematoid    ALLERGIES:  has No Known Allergies.  MEDICATIONS:  Current Outpatient Prescriptions  Medication Sig Dispense Refill  . amLODipine (NORVASC) 2.5 MG tablet Take 1 tablet by mouth daily.    . cetirizine (ZYRTEC) 10 MG tablet Take 1 tablet (10 mg total) by mouth daily.    Marland Kitchen ibuprofen (ADVIL,MOTRIN) 200 MG tablet Take 1 tablet by mouth every 6 (six) hours as needed.    .  loperamide (IMODIUM A-D) 2 MG tablet Take 2 mg by mouth 4 (four) times daily as needed for diarrhea or loose stools.    . meloxicam (MOBIC) 7.5 MG tablet Take 1 tablet by mouth daily.    . mupirocin nasal ointment (BACTROBAN NASAL) 2 % Place 1 application into the nose 2 (two) times daily. Use one-half of tube in each nostril twice daily for five (5) days. After application, press sides of nose together and gently massage. 10 g 0  . sildenafil (VIAGRA) 50 MG tablet Take 2 tablets (100 mg total) by mouth daily as needed for erectile dysfunction. 6 tablet 0  . SUNItinib (SUTENT) 50 MG capsule Take 1 capsule (50 mg total) by mouth daily. New plan:  1 week on and 1 week off 7 capsule 6   Current Facility-Administered Medications  Medication Dose Route Frequency Provider Last Rate Last Dose  . vancomycin (VANCOCIN) 1,000 mg in sodium chloride 0.9 % 500 mL IVPB  1,000 mg Intravenous Once Nestor Lewandowsky, MD          .  PHYSICAL EXAMINATION: ECOG PERFORMANCE STATUS: 0 - Asymptomatic  Vitals:   12/27/15 1040  BP: 117/73  Pulse: (!) 58  Resp: 20  Temp: (!) 96.9 F (36.1 C)   Filed Weights   12/27/15 1040  Weight: 168 lb (76.2 kg)    GENERAL: Thin built moderately nourished lert, no distress and comfortable. He is alone.  He feels better.  EYES: no pallor or icterus OROPHARYNX: no thrush or ulceration; good dentition  NECK: supple, no masses felt LYMPH:  no palpable lymphadenopathy in the cervical, axillary or inguinal regions LUNGS: clear to auscultation and  No wheeze or crackles HEART/CVS: regular rate & rhythm and no murmurs; No lower extremity edema ABDOMEN: abdomen soft, non-tender and normal bowel sounds Musculoskeletal:no cyanosis of digits and no clubbing; purplish discoloration of finger tips/ toes [chronic] PSYCH: alert & oriented x 3 with fluent speech NEURO: no focal motor/sensory deficits   LABORATORY DATA:  I have reviewed the data as listed Lab Results  Component  Value Date   WBC 4.8 12/27/2015   HGB 15.4 12/27/2015   HCT 45.5 12/27/2015   MCV 102.1 (H) 12/27/2015   PLT 148 (L) 12/27/2015    Recent Labs  10/15/15 1047  11/01/15 1030  11/15/15 0933 11/22/15 1034 11/29/15 1115 12/27/15 1020  NA 135  --  136  --   --   --   --  133*  K 4.3  --  4.0  --   --   --   --  4.1  CL 103  --  105  --   --   --   --  103  CO2 23  --  26  --   --   --   --  26  GLUCOSE 165*  --  103*  --   --   --   --  100*  BUN 16  --  12  --   --   --   --  16  CREATININE 1.03  --  0.98  --   --   --   --  1.05  CALCIUM 8.8*  --  8.4*  --   --   --   --  8.3*  GFRNONAA >60  --  >60  --   --   --   --  >60  GFRAA >60  --  >60  --   --   --   --  >60  PROT 7.2  < > 6.7  < > 7.3 7.2 6.5 6.6  ALBUMIN 3.8  < > 3.7  < > 4.1 4.0 3.6 3.7  AST 270*  < > 40  < > 64* 77* 43* 24  ALT 401*  < > 47  < > 63 74* 38 13*  ALKPHOS 72  < > 68  < > 71 70 57 63  BILITOT 0.9  < > 0.9  < > 1.6* 1.6* 0.4 0.7  BILIDIR  --   < >  --   < > 0.2 0.3 0.1  --   IBILI  --   < >  --   < > 1.4* 1.3* 0.3  --   < > = values in this interval not displayed.  RADIOGRAPHIC STUDIES: I have personally reviewed the radiological images as listed and agreed with the findings in the report. No results found.  ASSESSMENT & PLAN:   Primary cancer of right kidney with metastasis from kidney to other site [facility]) # METASTATIC RENAL CELL CA s/p resection of LUL lung nodule; stage IV usual incurable [ right lower lobe lung nodule]. Currently on Sutent '50mg'$  2 weeks; 1 week OFF tolerating with mild-mod difficulties [extreme fatigue/ hand foot syndrome]  #  Recommend sutent 50 mg 1 week -ON and 1 week OFF given significant side effects. If continues to be a problem then dose cut to 37.'5mg'$ .   # Scleroderma- currently stable on Norvasc; followed by rheumatology; discussed this could be an issue with immunotherapy.   # hand foot syndrome- .vaseline/ hydrocortisone. discussed re: foot care/hand care.   # follow  up with MD in 2 weeks/ Ct scan in 10days; labs cbc/cmp.      Cammie Sickle, MD 12/27/2015 12:31 PM

## 2015-12-27 NOTE — Progress Notes (Signed)
Patient is here today for follow up, he was not doing so well on Sutent but seems to be doing better today.

## 2016-01-03 ENCOUNTER — Telehealth: Payer: Self-pay | Admitting: *Deleted

## 2016-01-03 ENCOUNTER — Ambulatory Visit: Payer: BLUE CROSS/BLUE SHIELD | Admitting: Internal Medicine

## 2016-01-03 ENCOUNTER — Other Ambulatory Visit: Payer: BLUE CROSS/BLUE SHIELD

## 2016-01-03 NOTE — Telephone Encounter (Signed)
States he called [employer] as instructed to do at his last appt by D rB, His dose has been changed from 2 weeks on 1 week off to 1 week on 1 week off. He has still not received his medicine and would like [place] to assist in this.  I called [employer] who said they had to do  Another benefits investigation and they are ready to reach out to him for shipment.   I advised patient of this, if he does not get call, he will call them

## 2016-01-07 ENCOUNTER — Ambulatory Visit
Admission: RE | Admit: 2016-01-07 | Discharge: 2016-01-07 | Disposition: A | Discharge status: Home / Self Care | Payer: BLUE CROSS/BLUE SHIELD | Source: Ambulatory Visit | Attending: Internal Medicine | Admitting: Internal Medicine

## 2016-01-07 DIAGNOSIS — C799 Secondary malignant neoplasm of unspecified site: Secondary | ICD-10-CM | POA: Diagnosis present

## 2016-01-07 DIAGNOSIS — I700 Atherosclerosis of aorta: Secondary | ICD-10-CM | POA: Insufficient documentation

## 2016-01-07 DIAGNOSIS — F17210 Nicotine dependence, cigarettes, uncomplicated: Secondary | ICD-10-CM | POA: Diagnosis not present

## 2016-01-07 DIAGNOSIS — Z905 Acquired absence of kidney: Secondary | ICD-10-CM | POA: Insufficient documentation

## 2016-01-07 DIAGNOSIS — R911 Solitary pulmonary nodule: Secondary | ICD-10-CM | POA: Insufficient documentation

## 2016-01-07 DIAGNOSIS — I2510 Atherosclerotic heart disease of native coronary artery without angina pectoris: Secondary | ICD-10-CM | POA: Insufficient documentation

## 2016-01-07 DIAGNOSIS — D3502 Benign neoplasm of left adrenal gland: Secondary | ICD-10-CM | POA: Diagnosis not present

## 2016-01-07 DIAGNOSIS — C641 Malignant neoplasm of right kidney, except renal pelvis: Secondary | ICD-10-CM | POA: Insufficient documentation

## 2016-01-09 ENCOUNTER — Other Ambulatory Visit: Payer: Self-pay

## 2016-01-09 DIAGNOSIS — C641 Malignant neoplasm of right kidney, except renal pelvis: Secondary | ICD-10-CM

## 2016-01-10 ENCOUNTER — Inpatient Hospital Stay: Payer: BLUE CROSS/BLUE SHIELD

## 2016-01-10 ENCOUNTER — Inpatient Hospital Stay (HOSPITAL_BASED_OUTPATIENT_CLINIC_OR_DEPARTMENT_OTHER): Payer: BLUE CROSS/BLUE SHIELD | Admitting: Internal Medicine

## 2016-01-10 VITALS — BP 149/101 | HR 66 | Temp 97.6°F | Resp 20 | Ht 73.0 in | Wt 169.0 lb

## 2016-01-10 DIAGNOSIS — C7802 Secondary malignant neoplasm of left lung: Secondary | ICD-10-CM

## 2016-01-10 DIAGNOSIS — I700 Atherosclerosis of aorta: Secondary | ICD-10-CM

## 2016-01-10 DIAGNOSIS — Z85528 Personal history of other malignant neoplasm of kidney: Secondary | ICD-10-CM

## 2016-01-10 DIAGNOSIS — M1990 Unspecified osteoarthritis, unspecified site: Secondary | ICD-10-CM

## 2016-01-10 DIAGNOSIS — F17210 Nicotine dependence, cigarettes, uncomplicated: Secondary | ICD-10-CM

## 2016-01-10 DIAGNOSIS — I10 Essential (primary) hypertension: Secondary | ICD-10-CM

## 2016-01-10 DIAGNOSIS — R5383 Other fatigue: Secondary | ICD-10-CM

## 2016-01-10 DIAGNOSIS — L271 Localized skin eruption due to drugs and medicaments taken internally: Secondary | ICD-10-CM

## 2016-01-10 DIAGNOSIS — R911 Solitary pulmonary nodule: Secondary | ICD-10-CM

## 2016-01-10 DIAGNOSIS — I739 Peripheral vascular disease, unspecified: Secondary | ICD-10-CM

## 2016-01-10 DIAGNOSIS — J449 Chronic obstructive pulmonary disease, unspecified: Secondary | ICD-10-CM

## 2016-01-10 DIAGNOSIS — R7989 Other specified abnormal findings of blood chemistry: Secondary | ICD-10-CM

## 2016-01-10 DIAGNOSIS — C641 Malignant neoplasm of right kidney, except renal pelvis: Secondary | ICD-10-CM

## 2016-01-10 DIAGNOSIS — I272 Pulmonary hypertension, unspecified: Secondary | ICD-10-CM

## 2016-01-10 DIAGNOSIS — Z79899 Other long term (current) drug therapy: Secondary | ICD-10-CM

## 2016-01-10 DIAGNOSIS — M349 Systemic sclerosis, unspecified: Secondary | ICD-10-CM

## 2016-01-10 DIAGNOSIS — Z905 Acquired absence of kidney: Secondary | ICD-10-CM | POA: Diagnosis not present

## 2016-01-10 LAB — CBC WITH DIFFERENTIAL/PLATELET
BASOS ABS: 0 10*3/uL (ref 0–0.1)
BASOS PCT: 1 %
EOS PCT: 4 %
Eosinophils Absolute: 0.1 10*3/uL (ref 0–0.7)
HEMATOCRIT: 47 % (ref 40.0–52.0)
Hemoglobin: 16 g/dL (ref 13.0–18.0)
Lymphocytes Relative: 39 %
Lymphs Abs: 1.6 10*3/uL (ref 1.0–3.6)
MCH: 35.1 pg — ABNORMAL HIGH (ref 26.0–34.0)
MCHC: 34.1 g/dL (ref 32.0–36.0)
MCV: 103 fL — AB (ref 80.0–100.0)
MONO ABS: 0.4 10*3/uL (ref 0.2–1.0)
MONOS PCT: 11 %
Neutro Abs: 1.8 10*3/uL (ref 1.4–6.5)
Neutrophils Relative %: 45 %
PLATELETS: 206 10*3/uL (ref 150–440)
RBC: 4.57 MIL/uL (ref 4.40–5.90)
RDW: 15.4 % — AB (ref 11.5–14.5)
WBC: 4 10*3/uL (ref 3.8–10.6)

## 2016-01-10 LAB — COMPREHENSIVE METABOLIC PANEL
ALBUMIN: 4 g/dL (ref 3.5–5.0)
ALT: 14 U/L — ABNORMAL LOW (ref 17–63)
AST: 26 U/L (ref 15–41)
Alkaline Phosphatase: 61 U/L (ref 38–126)
Anion gap: 6 (ref 5–15)
BILIRUBIN TOTAL: 0.7 mg/dL (ref 0.3–1.2)
BUN: 14 mg/dL (ref 6–20)
CO2: 28 mmol/L (ref 22–32)
Calcium: 8.9 mg/dL (ref 8.9–10.3)
Chloride: 103 mmol/L (ref 101–111)
Creatinine, Ser: 1.12 mg/dL (ref 0.61–1.24)
GFR calc Af Amer: >60 mL/min (ref >60)
Glucose, Bld: 96 mg/dL (ref 65–99)
POTASSIUM: 4.1 mmol/L (ref 3.5–5.1)
Sodium: 137 mmol/L (ref 135–145)
TOTAL PROTEIN: 7.4 g/dL (ref 6.5–8.1)

## 2016-01-10 MED ORDER — SUNITINIB MALATE 37.5 MG PO CAPS: ORAL_CAPSULE | ORAL | 6 refills | Status: DC

## 2016-01-10 NOTE — Assessment & Plan Note (Addendum)
#   METASTATIC RENAL CELL CA s/p resection of LUL lung nodule; stage IV usual incurable [ right lower lobe lung nodule]. CT- RUL ~ 67m/ improving; no new nodules. Better tolerating 50 mg 1 week- on and off.   #  Recommend sutent 37.5 mg 2 week -ON and 1 week OFF given significant side effects.new script given.   # Scleroderma- currently stable on Norvasc; followed by rheumatology; discussed this could be an issue with immunotherapy.   # Pulmonary hypertension as noted on CT scan- recommend follow up with PCP re: further work up  # Smoking- recommend quitting smoking.   # hand foot syndrome- .vaseline/ hydrocortisone. discussed re: foot care/hand care; improved.   # plan to follow up in 4 weeks.  # I reviewed the blood work- with the patient in detail; also reviewed the imaging independently [as summarized above]; and with the patient in detail.

## 2016-01-10 NOTE — Progress Notes (Signed)
[place] NOTE  Patient Care Team: Leone Haven, MD as PCP - General (Family Medicine)  CHIEF COMPLAINTS/PURPOSE OF CONSULTATION:   Oncology History   # JULY 2017 METASTATIC RCC [s/p LUL lung section;Dr.Oaks]; RLL- nodule ~97m  # June 2017- ? Frontal lesion on CT [cannot have MRI]  # RIGHT KIDNEY CANCER [incidental 2012; diverticulitis] pT3a (4.3x4.3x 3.2cm) [[facility] clear cell G-2; Neg margins; May 2012 ]  # July 20th- PAZOPANIB 4 pills/day; Discontinued sec to Elevated LFTs.   # OCT 2017- Start Sunitinib 2w-On; 1 w-OFF; CT NOV 13th- RLL- Improved; no new disease.   # ? Scleroderma [Almodipine; Dr.Kernodle]     Primary cancer of right kidney with metastasis from kidney to other site ([facility]   08/30/2015 Initial Diagnosis    Primary cancer of right kidney with metastasis from kidney to other site ([facility]        HISTORY OF PRESENTING ILLNESS:  Thomas [id number].o.  male a very pleasant patient newly Diagnosed metastatic renal cell carcinoma to the lung status post resection- currently on Sutent is here for follow-up.   Since going on one week on 1 week off schedule- patient's fatigue is improved. Denies any significant worsening of the soreness of his hand and feet. Has cracks in his bilateral hands. He has been using Vaseline lotion.   He denies any shortness of breath or chest pain. Denies any cough. Denies any bone pain. He unfortunately continues to smoke.  ROS: A complete 10 point review of system is done which is negative except mentioned above in history of present illness  MEDICAL HISTORY:  Past Medical History:  Diagnosis Date  . Allergic rhinitis   . Anxiety   . Arthritis   . Asthma   . [place] bronchitis [place]   . Colitis    Had blood in his stool with this and treated in the hospital  . Colitis cystica profunda   . Colon polyps   . Complication of anesthesia    when waking up get claustophobic with mask, anxiety, panic  .  Depression   . Diverticulitis   . ED (erectile dysfunction)   . Genital herpes   . Hypertension   . Kidney disease   . Medical history non-contributory   . Occipital lymphadenopathy   . [place]ipheral vascular disease ([place]   . Renal cancer ([place]   . Shortness of breath dyspnea     SURGICAL HISTORY: Past Surgical History:  Procedure Laterality Date  . APPENDECTOMY  1966  . COLON SURGERY    . KIDNEY SURGERY Right 2012   Nephrectomy  . PARTIAL COLECTOMY  2012   For perforated colon related to diverticulitis- Dr. BMarina Gravel . VASECTOMY  2000  . VIDEO ASSISTED THORACOSCOPY (VATS)/THOROCOTOMY Left 08/13/2015   Procedure: LEFT THOROCOTOMY WITH LEFT UPPER LOBECTOMY, PREOP BRONCHOSCOPY;  Surgeon: TNestor Lewandowsky MD;  Location: [facility] ORS;  Service: General;  Laterality: Left;    SOCIAL HISTORY: Social History   Social History  . Marital status: Married    Spouse name: N/A  . Number of children: N/A  . Years of education: N/A   Occupational History  . Not on file.   Social History Main Topics  . Smoking status: Current Every Day Smoker    Packs/day: 0.25    Years: 30.00    Types: Cigarettes  . Smokeless tobacco: Never Used  . Alcohol use 3.0 oz/week    5 Cans of beer per week  Comment: 5 beers a night   . Drug use: No  . Sexual activity: Yes   Other Topics Concern  . Not on file   Social History Narrative  . No narrative on file    FAMILY HISTORY: Family History  Problem Relation Age of Onset  . Arthritis/Rheumatoid      Parent  . Heart disease      Grandparent  . Alcoholism      Other relative  . Arthritis Mother     Rhematoid    ALLERGIES:  has No Known Allergies.  MEDICATIONS:  Current Outpatient Prescriptions  Medication Sig Dispense Refill  . amLODipine (NORVASC) 2.5 MG tablet Take 1 tablet by mouth daily.    Marland Kitchen loperamide (IMODIUM A-D) 2 MG tablet Take 2 mg by mouth 4 (four) times daily as needed for diarrhea or loose stools.    . meloxicam (MOBIC) 7.5  MG tablet Take 1 tablet by mouth daily.    . SUNItinib (SUTENT) 37.5 MG capsule 2 weeks ON; 1 week OFF 14 capsule 6  . cetirizine (ZYRTEC) 10 MG tablet Take 1 tablet (10 mg total) by mouth daily. (Patient not taking: Reported on 01/10/2016)    . ibuprofen (ADVIL,MOTRIN) 200 MG tablet Take 1 tablet by mouth every 6 (six) hours as needed.    . mupirocin nasal ointment (BACTROBAN NASAL) 2 % Place 1 application into the nose 2 (two) times daily. Use one-half of tube in each nostril twice daily for five (5) days. After application, press sides of nose together and gently massage. (Patient not taking: Reported on 01/10/2016) 10 g 0   No current facility-administered medications for this visit.       Marland Kitchen  PHYSICAL EXAMINATION: ECOG PERFORMANCE STATUS: 0 - Asymptomatic  Vitals:   01/10/16 1001  BP: (!) 149/101  Pulse: 66  Resp: 20  Temp: 97.6 F (36.4 C)   Filed Weights   01/10/16 1001  Weight: 169 lb (76.7 kg)    GENERAL: Thin built moderately nourished lert, no distress and comfortable. He is alone.  He feels better.  EYES: no pallor or icterus OROPHARYNX: no thrush or ulceration; good dentition  NECK: supple, no masses felt LYMPH:  no palpable lymphadenopathy in the cervical, axillary or inguinal regions LUNGS: clear to auscultation and  No wheeze or crackles HEART/CVS: regular rate & rhythm and no murmurs; No lower extremity edema ABDOMEN: abdomen soft, non-tender and normal bowel sounds Musculoskeletal:no cyanosis of digits and no clubbing; purplish discoloration of finger tips/ toes [chronic] PSYCH: alert & oriented x 3 with fluent speech NEURO: no focal motor/sensory deficits   LABORATORY DATA:  I have reviewed the data as listed Lab Results  Component Value Date   WBC 4.0 01/10/2016   HGB 16.0 01/10/2016   HCT 47.0 01/10/2016   MCV 103.0 (H) 01/10/2016   PLT 206 01/10/2016    Recent Labs  11/01/15 1030  11/15/15 0933 11/22/15 1034 11/29/15 1115 12/27/15 1020  01/10/16 0930  NA 136  --   --   --   --  133* 137  K 4.0  --   --   --   --  4.1 4.1  CL 105  --   --   --   --  103 103  CO2 26  --   --   --   --  26 28  GLUCOSE 103*  --   --   --   --  100* 96  BUN 12  --   --   --   --  16 14  CREATININE 0.98  --   --   --   --  1.05 1.12  CALCIUM 8.4*  --   --   --   --  8.3* 8.9  GFRNONAA >60  --   --   --   --  >60 >60  GFRAA >60  --   --   --   --  >60 >60  PROT 6.7  < > 7.3 7.2 6.5 6.6 7.4  ALBUMIN 3.7  < > 4.1 4.0 3.6 3.7 4.0  AST 40  < > 64* 77* 43* 24 26  ALT 47  < > 63 74* 38 13* 14*  ALKPHOS 68  < > 71 70 57 63 61  BILITOT 0.9  < > 1.6* 1.6* 0.4 0.7 0.7  BILIDIR  --   < > 0.2 0.3 0.1  --   --   IBILI  --   < > 1.4* 1.3* 0.3  --   --   < > = values in this interval not displayed.  RADIOGRAPHIC STUDIES: I have personally reviewed the radiological images as listed and agreed with the findings in the report. Ct Chest Wo Contrast  Result Date: 01/07/2016 CLINICAL DATA:  Abnormal CT chest 07/05/2015. On chemotherapy, partial left upper lobe resection. Current smoker. EXAM: CT CHEST WITHOUT CONTRAST TECHNIQUE: Multidetector CT imaging of the chest was performed following the standard protocol without IV contrast. COMPARISON:  07/05/2015 and 10/20/2007. FINDINGS: Cardiovascular: Atherosclerotic calcification of the arterial vasculature, including three-vessel involvement of the coronary arteries. Pulmonary arteries are enlarged. Ascending aorta measures at the upper limits of normal, 3.9 cm. Heart size normal. No pericardial effusion. Mediastinum/Nodes: Mediastinal lymph nodes are not enlarged by CT size criteria. Hilar regions are difficult to definitively evaluate without IV contrast. No axillary adenopathy. Esophagus is grossly unremarkable. Lungs/Pleura: Minimal biapical pleural parenchymal scarring. Mild ill-defined centrilobular nodularity is indicative of respiratory bronchiolitis in this patient who is a current smoker. 3 mm lateral right  upper lobe nodule (series 3, image 80), decreased from 5 mm. Left upper lobectomy. No pleural fluid. Airway is unremarkable. Upper Abdomen: Low-attenuation lesions in the liver measure up to 1.4 cm, similar to 10/20/2007, indicative of benign lesions. Visualized portions of the gallbladder and right adrenal gland are unremarkable. 1.8 cm fluid density nodule in the left adrenal gland is unchanged. Right nephrectomy. Visualized portions of the left kidney, spleen, pancreas, stomach and bowel are grossly unremarkable. Upper abdominal lymph nodes are not enlarged by CT size criteria. Musculoskeletal: No worrisome lytic or sclerotic lesions. Degenerative changes are seen in the spine. Incidental note is made of a left cervical rib. Presumed thoracotomy defect in the lateral left fourth rib. Old left tenth rib fracture. IMPRESSION: 1. Interval left upper lobectomy without evidence of metastatic disease. 2. Right upper lobe nodule is slightly smaller. 3. Aortic atherosclerosis (ICD10-170.0). Coronary artery calcification. 4. Enlarged pulmonary arteries, indicative of pulmonary arterial hypertension. 5. Left adrenal adenoma. Electronically Signed   By: Lorin Picket M.D.   On: 01/07/2016 09:33    ASSESSMENT & PLAN:   Primary cancer of right kidney with metastasis from kidney to other site [facility]) # METASTATIC RENAL CELL CA s/p resection of LUL lung nodule; stage IV usual incurable [ right lower lobe lung nodule]. CT- RUL ~ 75m/ improving; no new nodules. Better tolerating 50 mg 1 week- on and off.   #  Recommend sutent 37.5 mg 2 week -ON and 1 week OFF given significant side effects.new script  given.   # Scleroderma- currently stable on Norvasc; followed by rheumatology; discussed this could be an issue with immunotherapy.   # Pulmonary hypertension as noted on CT scan- recommend follow up with PCP re: further work up  # Smoking- recommend quitting smoking.   # hand foot syndrome- .vaseline/ hydrocortisone.  discussed re: foot care/hand care; improved.   # plan to follow up in 4 weeks.  # I reviewed the blood work- with the patient in detail; also reviewed the imaging independently [as summarized above]; and with the patient in detail.      Cammie Sickle, MD 01/10/2016 2:31 PM

## 2016-01-10 NOTE — Progress Notes (Signed)
Patient here for follow up with Dr. Rogue Bussing. Patient will take his last dose of Sutent tomorrow and then start his week off on Saturday. Patient is tolerating the Sutent. He has not missed any dosing since starting back.  "I was not as fatigue and tired as I was when I was taking the Sutent straight for 2 weeks."

## 2016-02-07 ENCOUNTER — Inpatient Hospital Stay: Payer: BLUE CROSS/BLUE SHIELD | Attending: Internal Medicine | Admitting: Internal Medicine

## 2016-02-07 ENCOUNTER — Inpatient Hospital Stay: Payer: BLUE CROSS/BLUE SHIELD

## 2016-02-07 VITALS — BP 112/73 | HR 61 | Temp 97.2°F | Wt 167.2 lb

## 2016-02-07 DIAGNOSIS — M349 Systemic sclerosis, unspecified: Secondary | ICD-10-CM

## 2016-02-07 DIAGNOSIS — I272 Pulmonary hypertension, unspecified: Secondary | ICD-10-CM | POA: Insufficient documentation

## 2016-02-07 DIAGNOSIS — R911 Solitary pulmonary nodule: Secondary | ICD-10-CM | POA: Diagnosis not present

## 2016-02-07 DIAGNOSIS — Z905 Acquired absence of kidney: Secondary | ICD-10-CM | POA: Diagnosis not present

## 2016-02-07 DIAGNOSIS — C7802 Secondary malignant neoplasm of left lung: Secondary | ICD-10-CM | POA: Diagnosis not present

## 2016-02-07 DIAGNOSIS — R5383 Other fatigue: Secondary | ICD-10-CM | POA: Diagnosis not present

## 2016-02-07 DIAGNOSIS — C641 Malignant neoplasm of right kidney, except renal pelvis: Secondary | ICD-10-CM

## 2016-02-07 DIAGNOSIS — I739 Peripheral vascular disease, unspecified: Secondary | ICD-10-CM | POA: Diagnosis not present

## 2016-02-07 DIAGNOSIS — Z86010 Personal history of colonic polyps: Secondary | ICD-10-CM | POA: Diagnosis not present

## 2016-02-07 DIAGNOSIS — F17210 Nicotine dependence, cigarettes, uncomplicated: Secondary | ICD-10-CM | POA: Diagnosis not present

## 2016-02-07 DIAGNOSIS — R630 Anorexia: Secondary | ICD-10-CM | POA: Diagnosis not present

## 2016-02-07 DIAGNOSIS — Z85528 Personal history of other malignant neoplasm of kidney: Secondary | ICD-10-CM | POA: Insufficient documentation

## 2016-02-07 DIAGNOSIS — I10 Essential (primary) hypertension: Secondary | ICD-10-CM | POA: Diagnosis not present

## 2016-02-07 DIAGNOSIS — Z79899 Other long term (current) drug therapy: Secondary | ICD-10-CM | POA: Diagnosis not present

## 2016-02-07 DIAGNOSIS — L271 Localized skin eruption due to drugs and medicaments taken internally: Secondary | ICD-10-CM | POA: Diagnosis not present

## 2016-02-07 DIAGNOSIS — T733XXD Exhaustion due to excessive exertion, subsequent encounter: Secondary | ICD-10-CM

## 2016-02-07 LAB — COMPREHENSIVE METABOLIC PANEL
ALBUMIN: 3.9 g/dL (ref 3.5–5.0)
ALT: 13 U/L — ABNORMAL LOW (ref 17–63)
ANION GAP: 5 (ref 5–15)
AST: 24 U/L (ref 15–41)
Alkaline Phosphatase: 64 U/L (ref 38–126)
BUN: 17 mg/dL (ref 6–20)
CHLORIDE: 104 mmol/L (ref 101–111)
CO2: 28 mmol/L (ref 22–32)
Calcium: 8.7 mg/dL — ABNORMAL LOW (ref 8.9–10.3)
Creatinine, Ser: 1.11 mg/dL (ref 0.61–1.24)
GFR calc Af Amer: >60 mL/min (ref >60)
Glucose, Bld: 98 mg/dL (ref 65–99)
POTASSIUM: 4.1 mmol/L (ref 3.5–5.1)
Sodium: 137 mmol/L (ref 135–145)
Total Bilirubin: 0.8 mg/dL (ref 0.3–1.2)
Total Protein: 6.9 g/dL (ref 6.5–8.1)

## 2016-02-07 LAB — CBC WITH DIFFERENTIAL/PLATELET
BASOS ABS: 0 10*3/uL (ref 0–0.1)
BASOS PCT: 1 %
EOS PCT: 3 %
Eosinophils Absolute: 0.1 10*3/uL (ref 0–0.7)
HCT: 46 % (ref 40.0–52.0)
Hemoglobin: 15.5 g/dL (ref 13.0–18.0)
Lymphocytes Relative: 34 %
Lymphs Abs: 1.5 10*3/uL (ref 1.0–3.6)
MCH: 35 pg — ABNORMAL HIGH (ref 26.0–34.0)
MCHC: 33.7 g/dL (ref 32.0–36.0)
MCV: 104 fL — AB (ref 80.0–100.0)
MONO ABS: 0.4 10*3/uL (ref 0.2–1.0)
Monocytes Relative: 9 %
Neutro Abs: 2.4 10*3/uL (ref 1.4–6.5)
Neutrophils Relative %: 53 %
PLATELETS: 165 10*3/uL (ref 150–440)
RBC: 4.42 MIL/uL (ref 4.40–5.90)
RDW: 15.6 % — AB (ref 11.5–14.5)
WBC: 4.5 10*3/uL (ref 3.8–10.6)

## 2016-02-07 NOTE — Progress Notes (Signed)
[place] NOTE  Patient Care Team: Leone Haven, MD as PCP - General (Family Medicine)  CHIEF COMPLAINTS/PURPOSE OF CONSULTATION:   Oncology History   # JULY 2017 METASTATIC RCC [s/p LUL lung section;Dr.Oaks]; RLL- nodule ~64m  # June 2017- ? Frontal lesion on CT [cannot have MRI]  # RIGHT KIDNEY CANCER [incidental 2012; diverticulitis] pT3a (4.3x4.3x 3.2cm) [[facility] clear cell G-2; Neg margins; May 2012 ]  # July 20th- PAZOPANIB 4 pills/day; Discontinued sec to Elevated LFTs.   # OCT 2017- Start Sunitinib 2w-On; 1 w-OFF; CT NOV 13th- RLL- Improved; no new disease.   # ? Scleroderma [Almodipine; Dr.Kernodle]     Primary cancer of right kidney with metastasis from kidney to other site ([facility]   08/30/2015 Initial Diagnosis    Primary cancer of right kidney with metastasis from kidney to other site ([facility]        HISTORY OF PRESENTING ILLNESS:  MMCADOO [id number].o.  male a very pleasant patient newly Diagnosed metastatic renal cell carcinoma to the lung status post resection- currently on Sutent is here for follow-up. Patient is currently on 2 weeks on; 1 week off schedule because of significant fatigue.  He also has been evaluated by rheumatology- increase the dose of amlodipine; and also awaiting further workup for his pulmonary hypertension.  Patient notes to have worsening fatigue towards the 12th through the 14th day of treatment.  Denies any significant worsening of the soreness of his hand and feet. Has cracks in his bilateral hands. He has been using Vaseline lotion.   He denies any shortness of breath or chest pain. Denies any cough. Denies any bone pain. He unfortunately continues to smoke. Denies any headaches.  ROS: A complete 10 point review of system is done which is negative except mentioned above in history of present illness  MEDICAL HISTORY:  Past Medical History:  Diagnosis Date  . Allergic rhinitis   . Anxiety   . Arthritis   .  Asthma   . [place]   . Colitis    Had blood in his stool with this and treated in the hospital  . Colitis cystica profunda   . Colon polyps   . Complication of anesthesia    when waking up get claustophobic with mask, anxiety, panic  . Depression   . Diverticulitis   . ED (erectile dysfunction)   . Genital herpes   . Hypertension   . Kidney disease   . Medical history non-contributory   . Occipital lymphadenopathy   . Peripheral vascular disease ([place]   . [place]al cancer ([place]   . Shortness of breath dyspnea     SURGICAL HISTORY: Past Surgical History:  Procedure Laterality Date  . APPENDECTOMY  1966  . COLON SURGERY    . KIDNEY SURGERY Right 2012   Nephrectomy  . PARTIAL COLECTOMY  2012   For perforated colon related to diverticulitis- Dr. BMarina Gravel . VASECTOMY  2000  . VIDEO ASSISTED THORACOSCOPY (VATS)/THOROCOTOMY Left 08/13/2015   Procedure: LEFT THOROCOTOMY WITH LEFT UPPER LOBECTOMY, PREOP BRONCHOSCOPY;  Surgeon: TNestor Lewandowsky MD;  Location: [facility] ORS;  Service: General;  Laterality: Left;    SOCIAL HISTORY: Social History   Social History  . Marital status: Married    Spouse name: N/A  . Number of children: N/A  . Years of education: N/A   Occupational History  . Not on file.   Social History Main Topics  . Smoking status: Current Every Day  Smoker    Packs/day: 0.25    Years: 30.00    Types: Cigarettes  . Smokeless tobacco: Never Used  . Alcohol use 3.0 oz/week    5 Cans of beer per week     Comment: 5 beers a night   . Drug use: No  . Sexual activity: Yes   Other Topics Concern  . Not on file   Social History Narrative  . No narrative on file    FAMILY HISTORY: Family History  Problem Relation Age of Onset  . Arthritis/Rheumatoid      Parent  . Heart disease      Grandparent  . Alcoholism      Other relative  . Arthritis Mother     Rhematoid    ALLERGIES:  has No Known Allergies.  MEDICATIONS:  Current Outpatient  Prescriptions  Medication Sig Dispense Refill  . amLODipine (NORVASC) 5 MG tablet Take by mouth.    Marland Kitchen ibuprofen (ADVIL,MOTRIN) 200 MG tablet Take 1 tablet by mouth every 6 (six) hours as needed.    . loperamide (IMODIUM A-D) 2 MG tablet Take 2 mg by mouth 4 (four) times daily as needed for diarrhea or loose stools.    . meloxicam (MOBIC) 7.5 MG tablet Take 1 tablet by mouth daily.    . SUNItinib (SUTENT) 37.5 MG capsule 2 weeks ON; 1 week OFF 14 capsule 6   No current facility-administered medications for this visit.       Marland Kitchen  PHYSICAL EXAMINATION: ECOG PERFORMANCE STATUS: 0 - Asymptomatic  Vitals:   02/07/16 1105  BP: 112/73  Pulse: 61  Temp: 97.2 F (36.2 C)   Filed Weights   02/07/16 1105  Weight: 167 lb 4 oz (75.9 kg)    GENERAL: Thin built moderately nourished lert, no distress and comfortable. He is alone.  He feels better.  EYES: no pallor or icterus OROPHARYNX: no thrush or ulceration; good dentition  NECK: supple, no masses felt LYMPH:  no palpable lymphadenopathy in the cervical, axillary or inguinal regions LUNGS: clear to auscultation and  No wheeze or crackles HEART/CVS: regular rate & rhythm and no murmurs; No lower extremity edema ABDOMEN: abdomen soft, non-tender and normal bowel sounds Musculoskeletal:no cyanosis of digits and no clubbing; purplish discoloration of finger tips/ toes [chronic] PSYCH: alert & oriented x 3 with fluent speech NEURO: no focal motor/sensory deficits   LABORATORY DATA:  I have reviewed the data as listed Lab Results  Component Value Date   WBC 4.5 02/07/2016   HGB 15.5 02/07/2016   HCT 46.0 02/07/2016   MCV 104.0 (H) 02/07/2016   PLT 165 02/07/2016    Recent Labs  11/15/15 0933 11/22/15 1034 11/29/15 1115 12/27/15 1020 01/10/16 0930 02/07/16 1000  NA  --   --   --  133* 137 137  K  --   --   --  4.1 4.1 4.1  CL  --   --   --  103 103 104  CO2  --   --   --  '26 28 28  '$ GLUCOSE  --   --   --  100* 96 98  BUN  --    --   --  '16 14 17  '$ CREATININE  --   --   --  1.05 1.12 1.11  CALCIUM  --   --   --  8.3* 8.9 8.7*  GFRNONAA  --   --   --  >60 >60 >60  GFRAA  --   --   --  >  60 >60 >60  PROT 7.3 7.2 6.5 6.6 7.4 6.9  ALBUMIN 4.1 4.0 3.6 3.7 4.0 3.9  AST 64* 77* 43* '24 26 24  '$ ALT 63 74* 38 13* 14* 13*  ALKPHOS 71 70 57 63 61 64  BILITOT 1.6* 1.6* 0.4 0.7 0.7 0.8  BILIDIR 0.2 0.3 0.1  --   --   --   IBILI 1.4* 1.3* 0.3  --   --   --     RADIOGRAPHIC STUDIES: I have personally reviewed the radiological images as listed and agreed with the findings in the report. No results found.  ASSESSMENT & PLAN:   Primary cancer of right kidney with metastasis from kidney to other site [facility]) # METASTATIC RENAL CELL CA s/p resection of LUL lung nodule; stage IV [ right lower lobe lung nodule]. CT- RUL ~ 43m/ improving; no new nodules. Currently on  sutent 37.5 mg 2 week -ON and 1 week OFF;   # still feeling tired on medication- recommend continue current dose/schedule. Discussed with the patient that if he feels significant fatigue towards the end of the treatment- to stop taking pills.   # Scleroderma- increased to 5 mg Norvasc; followed by rheumatology; potential problem with immunotherapy.   # Pulmonary hypertension as noted on CT scan- awaiting further work up with Rheumatology.   # Poor appetite/poor taste- recommend dietary referral.  # Smoking- recommend quitting smoking; states working to cutting down; states patches- nightmares.   # hand foot syndrome- continue vaseline/ hydrocortisone. discussed re: foot care/hand care; improved.   # plan to follow up in 4 weeks/labs.      GCammie Sickle MD 02/08/2016 8:24 AM

## 2016-02-07 NOTE — Assessment & Plan Note (Addendum)
#   METASTATIC RENAL CELL CA s/p resection of LUL lung nodule; stage IV [ right lower lobe lung nodule]. CT- RUL ~ 52m/ improving; no new nodules. Currently on  sutent 37.5 mg 2 week -ON and 1 week OFF;   # still feeling tired on medication- recommend continue current dose/schedule. Discussed with the patient that if he feels significant fatigue towards the end of the treatment- to stop taking pills.   # Scleroderma- increased to 5 mg Norvasc; followed by rheumatology; potential problem with immunotherapy.   # Pulmonary hypertension as noted on CT scan- awaiting further work up with Rheumatology.   # Poor appetite/poor taste- recommend dietary referral.  # Smoking- recommend quitting smoking; states working to cutting down; states patches- nightmares.   # hand foot syndrome- continue vaseline/ hydrocortisone. discussed re: foot care/hand care; improved.   # plan to follow up in 4 weeks/labs.

## 2016-02-07 NOTE — Progress Notes (Signed)
Patient here today for follow up on Primary cancer of right kidney with metastasis from kidney to other site.

## 2016-02-11 ENCOUNTER — Inpatient Hospital Stay: Payer: BLUE CROSS/BLUE SHIELD

## 2016-02-11 NOTE — Progress Notes (Signed)
Nutrition Assessment   Reason for Assessment:   Referral from Dr Rogue Bussing for poor appetite and taste changes.    ASSESSMENT:  60 year old male with right kidney cancer with mets to lung s/p resection.   Past medical history of chronic bronchitis, colitis, diverticulitis, PVD, HTN, hand/foot syndrome.  Patient reports good appetite but has not eaten anything today so far. Report typically does not eat more than 1 to 2 meals per day.  Wife joins patient today and reports wife is an excellent cook, prepares a wide variety of meals (meats, vegetables, fruit). Reports that he has access to food.  Reports some loss of taste while taking sutent, some nausea but nothing consistent.      Medications: sutent, imodium  Labs: reviewed  Anthropometrics:   Height: 73 inches Weight: 167 lb  BMI: 22  Reports that he has gained 6 pounds recently after being at 161 pounds for the last 5 years   Estimated Energy Needs  Kcals: 2100-2500 kcals/d Protein: 91-114 g/d Fluid: 2.5 L/d  NUTRITION DIAGNOSIS: none at this time    INTERVENTION:   Discussed importance of not skipping meals and importance of nutrition. Discussed option of oral nutrition supplement for breakfast as patient does not like to eat breakfast.  Reports that they were advised against supplements while on previous chemo drug but currently not taking the drug. Discussed that oral nutrition supplements do NOT contain (ie ensure/boost) 100% Daily Value of most vitamins and minerals and can be consumed.   Discussed and provided handout on strategies when having taste changes.  Patient verbalized understanding.   Wife with question regarding sugar and cancer.  Discussed the research.     MONITORING, EVALUATION, GOAL: Patient to continue to consume adequate calories and protein to maintain/gain weight.    NEXT VISIT: as needed  Renny Remer B. Zenia Resides, [place], [place] (pager)

## 2016-03-07 ENCOUNTER — Inpatient Hospital Stay: Payer: BLUE CROSS/BLUE SHIELD | Attending: Internal Medicine

## 2016-03-07 ENCOUNTER — Inpatient Hospital Stay (HOSPITAL_BASED_OUTPATIENT_CLINIC_OR_DEPARTMENT_OTHER): Payer: BLUE CROSS/BLUE SHIELD | Admitting: Internal Medicine

## 2016-03-07 VITALS — BP 120/73 | HR 53 | Temp 96.8°F | Wt 167.1 lb

## 2016-03-07 DIAGNOSIS — M349 Systemic sclerosis, unspecified: Secondary | ICD-10-CM

## 2016-03-07 DIAGNOSIS — L271 Localized skin eruption due to drugs and medicaments taken internally: Secondary | ICD-10-CM

## 2016-03-07 DIAGNOSIS — Z79899 Other long term (current) drug therapy: Secondary | ICD-10-CM

## 2016-03-07 DIAGNOSIS — Z85528 Personal history of other malignant neoplasm of kidney: Secondary | ICD-10-CM

## 2016-03-07 DIAGNOSIS — C641 Malignant neoplasm of right kidney, except renal pelvis: Secondary | ICD-10-CM

## 2016-03-07 DIAGNOSIS — Z905 Acquired absence of kidney: Secondary | ICD-10-CM | POA: Insufficient documentation

## 2016-03-07 DIAGNOSIS — R5383 Other fatigue: Secondary | ICD-10-CM | POA: Insufficient documentation

## 2016-03-07 DIAGNOSIS — I739 Peripheral vascular disease, unspecified: Secondary | ICD-10-CM | POA: Insufficient documentation

## 2016-03-07 DIAGNOSIS — C7802 Secondary malignant neoplasm of left lung: Secondary | ICD-10-CM

## 2016-03-07 DIAGNOSIS — T733XXD Exhaustion due to excessive exertion, subsequent encounter: Secondary | ICD-10-CM

## 2016-03-07 DIAGNOSIS — F17210 Nicotine dependence, cigarettes, uncomplicated: Secondary | ICD-10-CM

## 2016-03-07 DIAGNOSIS — I272 Pulmonary hypertension, unspecified: Secondary | ICD-10-CM | POA: Insufficient documentation

## 2016-03-07 LAB — COMPREHENSIVE METABOLIC PANEL
ALBUMIN: 4.1 g/dL (ref 3.5–5.0)
ALT: 21 U/L (ref 17–63)
ANION GAP: 5 (ref 5–15)
AST: 30 U/L (ref 15–41)
Alkaline Phosphatase: 50 U/L (ref 38–126)
BILIRUBIN TOTAL: 0.7 mg/dL (ref 0.3–1.2)
BUN: 19 mg/dL (ref 6–20)
CO2: 27 mmol/L (ref 22–32)
Calcium: 9 mg/dL (ref 8.9–10.3)
Chloride: 106 mmol/L (ref 101–111)
Creatinine, Ser: 1.22 mg/dL (ref 0.61–1.24)
GFR calc non Af Amer: >60 mL/min (ref >60)
GLUCOSE: 91 mg/dL (ref 65–99)
POTASSIUM: 4.7 mmol/L (ref 3.5–5.1)
SODIUM: 138 mmol/L (ref 135–145)
TOTAL PROTEIN: 7.1 g/dL (ref 6.5–8.1)

## 2016-03-07 LAB — CBC WITH DIFFERENTIAL/PLATELET
BASOS PCT: 1 %
Basophils Absolute: 0 10*3/uL (ref 0–0.1)
EOS ABS: 0.2 10*3/uL (ref 0–0.7)
Eosinophils Relative: 4 %
HCT: 43.5 % (ref 40.0–52.0)
Hemoglobin: 14.7 g/dL (ref 13.0–18.0)
Lymphocytes Relative: 31 %
Lymphs Abs: 1.4 10*3/uL (ref 1.0–3.6)
MCH: 35.6 pg — AB (ref 26.0–34.0)
MCHC: 33.8 g/dL (ref 32.0–36.0)
MCV: 105.3 fL — ABNORMAL HIGH (ref 80.0–100.0)
MONOS PCT: 11 %
Monocytes Absolute: 0.5 10*3/uL (ref 0.2–1.0)
Neutro Abs: 2.4 10*3/uL (ref 1.4–6.5)
Neutrophils Relative %: 53 %
Platelets: 181 10*3/uL (ref 150–440)
RBC: 4.13 MIL/uL — ABNORMAL LOW (ref 4.40–5.90)
RDW: 16.1 % — AB (ref 11.5–14.5)
WBC: 4.4 10*3/uL (ref 3.8–10.6)

## 2016-03-07 NOTE — Progress Notes (Signed)
[place] NOTE  Patient Care Team: Leone Haven, MD as PCP - General (Family Medicine)  CHIEF COMPLAINTS/PURPOSE OF CONSULTATION:   Oncology History   # JULY 2017 METASTATIC RCC [s/p LUL lung section;Dr.Oaks]; RLL- nodule ~28m  # June 2017- ? Frontal lesion on CT [cannot have MRI]  # RIGHT KIDNEY CANCER [incidental 2012; diverticulitis] pT3a (4.3x4.3x 3.2cm) [[facility] clear cell G-2; Neg margins; May 2012 ]  # July 20th- PAZOPANIB 4 pills/day; Discontinued sec to Elevated LFTs.   # OCT 2017- Start Sunitinib 2w-On; 1 w-OFF; CT NOV 13th- RLL- Improved; no new disease.   # ? Scleroderma [Almodipine; Dr.Kernodle]     Primary cancer of right kidney with metastasis from kidney to other site ([facility]   08/30/2015 Initial Diagnosis    Primary cancer of right kidney with metastasis from kidney to other site ([facility]        HISTORY OF PRESENTING ILLNESS:  Thomas SCHNELLER89y.o.  male a very pleasant patient newly Diagnosed metastatic renal cell carcinoma to the lung status post resection- currently on Sutent is here for follow-up. Patient is currently on 2 weeks on; 1 week off schedule because of significant fatigue.  He recently had a PFT done; and also awaiting a 2-D echo- workup of his pulmonary hypertension.  He continues to complain of  worsening fatigue towards the 12th through the 14th day of treatment. Last cycle he took for about 12 days of treatment.  Denies any significant worsening of the soreness of his hand and feet. Has cracks in his bilateral hands; this has improved since using Vaseline cream. Denies any cough. Denies any bone pain. He unfortunately continues to smoke. Denies any headaches.  ROS: A complete 10 point review of system is done which is negative except mentioned above in history of present illness  MEDICAL HISTORY:  Past Medical History:  Diagnosis Date  . Allergic rhinitis   . Anxiety   . Arthritis   . Asthma   . Chronic bronchitis  ([place]   . Colitis    Had blood in his stool with this and treated in the hospital  . Colitis cystica profunda   . Colon polyps   . Complication of anesthesia    when waking up get claustophobic with mask, anxiety, panic  . Depression   . Diverticulitis   . ED (erectile dysfunction)   . Genital herpes   . Hypertension   . Kidney disease   . Medical history non-contributory   . Occipital lymphadenopathy   . [place]ipheral vascular disease ([place]   . Renal cancer ([place]   . Shortness of breath dyspnea     SURGICAL HISTORY: Past Surgical History:  Procedure Laterality Date  . APPENDECTOMY  1966  . COLON SURGERY    . KIDNEY SURGERY Right 2012   Nephrectomy  . PARTIAL COLECTOMY  2012   For perforated colon related to diverticulitis- Dr. BMarina Gravel . VASECTOMY  2000  . VIDEO ASSISTED THORACOSCOPY (VATS)/THOROCOTOMY Left 08/13/2015   Procedure: LEFT THOROCOTOMY WITH LEFT UPPER LOBECTOMY, PREOP BRONCHOSCOPY;  Surgeon: TNestor Lewandowsky MD;  Location: [facility] ORS;  Service: General;  Laterality: Left;    SOCIAL HISTORY: Social History   Social History  . Marital status: Married    Spouse name: N/A  . Number of children: N/A  . Years of education: N/A   Occupational History  . Not on file.   Social History Main Topics  . Smoking status: Current Every Day Smoker  Packs/day: 0.25    Years: 30.00    Types: Cigarettes  . Smokeless tobacco: Never Used  . Alcohol use 3.0 oz/week    5 Cans of beer per week     Comment: 5 beers a night   . Drug use: No  . Sexual activity: Yes   Other Topics Concern  . Not on file   Social History Narrative  . No narrative on file    FAMILY HISTORY: Family History  Problem Relation Age of Onset  . Arthritis/Rheumatoid      Parent  . Heart disease      Grandparent  . Alcoholism      Other relative  . Arthritis Mother     Rhematoid    ALLERGIES:  has No Known Allergies.  MEDICATIONS:  Current Outpatient Prescriptions  Medication Sig  Dispense Refill  . amLODipine (NORVASC) 5 MG tablet Take by mouth.    Marland Kitchen ibuprofen (ADVIL,MOTRIN) 200 MG tablet Take 1 tablet by mouth every 6 (six) hours as needed.    . loperamide (IMODIUM A-D) 2 MG tablet Take 2 mg by mouth 4 (four) times daily as needed for diarrhea or loose stools.    . meloxicam (MOBIC) 7.5 MG tablet Take 1 tablet by mouth daily.    Marland Kitchen omeprazole (PRILOSEC) 20 MG capsule Take 20 mg by mouth daily as needed.    . SUNItinib (SUTENT) 37.5 MG capsule 2 weeks ON; 1 week OFF 14 capsule 6   No current facility-administered medications for this visit.       Marland Kitchen  PHYSICAL EXAMINATION: ECOG PERFORMANCE STATUS: 0 - Asymptomatic  Vitals:   03/07/16 1006  BP: 120/73  Pulse: (!) 53  Temp: (!) 96.8 F (36 C)   Filed Weights   03/07/16 1006  Weight: 167 lb 2 oz (75.8 kg)    GENERAL: Thin built moderately nourished lert, no distress and comfortable. He is alone.  He feels better.  EYES: no pallor or icterus OROPHARYNX: no thrush or ulceration; good dentition  NECK: supple, no masses felt LYMPH:  no palpable lymphadenopathy in the cervical, axillary or inguinal regions LUNGS: clear to auscultation and  No wheeze or crackles HEART/CVS: regular rate & rhythm and no murmurs; No lower extremity edema ABDOMEN: abdomen soft, non-tender and normal bowel sounds Musculoskeletal:no cyanosis of digits and no clubbing; purplish discoloration of finger tips/ toes [chronic] PSYCH: alert & oriented x 3 with fluent speech NEURO: no focal motor/sensory deficits   LABORATORY DATA:  I have reviewed the data as listed Lab Results  Component Value Date   WBC 4.4 03/07/2016   HGB 14.7 03/07/2016   HCT 43.5 03/07/2016   MCV 105.3 (H) 03/07/2016   PLT 181 03/07/2016    Recent Labs  11/15/15 0933 11/22/15 1034 11/29/15 1115  01/10/16 0930 02/07/16 1000 03/07/16 0940  NA  --   --   --   < > 137 137 138  K  --   --   --   < > 4.1 4.1 4.7  CL  --   --   --   < > 103 104 106  CO2   --   --   --   < > _0 GLUCOSE  --   --   --   < > 96 98 91  BUN  --   --   --   < > _1 CREATININE  --   --   --   < >  1.12 1.11 1.22  CALCIUM  --   --   --   < > 8.9 8.7* 9.0  GFRNONAA  --   --   --   < > >60 >60 >60  GFRAA  --   --   --   < > >60 >60 >60  PROT 7.3 7.2 6.5  < > 7.4 6.9 7.1  ALBUMIN 4.1 4.0 3.6  < > 4.0 3.9 4.1  AST 64* 77* 43*  < > _0 ALT 63 74* 38  < > 14* 13* 21  ALKPHOS 71 70 57  < > 61 64 50  BILITOT 1.6* 1.6* 0.4  < > 0.7 0.8 0.7  BILIDIR 0.2 0.3 0.1  --   --   --   --   IBILI 1.4* 1.3* 0.3  --   --   --   --   < > = values in this interval not displayed.  RADIOGRAPHIC STUDIES: I have personally reviewed the radiological images as listed and agreed with the findings in the report. No results found.  ASSESSMENT & PLAN:   Primary cancer of right kidney with metastasis from kidney to other site [facility]) # METASTATIC RENAL CELL CA s/p resection of LUL lung nodule; stage IV [ right lower lobe lung nodule]. NOV 2017- CT- RUL ~ 110m/ improving; no new nodules. Currently on  sutent 37.5 mg 2 week -ON and 1 week OFF;   # still feeling tired on medication- recommend continue current dose/schedule; also discussed multiple other options- including other TKI; MET-inihibitor.  Opdivo BUT potential problem with scleroderma. Also discussed re: surveillance.   # Scleroderma- increased to 5 mg Norvasc; followed by rheumatology;  # Pulmonary hypertension as noted on CT scan-awaiting 2 d echo; PFT- "okay"- follow up with Dr.Fleming.  # Smoking- states he is cutting down.   # hand foot syndrome- continue vaseline/ hydrocortisone. discussed re: foot care/hand care; improved.   # plan to follow up in 4 weeks/labs.      GCammie Sickle MD 03/07/2016 5:03 PM

## 2016-03-07 NOTE — Progress Notes (Signed)
Patient here today for follow up.  Patient states dark colored urine since started treatment several months ago

## 2016-03-07 NOTE — Assessment & Plan Note (Addendum)
#  METASTATIC RENAL CELL CA s/p resection of LUL lung nodule; stage IV [ right lower lobe lung nodule]. NOV 2017- CT- RUL ~ 42m/ improving; no new nodules. Currently on  sutent 37.5 mg 2 week -ON and 1 week OFF;   # still feeling tired on medication- recommend continue current dose/schedule; also discussed multiple other options- including other TKI; MET-inihibitor.  Opdivo BUT potential problem with scleroderma. Also discussed re: surveillance.   # Scleroderma- increased to 5 mg Norvasc; followed by rheumatology;  # Pulmonary hypertension as noted on CT scan-awaiting 2 d echo; PFT- "okay"- follow up with Dr.Fleming.  # Smoking- states he is cutting down.   # hand foot syndrome- continue vaseline/ hydrocortisone. discussed re: foot care/hand care; improved.   # plan to follow up in 4 weeks/labs.

## 2016-03-08 LAB — THYROID PANEL WITH TSH
Free Thyroxine Index: 1.2 (ref 1.2–4.9)
T3 Uptake Ratio: 26 % (ref 24–39)
T4, Total: 4.8 ug/dL (ref 4.5–12.0)
TSH: 3.05 u[IU]/mL (ref 0.450–4.500)

## 2016-03-24 ENCOUNTER — Telehealth: Payer: Self-pay | Admitting: *Deleted

## 2016-03-24 NOTE — Telephone Encounter (Signed)
Patient left vm stating that he is unable to reach someone at [employer] to schedule medication delivery.    I contacted [employer] to see if there was anything on our end that was needed.  Representative stated that they have left messages for the patient to contact them to schedule the delivery.    Called pt, Patient stated that he has called [employer] 32 times last week, leaving vm asking for them to return his call, with no response back. Patient was very irritated that he has to call [employer] every month to have the medication scheduled for delivery as someone is at his home all day everyday.  I advised pt that it took me about 4 minutes to get a representative on the phone and suggested that he try to call now, confirmed that pt was calling the correct phone number.

## 2016-03-31 ENCOUNTER — Other Ambulatory Visit: Payer: BLUE CROSS/BLUE SHIELD

## 2016-03-31 ENCOUNTER — Ambulatory Visit: Payer: BLUE CROSS/BLUE SHIELD | Admitting: Internal Medicine

## 2016-04-04 ENCOUNTER — Inpatient Hospital Stay (HOSPITAL_BASED_OUTPATIENT_CLINIC_OR_DEPARTMENT_OTHER): Payer: BLUE CROSS/BLUE SHIELD | Admitting: Internal Medicine

## 2016-04-04 ENCOUNTER — Inpatient Hospital Stay: Payer: BLUE CROSS/BLUE SHIELD | Attending: Internal Medicine

## 2016-04-04 VITALS — BP 130/80 | HR 60 | Temp 96.0°F | Wt 163.5 lb

## 2016-04-04 DIAGNOSIS — L271 Localized skin eruption due to drugs and medicaments taken internally: Secondary | ICD-10-CM | POA: Insufficient documentation

## 2016-04-04 DIAGNOSIS — I739 Peripheral vascular disease, unspecified: Secondary | ICD-10-CM

## 2016-04-04 DIAGNOSIS — I272 Pulmonary hypertension, unspecified: Secondary | ICD-10-CM

## 2016-04-04 DIAGNOSIS — Z79899 Other long term (current) drug therapy: Secondary | ICD-10-CM | POA: Insufficient documentation

## 2016-04-04 DIAGNOSIS — F17210 Nicotine dependence, cigarettes, uncomplicated: Secondary | ICD-10-CM

## 2016-04-04 DIAGNOSIS — I10 Essential (primary) hypertension: Secondary | ICD-10-CM | POA: Diagnosis not present

## 2016-04-04 DIAGNOSIS — Z905 Acquired absence of kidney: Secondary | ICD-10-CM | POA: Diagnosis not present

## 2016-04-04 DIAGNOSIS — Z85528 Personal history of other malignant neoplasm of kidney: Secondary | ICD-10-CM

## 2016-04-04 DIAGNOSIS — C7802 Secondary malignant neoplasm of left lung: Secondary | ICD-10-CM

## 2016-04-04 DIAGNOSIS — R918 Other nonspecific abnormal finding of lung field: Secondary | ICD-10-CM

## 2016-04-04 DIAGNOSIS — M349 Systemic sclerosis, unspecified: Secondary | ICD-10-CM | POA: Insufficient documentation

## 2016-04-04 DIAGNOSIS — C641 Malignant neoplasm of right kidney, except renal pelvis: Secondary | ICD-10-CM

## 2016-04-04 LAB — COMPREHENSIVE METABOLIC PANEL
ALBUMIN: 4.3 g/dL (ref 3.5–5.0)
ALT: 17 U/L (ref 17–63)
ANION GAP: 8 (ref 5–15)
AST: 28 U/L (ref 15–41)
Alkaline Phosphatase: 58 U/L (ref 38–126)
BUN: 16 mg/dL (ref 6–20)
CO2: 25 mmol/L (ref 22–32)
Calcium: 9.1 mg/dL (ref 8.9–10.3)
Chloride: 104 mmol/L (ref 101–111)
Creatinine, Ser: 1.12 mg/dL (ref 0.61–1.24)
GFR calc non Af Amer: >60 mL/min (ref >60)
GLUCOSE: 94 mg/dL (ref 65–99)
POTASSIUM: 4.7 mmol/L (ref 3.5–5.1)
SODIUM: 137 mmol/L (ref 135–145)
TOTAL PROTEIN: 7.6 g/dL (ref 6.5–8.1)
Total Bilirubin: 0.8 mg/dL (ref 0.3–1.2)

## 2016-04-04 LAB — CBC WITH DIFFERENTIAL/PLATELET
BASOS PCT: 1 %
Basophils Absolute: 0 10*3/uL (ref 0–0.1)
EOS ABS: 0.1 10*3/uL (ref 0–0.7)
EOS PCT: 3 %
HCT: 44.8 % (ref 40.0–52.0)
Hemoglobin: 15.5 g/dL (ref 13.0–18.0)
Lymphocytes Relative: 37 %
Lymphs Abs: 1.6 10*3/uL (ref 1.0–3.6)
MCH: 36.6 pg — AB (ref 26.0–34.0)
MCHC: 34.6 g/dL (ref 32.0–36.0)
MCV: 106 fL — ABNORMAL HIGH (ref 80.0–100.0)
MONO ABS: 0.3 10*3/uL (ref 0.2–1.0)
MONOS PCT: 7 %
NEUTROS PCT: 52 %
Neutro Abs: 2.2 10*3/uL (ref 1.4–6.5)
Platelets: 221 10*3/uL (ref 150–440)
RBC: 4.23 MIL/uL — ABNORMAL LOW (ref 4.40–5.90)
RDW: 16 % — AB (ref 11.5–14.5)
WBC: 4.2 10*3/uL (ref 3.8–10.6)

## 2016-04-04 LAB — TSH: TSH: 3.251 u[IU]/mL (ref 0.350–4.500)

## 2016-04-04 NOTE — Assessment & Plan Note (Addendum)
#   METASTATIC RENAL CELL CA s/p resection of LUL lung nodule; stage IV [ right lower lobe lung nodule]. NOV 13th  2017- CT- RUL ~ 73m/ improving; no new nodules. Currently on  sutent 37.5 mg 2 week -ON and 1 week OFF; Tolerating better except for mod-fatigue.   # Continue sunitinib at the current dose/schedule. Discussed fatigue continues to worsen; no other reason found- other options are available.  # Scleroderma-/stable to 5 mg Norvasc; followed by rheumatology; contraindication to immunotherapy  # Pulmonary hypertension as noted on CT scan 2 d echo- RVP- pressure 38/slightly higher; ; PFT- "okay"; awaiting sleep study [Dr.Fleming].   # Smoking- states he is cutting down.   # hand foot syndrome- continue vaseline/ hydrocortisone.re: foot care/hand care; improved.   # plan to follow up in 4 weeks/labs; CT scan prior.

## 2016-04-04 NOTE — Progress Notes (Signed)
Patient here today for follow up.   

## 2016-04-04 NOTE — Progress Notes (Signed)
[place] NOTE  Patient Care Team: Leone Haven, MD as PCP - General (Family Medicine)  CHIEF COMPLAINTS/PURPOSE OF CONSULTATION:   Oncology History   # JULY 2017 METASTATIC RCC [s/p LUL lung section;Dr.Oaks]; RLL- nodule ~13m  # June 2017- ? Frontal lesion on CT [cannot have MRI]  # RIGHT KIDNEY CANCER [incidental 2012; diverticulitis] pT3a (4.3x4.3x 3.2cm) [[facility] clear cell G-2; Neg margins; May 2012 ]  # July 20th- PAZOPANIB 4 pills/day; Discontinued sec to Elevated LFTs.   # OCT 2017- Start Sunitinib 2w-On; 1 w-OFF; CT NOV 13th- RLL- Improved; no new disease.   # ? Scleroderma [Almodipine; Dr.Kernodle]     Primary cancer of right kidney with metastasis from kidney to other site ([facility]   08/30/2015 Initial Diagnosis    Primary cancer of right kidney with metastasis from kidney to other site ([facility]        HISTORY OF PRESENTING ILLNESS:  Thomas CIRELLI89y.o.  male a very pleasant patient newly Diagnosed metastatic renal cell carcinoma to the lung status post resection- currently on Sutent is here for follow-up. Patient is currently on 2 weeks on; 1 week off schedule because of significant fatigue.  Patient denies having significant fatigue at this time. Energy levels are improving. He also notes having improved skin reaction from Sutent. However has been very aggressive with Vaseline and hydrocortisone. Denies any cough. Denies any bone pain. He unfortunately continues to smoke. Denies any headaches.  ROS: A complete 10 point review of system is done which is negative except mentioned above in history of present illness  MEDICAL HISTORY:  Past Medical History:  Diagnosis Date  . Allergic rhinitis   . Anxiety   . Arthritis   . Asthma   . Chronic bronchitis ([place]   . Colitis    Had blood in his stool with this and treated in the hospital  . Colitis cystica profunda   . Colon polyps   . Complication of anesthesia    when waking up get  claustophobic with mask, anxiety, panic  . Depression   . Diverticulitis   . ED (erectile dysfunction)   . Genital herpes   . Hypertension   . Kidney disease   . Medical history non-contributory   . Occipital lymphadenopathy   . [place]ipheral vascular disease ([place]   . Renal cancer ([place]   . Shortness of breath dyspnea     SURGICAL HISTORY: Past Surgical History:  Procedure Laterality Date  . APPENDECTOMY  1966  . COLON SURGERY    . KIDNEY SURGERY Right 2012   Nephrectomy  . PARTIAL COLECTOMY  2012   For perforated colon related to diverticulitis- Dr. BMarina Gravel . VASECTOMY  2000  . VIDEO ASSISTED THORACOSCOPY (VATS)/THOROCOTOMY Left 08/13/2015   Procedure: LEFT THOROCOTOMY WITH LEFT UPPER LOBECTOMY, PREOP BRONCHOSCOPY;  Surgeon: TNestor Lewandowsky MD;  Location: [facility] ORS;  Service: General;  Laterality: Left;    SOCIAL HISTORY: Social History   Social History  . Marital status: Married    Spouse name: N/A  . Number of children: N/A  . Years of education: N/A   Occupational History  . Not on file.   Social History Main Topics  . Smoking status: Current Every Day Smoker    Packs/day: 0.25    Years: 30.00    Types: Cigarettes  . Smokeless tobacco: Never Used  . Alcohol use 3.0 oz/week    5 Cans of beer per week     Comment: 5  beers a night   . Drug use: No  . Sexual activity: Yes   Other Topics Concern  . Not on file   Social History Narrative  . No narrative on file    FAMILY HISTORY: Family History  Problem Relation Age of Onset  . Arthritis/Rheumatoid      Parent  . Heart disease      Grandparent  . Alcoholism      Other relative  . Arthritis Mother     Rhematoid    ALLERGIES:  has No Known Allergies.  MEDICATIONS:  Current Outpatient Prescriptions  Medication Sig Dispense Refill  . amLODipine (NORVASC) 5 MG tablet Take 5 mg by mouth daily.     Marland Kitchen ibuprofen (ADVIL,MOTRIN) 200 MG tablet Take 1 tablet by mouth every 6 (six) hours as needed.    .  loperamide (IMODIUM A-D) 2 MG tablet Take 2 mg by mouth 4 (four) times daily as needed for diarrhea or loose stools.    . meloxicam (MOBIC) 7.5 MG tablet Take 7.5 mg by mouth daily.     . SUNItinib (SUTENT) 37.5 MG capsule 2 weeks ON; 1 week OFF 14 capsule 6  . omeprazole (PRILOSEC) 20 MG capsule Take 20 mg by mouth daily as needed.     No current facility-administered medications for this visit.       Marland Kitchen  PHYSICAL EXAMINATION: ECOG PERFORMANCE STATUS: 0 - Asymptomatic  Vitals:   04/04/16 1147  BP: 130/80  Pulse: 60  Temp: (!) 96 F (35.6 C)   Filed Weights   04/04/16 1147  Weight: 163 lb 8 oz (74.2 kg)    GENERAL: Thin built moderately nourished lert, no distress and comfortable. He is alone.  He feels better.  EYES: no pallor or icterus OROPHARYNX: no thrush or ulceration; good dentition  NECK: supple, no masses felt LYMPH:  no palpable lymphadenopathy in the cervical, axillary or inguinal regions LUNGS: clear to auscultation and  No wheeze or crackles HEART/CVS: regular rate & rhythm and no murmurs; No lower extremity edema ABDOMEN: abdomen soft, non-tender and normal bowel sounds Musculoskeletal:no cyanosis of digits and no clubbing; purplish discoloration of finger tips/ toes [chronic] PSYCH: alert & oriented x 3 with fluent speech NEURO: no focal motor/sensory deficits   LABORATORY DATA:  I have reviewed the data as listed Lab Results  Component Value Date   WBC 4.2 04/04/2016   HGB 15.5 04/04/2016   HCT 44.8 04/04/2016   MCV 106.0 (H) 04/04/2016   PLT 221 04/04/2016    Recent Labs  11/15/15 0933 11/22/15 1034 11/29/15 1115  02/07/16 1000 03/07/16 0940 04/04/16 1125  NA  --   --   --   < > 137 138 137  K  --   --   --   < > 4.1 4.7 4.7  CL  --   --   --   < > 104 106 104  CO2  --   --   --   < > '28 27 25  '$ GLUCOSE  --   --   --   < > 98 91 94  BUN  --   --   --   < > '17 19 16  '$ CREATININE  --   --   --   < > 1.11 1.22 1.12  CALCIUM  --   --   --   <  > 8.7* 9.0 9.1  GFRNONAA  --   --   --   < > >  60 >60 >60  GFRAA  --   --   --   < > >60 >60 >60  PROT 7.3 7.2 6.5  < > 6.9 7.1 7.6  ALBUMIN 4.1 4.0 3.6  < > 3.9 4.1 4.3  AST 64* 77* 43*  < > '24 30 28  '$ ALT 63 74* 38  < > 13* 21 17  ALKPHOS 71 70 57  < > 64 50 58  BILITOT 1.6* 1.6* 0.4  < > 0.8 0.7 0.8  BILIDIR 0.2 0.3 0.1  --   --   --   --   IBILI 1.4* 1.3* 0.3  --   --   --   --   < > = values in this interval not displayed.  RADIOGRAPHIC STUDIES: I have personally reviewed the radiological images as listed and agreed with the findings in the report. No results found.  ASSESSMENT & PLAN:   Primary cancer of right kidney with metastasis from kidney to other site [facility]) # METASTATIC RENAL CELL CA s/p resection of LUL lung nodule; stage IV [ right lower lobe lung nodule]. NOV 13th  2017- CT- RUL ~ 32m/ improving; no new nodules. Currently on  sutent 37.5 mg 2 week -ON and 1 week OFF; Tolerating better except for mod-fatigue.   # Continue sunitinib at the current dose/schedule. Discussed fatigue continues to worsen; no other reason found- other options are available.  # Scleroderma-/stable to 5 mg Norvasc; followed by rheumatology; contraindication to immunotherapy  # Pulmonary hypertension as noted on CT scan 2 d echo- RVP- pressure 38/slightly higher; ; PFT- "okay"; awaiting sleep study [Dr.Fleming].   # Smoking- states he is cutting down.   # hand foot syndrome- continue vaseline/ hydrocortisone.re: foot care/hand care; improved.   # plan to follow up in 4 weeks/labs; CT scan prior.      GCammie Sickle MD 04/04/2016 4:56 PM

## 2016-04-25 ENCOUNTER — Telehealth: Payer: Self-pay | Admitting: Family Medicine

## 2016-04-25 NOTE — Telephone Encounter (Signed)
Has this been completed?

## 2016-04-25 NOTE — Telephone Encounter (Signed)
Pt wife dropped off Disability Property Tax Exclusion Form to be filled out.. Placed in Dr. Tharon Aquas color folder up front.. Pt needs this mailed back to them.Marland Kitchen

## 2016-04-30 ENCOUNTER — Ambulatory Visit
Admission: RE | Admit: 2016-04-30 | Discharge: 2016-04-30 | Disposition: A | Discharge status: Home / Self Care | Payer: BLUE CROSS/BLUE SHIELD | Source: Ambulatory Visit | Attending: Internal Medicine | Admitting: Internal Medicine

## 2016-04-30 ENCOUNTER — Inpatient Hospital Stay: Payer: BLUE CROSS/BLUE SHIELD | Attending: Internal Medicine

## 2016-04-30 DIAGNOSIS — Z9049 Acquired absence of other specified parts of digestive tract: Secondary | ICD-10-CM | POA: Diagnosis not present

## 2016-04-30 DIAGNOSIS — F438 Other reactions to severe stress: Secondary | ICD-10-CM | POA: Diagnosis not present

## 2016-04-30 DIAGNOSIS — M349 Systemic sclerosis, unspecified: Secondary | ICD-10-CM | POA: Insufficient documentation

## 2016-04-30 DIAGNOSIS — F17210 Nicotine dependence, cigarettes, uncomplicated: Secondary | ICD-10-CM | POA: Diagnosis not present

## 2016-04-30 DIAGNOSIS — Z905 Acquired absence of kidney: Secondary | ICD-10-CM | POA: Insufficient documentation

## 2016-04-30 DIAGNOSIS — I739 Peripheral vascular disease, unspecified: Secondary | ICD-10-CM | POA: Insufficient documentation

## 2016-04-30 DIAGNOSIS — C641 Malignant neoplasm of right kidney, except renal pelvis: Secondary | ICD-10-CM

## 2016-04-30 DIAGNOSIS — C7802 Secondary malignant neoplasm of left lung: Secondary | ICD-10-CM | POA: Diagnosis present

## 2016-04-30 DIAGNOSIS — I10 Essential (primary) hypertension: Secondary | ICD-10-CM | POA: Diagnosis not present

## 2016-04-30 DIAGNOSIS — R230 Cyanosis: Secondary | ICD-10-CM | POA: Diagnosis not present

## 2016-04-30 DIAGNOSIS — Z85528 Personal history of other malignant neoplasm of kidney: Secondary | ICD-10-CM | POA: Insufficient documentation

## 2016-04-30 DIAGNOSIS — C799 Secondary malignant neoplasm of unspecified site: Secondary | ICD-10-CM | POA: Insufficient documentation

## 2016-04-30 DIAGNOSIS — I700 Atherosclerosis of aorta: Secondary | ICD-10-CM | POA: Insufficient documentation

## 2016-04-30 DIAGNOSIS — L271 Localized skin eruption due to drugs and medicaments taken internally: Secondary | ICD-10-CM | POA: Insufficient documentation

## 2016-04-30 DIAGNOSIS — Z9889 Other specified postprocedural states: Secondary | ICD-10-CM | POA: Insufficient documentation

## 2016-04-30 DIAGNOSIS — Z86010 Personal history of colonic polyps: Secondary | ICD-10-CM | POA: Insufficient documentation

## 2016-04-30 DIAGNOSIS — Z79899 Other long term (current) drug therapy: Secondary | ICD-10-CM | POA: Insufficient documentation

## 2016-04-30 DIAGNOSIS — R918 Other nonspecific abnormal finding of lung field: Secondary | ICD-10-CM | POA: Insufficient documentation

## 2016-04-30 DIAGNOSIS — R0609 Other forms of dyspnea: Secondary | ICD-10-CM | POA: Diagnosis not present

## 2016-04-30 DIAGNOSIS — M1990 Unspecified osteoarthritis, unspecified site: Secondary | ICD-10-CM | POA: Insufficient documentation

## 2016-04-30 DIAGNOSIS — R5383 Other fatigue: Secondary | ICD-10-CM | POA: Insufficient documentation

## 2016-04-30 LAB — CBC WITH DIFFERENTIAL/PLATELET
BASOS ABS: 0 10*3/uL (ref 0–0.1)
Basophils Relative: 1 %
EOS PCT: 3 %
Eosinophils Absolute: 0.1 10*3/uL (ref 0–0.7)
HCT: 44.3 % (ref 40.0–52.0)
Hemoglobin: 15.2 g/dL (ref 13.0–18.0)
LYMPHS PCT: 41 %
Lymphs Abs: 1.8 10*3/uL (ref 1.0–3.6)
MCH: 37 pg — ABNORMAL HIGH (ref 26.0–34.0)
MCHC: 34.3 g/dL (ref 32.0–36.0)
MCV: 107.9 fL — AB (ref 80.0–100.0)
MONO ABS: 0.3 10*3/uL (ref 0.2–1.0)
Monocytes Relative: 7 %
Neutro Abs: 2.1 10*3/uL (ref 1.4–6.5)
Neutrophils Relative %: 48 %
PLATELETS: 213 10*3/uL (ref 150–440)
RBC: 4.1 MIL/uL — ABNORMAL LOW (ref 4.40–5.90)
RDW: 15.2 % — AB (ref 11.5–14.5)
WBC: 4.4 10*3/uL (ref 3.8–10.6)

## 2016-04-30 LAB — COMPREHENSIVE METABOLIC PANEL
ALT: 15 U/L — ABNORMAL LOW (ref 17–63)
ANION GAP: 6 (ref 5–15)
AST: 27 U/L (ref 15–41)
Albumin: 4.4 g/dL (ref 3.5–5.0)
Alkaline Phosphatase: 60 U/L (ref 38–126)
BUN: 15 mg/dL (ref 6–20)
CO2: 28 mmol/L (ref 22–32)
Calcium: 8.9 mg/dL (ref 8.9–10.3)
Chloride: 102 mmol/L (ref 101–111)
Creatinine, Ser: 1.09 mg/dL (ref 0.61–1.24)
Glucose, Bld: 92 mg/dL (ref 65–99)
POTASSIUM: 4 mmol/L (ref 3.5–5.1)
Sodium: 136 mmol/L (ref 135–145)
Total Bilirubin: 0.8 mg/dL (ref 0.3–1.2)
Total Protein: 7.2 g/dL (ref 6.5–8.1)

## 2016-05-02 ENCOUNTER — Inpatient Hospital Stay (HOSPITAL_BASED_OUTPATIENT_CLINIC_OR_DEPARTMENT_OTHER): Payer: BLUE CROSS/BLUE SHIELD | Admitting: Internal Medicine

## 2016-05-02 VITALS — BP 122/79 | HR 65 | Temp 95.8°F | Wt 164.2 lb

## 2016-05-02 DIAGNOSIS — Z79899 Other long term (current) drug therapy: Secondary | ICD-10-CM

## 2016-05-02 DIAGNOSIS — F438 Other reactions to severe stress: Secondary | ICD-10-CM

## 2016-05-02 DIAGNOSIS — R0609 Other forms of dyspnea: Secondary | ICD-10-CM | POA: Diagnosis not present

## 2016-05-02 DIAGNOSIS — R230 Cyanosis: Secondary | ICD-10-CM | POA: Diagnosis not present

## 2016-05-02 DIAGNOSIS — C641 Malignant neoplasm of right kidney, except renal pelvis: Secondary | ICD-10-CM

## 2016-05-02 DIAGNOSIS — C7802 Secondary malignant neoplasm of left lung: Secondary | ICD-10-CM | POA: Diagnosis not present

## 2016-05-02 DIAGNOSIS — R918 Other nonspecific abnormal finding of lung field: Secondary | ICD-10-CM | POA: Diagnosis not present

## 2016-05-02 DIAGNOSIS — L271 Localized skin eruption due to drugs and medicaments taken internally: Secondary | ICD-10-CM

## 2016-05-02 DIAGNOSIS — I10 Essential (primary) hypertension: Secondary | ICD-10-CM | POA: Diagnosis not present

## 2016-05-02 DIAGNOSIS — I739 Peripheral vascular disease, unspecified: Secondary | ICD-10-CM | POA: Diagnosis not present

## 2016-05-02 DIAGNOSIS — Z905 Acquired absence of kidney: Secondary | ICD-10-CM | POA: Diagnosis not present

## 2016-05-02 DIAGNOSIS — Z85528 Personal history of other malignant neoplasm of kidney: Secondary | ICD-10-CM | POA: Diagnosis not present

## 2016-05-02 DIAGNOSIS — R5383 Other fatigue: Secondary | ICD-10-CM

## 2016-05-02 DIAGNOSIS — M1990 Unspecified osteoarthritis, unspecified site: Secondary | ICD-10-CM

## 2016-05-02 DIAGNOSIS — M349 Systemic sclerosis, unspecified: Secondary | ICD-10-CM | POA: Diagnosis not present

## 2016-05-02 DIAGNOSIS — F17210 Nicotine dependence, cigarettes, uncomplicated: Secondary | ICD-10-CM

## 2016-05-02 NOTE — Progress Notes (Signed)
[place] NOTE  Patient Care Team: Leone Haven, MD as PCP - General (Family Medicine)  CHIEF COMPLAINTS/PURPOSE OF CONSULTATION:   Oncology History   # JULY 2017 METASTATIC RCC [s/p LUL lung section;Dr.Oaks]; RLL- nodule ~72m  # June 2017- ? Frontal lesion on CT [cannot have MRI]  # RIGHT KIDNEY CANCER [incidental 2012; diverticulitis] pT3a (4.3x4.3x 3.2cm) [[facility] clear cell G-2; Neg margins; May 2012 ]  # July 20th- PAZOPANIB 4 pills/day; Discontinued sec to Elevated LFTs.   # OCT 2017- Start Sunitinib 2w-On; 1 w-OFF; CT NOV 13th- RLL- Improved; no new disease.   # ? Scleroderma [Almodipine; Dr.Kernodle]     Primary cancer of right kidney with metastasis from kidney to other site ([facility]   08/30/2015 Initial Diagnosis    Primary cancer of right kidney with metastasis from kidney to other site ([facility]        HISTORY OF PRESENTING ILLNESS:  Thomas JIRAK89y.o.  male a very pleasant With metastatic renal cell carcinoma to the lung status post resection- currently on Sutent is here for follow-up. Patient is currently on 2 weeks on; 1 week off schedule because of significant fatigue; He is here to review the results of his restaging CAT scan.  Patient continues to have significant fatigue from the pills. 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 Continues to have chronic cyanosis of his upper extremities; however no new ulceration. He does get short of breath on exertion. He is interested in coming off Sutent if possible. No diarrhea.  ROS: A complete 10 point review of system is done which is negative except mentioned above in history of present illness  MEDICAL HISTORY:  Past Medical History:  Diagnosis Date  . Allergic rhinitis   . Anxiety   . Arthritis   . Asthma   . [place] ([place]   . Colitis    Had blood in his stool with this and treated in the hospital   . Colitis cystica profunda   . Colon polyps   . Complication of anesthesia    when waking up get claustophobic with mask, anxiety, panic  . Depression   . Diverticulitis   . ED (erectile dysfunction)   . Genital herpes   . Hypertension   . Kidney disease   . Medical history non-contributory   . Occipital lymphadenopathy   . [place]ipheral vascular disease ([place]   . Renal cancer ([place]   . Shortness of breath dyspnea     SURGICAL HISTORY: Past Surgical History:  Procedure Laterality Date  . APPENDECTOMY  1966  . COLON SURGERY    . KIDNEY SURGERY Right 2012   Nephrectomy  . PARTIAL COLECTOMY  2012   For perforated colon related to diverticulitis- Dr. BMarina Gravel . VASECTOMY  2000  . VIDEO ASSISTED THORACOSCOPY (VATS)/THOROCOTOMY Left 08/13/2015   Procedure: LEFT THOROCOTOMY WITH LEFT UPPER LOBECTOMY, PREOP BRONCHOSCOPY;  Surgeon: TNestor Lewandowsky MD;  Location: [facility] ORS;  Service: General;  Laterality: Left;    SOCIAL HISTORY: Social History   Social History  . Marital status: Married    Spouse name: N/A  . Number of children: N/A  . Years of education: N/A   Occupational History  . Not on file.   Social History Main Topics  . Smoking status: Current Every Day Smoker    Packs/day: 0.25    Years: 30.00    Types: Cigarettes  .  Smokeless tobacco: Never Used  . Alcohol use 3.0 oz/week    5 Cans of beer per week     Comment: 5 beers a night   . Drug use: No  . Sexual activity: Yes   Other Topics Concern  . Not on file   Social History Narrative  . No narrative on file    FAMILY HISTORY: Family History  Problem Relation Age of Onset  . Arthritis/Rheumatoid      Parent  . Heart disease      Grandparent  . Alcoholism      Other relative  . Arthritis Mother     Rhematoid    ALLERGIES:  has No Known Allergies.  MEDICATIONS:  Current Outpatient Prescriptions  Medication Sig Dispense Refill  . amLODipine (NORVASC) 5 MG tablet Take 5 mg by mouth daily.     Marland Kitchen  ibuprofen (ADVIL,MOTRIN) 200 MG tablet Take 1 tablet by mouth every 6 (six) hours as needed.    . loperamide (IMODIUM A-D) 2 MG tablet Take 2 mg by mouth 4 (four) times daily as needed for diarrhea or loose stools.    . meloxicam (MOBIC) 7.5 MG tablet Take 7.5 mg by mouth daily.     . SUNItinib (SUTENT) 37.5 MG capsule 2 weeks ON; 1 week OFF 14 capsule 6   No current facility-administered medications for this visit.       Marland Kitchen  PHYSICAL EXAMINATION: ECOG PERFORMANCE STATUS: 0 - Asymptomatic  Vitals:   05/02/16 0941  BP: 122/79  Pulse: 65  Temp: (!) 95.8 F (35.4 C)   Filed Weights   05/02/16 0941  Weight: 164 lb 4 oz (74.5 kg)    GENERAL: Thin built moderately nourished lert, no distress and comfortable. He is alone.  He feels better.  EYES: no pallor or icterus OROPHARYNX: no thrush or ulceration; good dentition  NECK: supple, no masses felt LYMPH:  no palpable lymphadenopathy in the cervical, axillary or inguinal regions LUNGS: clear to auscultation and  No wheeze or crackles HEART/CVS: regular rate & rhythm and no murmurs; No lower extremity edema ABDOMEN: abdomen soft, non-tender and normal bowel sounds Musculoskeletal:no cyanosis of digits and no clubbing; purplish discoloration of finger tips/ toes [chronic] PSYCH: alert & oriented x 3 with fluent speech NEURO: no focal motor/sensory deficits   LABORATORY DATA:  I have reviewed the data as listed Lab Results  Component Value Date   WBC 4.4 04/30/2016   HGB 15.2 04/30/2016   HCT 44.3 04/30/2016   MCV 107.9 (H) 04/30/2016   PLT 213 04/30/2016    Recent Labs  11/15/15 0933 11/22/15 1034 11/29/15 1115  03/07/16 0940 04/04/16 1125 04/30/16 0955  NA  --   --   --   < > 138 137 136  K  --   --   --   < > 4.7 4.7 4.0  CL  --   --   --   < > 106 104 102  CO2  --   --   --   < > '27 25 28  '$ GLUCOSE  --   --   --   < > 91 94 92  BUN  --   --   --   < > '19 16 15  '$ CREATININE  --   --   --   < > 1.22 1.12 1.09   CALCIUM  --   --   --   < > 9.0 9.1 8.9  GFRNONAA  --   --   --   < > >  60 >60 >60  GFRAA  --   --   --   < > >60 >60 >60  PROT 7.3 7.2 6.5  < > 7.1 7.6 7.2  ALBUMIN 4.1 4.0 3.6  < > 4.1 4.3 4.4  AST 64* 77* 43*  < > '30 28 27  '$ ALT 63 74* 38  < > 21 17 15*  ALKPHOS 71 70 57  < > 50 58 60  BILITOT 1.6* 1.6* 0.4  < > 0.7 0.8 0.8  BILIDIR 0.2 0.3 0.1  --   --   --   --   IBILI 1.4* 1.3* 0.3  --   --   --   --   < > = values in this interval not displayed.  RADIOGRAPHIC STUDIES: I have personally reviewed the radiological images as listed and agreed with the findings in the report. Ct Abdomen Pelvis Wo Contrast  Result Date: 04/30/2016 CLINICAL DATA:  Restaging workup after chemo tx's to eval for response to Right Lung. He c/o extreme fatigue. NKI Hx Right Nephrectomy with Partial Colectomy in 2012 due to Right RCC. Hx LEFT THOROCOTOMY WITH LEFT UPPER LOBECTOMY 08/13/2015. EXAM: CT CHEST, ABDOMEN AND PELVIS WITHOUT CONTRAST TECHNIQUE: Multidetector CT imaging of the chest, abdomen and pelvis was performed following the standard protocol without IV contrast. COMPARISON:  PET-CT 07/12/2015, [place] [address] FINDINGS: CT CHEST FINDINGS Cardiovascular: Coronary artery calcification and aortic atherosclerotic calcification. Mediastinum/Nodes: No axillary supraclavicular adenopathy. No mediastinal hilar adenopathy. No pericardial fluid. Lungs/Pleura: LEFT upper lobectomy anatomy. No nodularity in the LEFT RIGHT lung. Musculoskeletal: No aggressive osseous lesion. CT ABDOMEN AND PELVIS FINDINGS Hepatobiliary: Low-density lesion the RIGHT hepatic LEFT hepatic lobe not changed consistent benign hepatic cysts. Normal gallbladder Pancreas: Pancreas is normal. No ductal dilatation. No pancreatic inflammation. Spleen: Normal spleen Adrenals/urinary tract: Adrenal glands normal. Normal LEFT kidney. Normal ureters and bladder. Post RIGHT nephrectomy.  No nodularity in nephrectomy bed. Stomach/Bowel: Stomach, duodenum and small  bowel normal. There is anastomosis in the sigmoid colon without obstruction or inflammation. Rectum normal Vascular/Lymphatic: Abdominal aorta is normal caliber with atherosclerotic calcification. There is no retroperitoneal or periportal lymphadenopathy. No pelvic lymphadenopathy. Reproductive: Prostate normal Other: No free fluid. Musculoskeletal: No aggressive osseous lesion. IMPRESSION: Chest Impression: 1. No evidence of thoracic metastasis. 2. Stable postoperative change in the LEFT hemithorax Abdomen / Pelvis Impression: 1. No evidence of carcinoma recurrence or metastasis in the abdomen pelvis. 2. Stable RIGHT nephrectomy bed. 3. Stable LEFT colon anastomosis. 4.  Atherosclerotic calcification of the aorta. Electronically Signed   By: Suzy Bouchard M.D.   On: 04/30/2016 15:31   Ct Chest Wo Contrast  Result Date: 04/30/2016 CLINICAL DATA:  RIGHT nephrectomy and partial colectomy for renal cell carcinoma. LEFT upper lobectomy. Current smoker. EXAM:: EXAM: CT CHEST, ABDOMEN AND PELVIS WITHOUT CONTRAST TECHNIQUE: Multidetector CT imaging of the chest, abdomen and pelvis was performed following the standard protocol without IV contrast. COMPARISON:  CT 01/07/2016, PET-CT 07/12/2015 FINDINGS: CT CHEST FINDINGS Cardiovascular: Coronary artery calcification and aortic atherosclerotic calcification. Mediastinum/Nodes: No axillary supraclavicular adenopathy. No mediastinal hilar adenopathy. No pericardial fluid. Lungs/Pleura: LEFT upper lobe wedge resection. No LEFT lung nodularity. LEFT lung is clear. RIGHT lung is clear. Musculoskeletal: No aggressive osseous lesion CT ABDOMEN PELVIS FINDINGS Hepatobiliary: Low-density lesion in the LEFT lateral hepatic lobe stable compared prior exams. normal biliary tree Pancreas: Pancreas is normal. No ductal dilatation. No pancreatic inflammation. Spleen: Normal spleen Adrenals/urinary tract: Adrenal glands normal. Post RIGHT nephrectomy. No nodularity in nephrectomy bed.  LEFT kidney normal on noncontrast exam. Ureter and bladder normal. Stomach/Bowel: Stomach, small bowel normal. Partial RIGHT hemicolectomy. Colon normal. Rectum normal Vascular/Lymphatic: Abdominal aorta is normal caliber with atherosclerotic calcification. There is no retroperitoneal or periportal lymphadenopathy. No pelvic lymphadenopathy. Reproductive: Prostate normal Other: No peritoneal or retroperitoneal new onset no peritoneal or omental nodularity Musculoskeletal: No aggressive osseous lesion. IMPRESSION: Chest Impression: 1. No evidence of thoracic metastasis. 2. Postsurgical change in the LEFT hemithorax. Abdomen / Pelvis Impression: 1. Post RIGHT nephrectomy and partial colectomy. No evidence of local recurrence for progression of carcinoma. 2.  Atherosclerotic calcification of the aorta. Electronically Signed   By: Suzy Bouchard M.D.   On: 04/30/2016 13:49    ASSESSMENT & PLAN:   Primary cancer of right kidney with metastasis from kidney to other site [facility]) # METASTATIC RENAL CELL CA s/p resection of LUL lung nodule; stage IV [ right lower lobe lung nodule]. MARCH 7 th 2018- CT C/A/P- NED/CR [ ~RUL 39m/ improving- resolved; no new nodules].    # Currently on  sutent 37.5 mg 2 week -ON and 1 week OFF; Tolerating with mod-severe fatigue; currently hold off  Sutent especially given CR on March 7th 2018 CT scan.   # Scleroderma-/stable to 5 mg Norvasc; followed by rheumatology; contraindication to immunotherapy. STABLE.   # Pulmonary hypertension as noted on CT scan 2 d echo- RVP- pressure 38/slightly higher; ; PFT- "okay"; awaiting sleep study [Dr.Fleming].   # Smoking- states he is cutting down; still trying to quit.   # hand foot syndrome- continue vaseline/ hydrocortisone.re: foot care/hand care; improved/STABLE.    # plan to follow up in 4 weeks/labs.      GCammie Sickle MD 05/02/2016 1:19 PM

## 2016-05-02 NOTE — Patient Instructions (Signed)
Do not take your sutent until further noticed

## 2016-05-02 NOTE — Assessment & Plan Note (Addendum)
#   METASTATIC RENAL CELL CA s/p resection of LUL lung nodule; stage IV [ right lower lobe lung nodule]. MARCH 7 th 2018- CT C/A/P- NED/CR [ ~RUL 2m/ improving- resolved; no new nodules].    # Currently on  sutent 37.5 mg 2 week -ON and 1 week OFF; Tolerating with mod-severe fatigue; currently hold off  Sutent especially given CR on March 7th 2018 CT scan.   # Scleroderma-/stable to 5 mg Norvasc; followed by rheumatology; contraindication to immunotherapy. STABLE.   # Pulmonary hypertension as noted on CT scan 2 d echo- RVP- pressure 38/slightly higher; ; PFT- "okay"; awaiting sleep study [Dr.Fleming].   # Smoking- states he is cutting down; still trying to quit.   # hand foot syndrome- continue vaseline/ hydrocortisone.re: foot care/hand care; improved/STABLE.    # plan to follow up in 4 weeks/labs.   # I reviewed the blood work- with the patient in detail; also reviewed the imaging independently [as summarized above]; and with the patient in detail.

## 2016-05-02 NOTE — Progress Notes (Signed)
Patient here today for follow up.  Patient c/o diarrhea, sores inside nose, pain/sores around rectum due to diarrhea.

## 2016-05-04 NOTE — Telephone Encounter (Signed)
Is this form supposed to be completed for this patient or the patient's wife. I did not realize the patient had been placed on disability. I believe the patient's wife is on disability. The list of issues that are listed with the paperwork are issues that the patient's wife has. Please determine. Thanks.

## 2016-05-05 NOTE — Telephone Encounter (Signed)
Spoke with the pt's wife and she stated that the paperwork is meant for her husband, Thomas Le. She stated that the pt has cancer and was told to get the paperwork filled out.

## 2016-05-11 NOTE — Telephone Encounter (Signed)
Please check with the patient to see if he has been placed on total and permanent disability by another physician. And if he has who has done this? This form requires that he be permanently disabled to be completed.

## 2016-05-12 NOTE — Telephone Encounter (Signed)
Spoke with pt's wife and she stated that she missed understood her husband when she dropped the paperwork off. She stated that the paperwork is actually for her(Thomas Le) and all it needs to say is what is wrong with her and that she is awaiting disablitity.

## 2016-05-29 NOTE — Telephone Encounter (Signed)
Form has been completed. They can pick it up tomorrow.

## 2016-05-29 NOTE — Telephone Encounter (Signed)
Pt came in to follow up on the form. Please call pt with some information. Thank you!  Call pt @ [phone].

## 2016-05-30 ENCOUNTER — Inpatient Hospital Stay (HOSPITAL_BASED_OUTPATIENT_CLINIC_OR_DEPARTMENT_OTHER): Payer: BLUE CROSS/BLUE SHIELD | Admitting: Internal Medicine

## 2016-05-30 ENCOUNTER — Inpatient Hospital Stay: Payer: BLUE CROSS/BLUE SHIELD | Attending: Internal Medicine

## 2016-05-30 VITALS — BP 110/67 | HR 62 | Temp 96.8°F | Resp 18 | Wt 164.5 lb

## 2016-05-30 DIAGNOSIS — I739 Peripheral vascular disease, unspecified: Secondary | ICD-10-CM | POA: Diagnosis not present

## 2016-05-30 DIAGNOSIS — Z86010 Personal history of colonic polyps: Secondary | ICD-10-CM | POA: Diagnosis not present

## 2016-05-30 DIAGNOSIS — F1010 Alcohol abuse, uncomplicated: Secondary | ICD-10-CM

## 2016-05-30 DIAGNOSIS — R230 Cyanosis: Secondary | ICD-10-CM | POA: Insufficient documentation

## 2016-05-30 DIAGNOSIS — R918 Other nonspecific abnormal finding of lung field: Secondary | ICD-10-CM

## 2016-05-30 DIAGNOSIS — C7802 Secondary malignant neoplasm of left lung: Secondary | ICD-10-CM

## 2016-05-30 DIAGNOSIS — F17210 Nicotine dependence, cigarettes, uncomplicated: Secondary | ICD-10-CM | POA: Insufficient documentation

## 2016-05-30 DIAGNOSIS — I10 Essential (primary) hypertension: Secondary | ICD-10-CM | POA: Diagnosis not present

## 2016-05-30 DIAGNOSIS — I272 Pulmonary hypertension, unspecified: Secondary | ICD-10-CM

## 2016-05-30 DIAGNOSIS — F418 Other specified anxiety disorders: Secondary | ICD-10-CM

## 2016-05-30 DIAGNOSIS — R5383 Other fatigue: Secondary | ICD-10-CM | POA: Diagnosis not present

## 2016-05-30 DIAGNOSIS — Z85528 Personal history of other malignant neoplasm of kidney: Secondary | ICD-10-CM

## 2016-05-30 DIAGNOSIS — M349 Systemic sclerosis, unspecified: Secondary | ICD-10-CM | POA: Insufficient documentation

## 2016-05-30 DIAGNOSIS — C641 Malignant neoplasm of right kidney, except renal pelvis: Secondary | ICD-10-CM

## 2016-05-30 DIAGNOSIS — Z79899 Other long term (current) drug therapy: Secondary | ICD-10-CM

## 2016-05-30 DIAGNOSIS — R0602 Shortness of breath: Secondary | ICD-10-CM | POA: Diagnosis not present

## 2016-05-30 DIAGNOSIS — Z905 Acquired absence of kidney: Secondary | ICD-10-CM | POA: Insufficient documentation

## 2016-05-30 LAB — CBC WITH DIFFERENTIAL/PLATELET
BASOS ABS: 0.1 10*3/uL (ref 0–0.1)
BASOS PCT: 1 %
EOS PCT: 2 %
Eosinophils Absolute: 0.1 10*3/uL (ref 0–0.7)
HCT: 39.5 % — ABNORMAL LOW (ref 40.0–52.0)
Hemoglobin: 13.6 g/dL (ref 13.0–18.0)
Lymphocytes Relative: 23 %
Lymphs Abs: 2.1 10*3/uL (ref 1.0–3.6)
MCH: 37.1 pg — ABNORMAL HIGH (ref 26.0–34.0)
MCHC: 34.6 g/dL (ref 32.0–36.0)
MCV: 107.5 fL — ABNORMAL HIGH (ref 80.0–100.0)
MONO ABS: 0.9 10*3/uL (ref 0.2–1.0)
Monocytes Relative: 11 %
NEUTROS ABS: 5.7 10*3/uL (ref 1.4–6.5)
Neutrophils Relative %: 63 %
PLATELETS: 331 10*3/uL (ref 150–440)
RBC: 3.67 MIL/uL — ABNORMAL LOW (ref 4.40–5.90)
RDW: 14.1 % (ref 11.5–14.5)
WBC: 9 10*3/uL (ref 3.8–10.6)

## 2016-05-30 LAB — COMPREHENSIVE METABOLIC PANEL
ALT: 9 U/L — ABNORMAL LOW (ref 17–63)
AST: 19 U/L (ref 15–41)
Albumin: 3.8 g/dL (ref 3.5–5.0)
Alkaline Phosphatase: 48 U/L (ref 38–126)
Anion gap: 7 (ref 5–15)
BUN: 21 mg/dL — AB (ref 6–20)
CHLORIDE: 105 mmol/L (ref 101–111)
CO2: 25 mmol/L (ref 22–32)
Calcium: 8.9 mg/dL (ref 8.9–10.3)
Creatinine, Ser: 1.12 mg/dL (ref 0.61–1.24)
GFR calc Af Amer: >60 mL/min (ref >60)
GFR calc non Af Amer: >60 mL/min (ref >60)
GLUCOSE: 102 mg/dL — AB (ref 65–99)
POTASSIUM: 4 mmol/L (ref 3.5–5.1)
SODIUM: 137 mmol/L (ref 135–145)
Total Bilirubin: 0.5 mg/dL (ref 0.3–1.2)
Total Protein: 6.8 g/dL (ref 6.5–8.1)

## 2016-05-30 LAB — LACTATE DEHYDROGENASE: LDH: 114 U/L (ref 98–192)

## 2016-05-30 NOTE — Progress Notes (Signed)
[place] NOTE  Patient Care Team: Leone Haven, MD as PCP - General (Family Medicine)  CHIEF COMPLAINTS/PURPOSE OF CONSULTATION:   Oncology History   # JULY 2017 METASTATIC RCC [s/p LUL lung section;Dr.Oaks]; RLL- nodule ~23m  # June 2017- ? Frontal lesion on CT [cannot have MRI]  # RIGHT KIDNEY CANCER [incidental 2012; diverticulitis] pT3a (4.3x4.3x 3.2cm) [[facility] clear cell G-2; Neg margins; May 2012 ]  # July 20th- PAZOPANIB 4 pills/day; Discontinued sec to Elevated LFTs.   # OCT 2017- Start Sunitinib 2w-On; 1 w-OFF; CT NOV 13th- RLL- Improved; no new disease. MARCH 7th CT- CR.   # ? Scleroderma [Almodipine; Dr.Kernodle]     Primary cancer of right kidney with metastasis from kidney to other site ([facility]   08/30/2015 Initial Diagnosis    Primary cancer of right kidney with metastasis from kidney to other site ([facility]        HISTORY OF PRESENTING ILLNESS:  MMONTEE TALLMAN89y.o.  male a very pleasant With metastatic renal cell carcinoma to the lung status post resection- currently on Sutent is here for follow-up. Patient Sutent has been on hold for the last 1 month..  Since being off treatment/Sutent for the last 1 month patient's fatigue is significantly improved.  Continues to have chronic cyanosis of his upper extremities; however no new ulceration. Chronic mild shortness of breath. He is continuing to try to quit smoking.   ROS: A complete 10 point review of system is done which is negative except mentioned above in history of present illness  MEDICAL HISTORY:  Past Medical History:  Diagnosis Date  . Allergic rhinitis   . Anxiety   . Arthritis   . Asthma   . [place]itis ([place]   . Colitis    Had blood in his stool with this and treated in the hospital  . Colitis cystica profunda   . Colon polyps   . Complication of anesthesia    when waking up get claustophobic with mask, anxiety, panic  . Depression   . Diverticulitis   . ED  (erectile dysfunction)   . Genital herpes   . Hypertension   . Kidney disease   . Medical history non-contributory   . Occipital lymphadenopathy   . [place]ipheral vascular disease ([place]   . Renal cancer ([place]   . Shortness of breath dyspnea     SURGICAL HISTORY: Past Surgical History:  Procedure Laterality Date  . APPENDECTOMY  1966  . COLON SURGERY    . KIDNEY SURGERY Right 2012   Nephrectomy  . PARTIAL COLECTOMY  2012   For perforated colon related to diverticulitis- Dr. BMarina Gravel . VASECTOMY  2000  . VIDEO ASSISTED THORACOSCOPY (VATS)/THOROCOTOMY Left 08/13/2015   Procedure: LEFT THOROCOTOMY WITH LEFT UPPER LOBECTOMY, PREOP BRONCHOSCOPY;  Surgeon: TNestor Lewandowsky MD;  Location: [facility] ORS;  Service: General;  Laterality: Left;    SOCIAL HISTORY: Social History   Social History  . Marital status: Married    Spouse name: N/A  . Number of children: N/A  . Years of education: N/A   Occupational History  . Not on file.   Social History Main Topics  . Smoking status: Current Every Day Smoker    Packs/day: 0.25    Years: 30.00    Types: Cigarettes  . Smokeless tobacco: Never Used  . Alcohol use 3.0 oz/week    5 Cans of beer per week     Comment: 5 beers a night   .  Drug use: No  . Sexual activity: Yes   Other Topics Concern  . Not on file   Social History Narrative  . No narrative on file    FAMILY HISTORY: Family History  Problem Relation Age of Onset  . Arthritis/Rheumatoid      Parent  . Heart disease      Grandparent  . Alcoholism      Other relative  . Arthritis Mother     Rhematoid    ALLERGIES:  has No Known Allergies.  MEDICATIONS:  Current Outpatient Prescriptions  Medication Sig Dispense Refill  . amLODipine (NORVASC) 5 MG tablet Take 5 mg by mouth daily.     Marland Kitchen ibuprofen (ADVIL,MOTRIN) 200 MG tablet Take 1 tablet by mouth every 6 (six) hours as needed.    . loperamide (IMODIUM A-D) 2 MG tablet Take 2 mg by mouth 4 (four) times daily as needed for  diarrhea or loose stools.    . meloxicam (MOBIC) 7.5 MG tablet Take 7.5 mg by mouth daily.     Marland Kitchen VIAGRA 50 MG tablet TAKE 2 TABLETS (100 MG TOTAL) BY MOUTH DAILY AS NEEDED FOR ERECTILE DYSFUNCTION.  0  . SUNItinib (SUTENT) 37.5 MG capsule 2 weeks ON; 1 week OFF (Patient not taking: Reported on 05/30/2016) 14 capsule 6   No current facility-administered medications for this visit.       Marland Kitchen  PHYSICAL EXAMINATION: ECOG PERFORMANCE STATUS: 0 - Asymptomatic  Vitals:   05/30/16 1413  BP: 110/67  Pulse: 62  Resp: 18  Temp: (!) 96.8 F (36 C)   Filed Weights   05/30/16 1418  Weight: 164 lb 8 oz (74.6 kg)    GENERAL: Thin built moderately nourished lert, no distress and comfortable. He is alone.  He feels better.  EYES: no pallor or icterus OROPHARYNX: no thrush or ulceration; good dentition  NECK: supple, no masses felt LYMPH:  no palpable lymphadenopathy in the cervical, axillary or inguinal regions LUNGS: clear to auscultation and  No wheeze or crackles HEART/CVS: regular rate & rhythm and no murmurs; No lower extremity edema ABDOMEN: abdomen soft, non-tender and normal bowel sounds Musculoskeletal:no cyanosis of digits and no clubbing; purplish discoloration of finger tips/ toes [chronic] PSYCH: alert & oriented x 3 with fluent speech NEURO: no focal motor/sensory deficits   LABORATORY DATA:  I have reviewed the data as listed Lab Results  Component Value Date   WBC 9.0 05/30/2016   HGB 13.6 05/30/2016   HCT 39.5 (L) 05/30/2016   MCV 107.5 (H) 05/30/2016   PLT 331 05/30/2016    Recent Labs  11/15/15 0933 11/22/15 1034 11/29/15 1115  04/04/16 1125 04/30/16 0955 05/30/16 1350  NA  --   --   --   < > 137 136 137  K  --   --   --   < > 4.7 4.0 4.0  CL  --   --   --   < > 104 102 105  CO2  --   --   --   < > '25 28 25  '$ GLUCOSE  --   --   --   < > 94 92 102*  BUN  --   --   --   < > 16 15 21*  CREATININE  --   --   --   < > 1.12 1.09 1.12  CALCIUM  --   --   --   <  > 9.1 8.9 8.9  GFRNONAA  --   --   --   < > >  60 >60 >60  GFRAA  --   --   --   < > >60 >60 >60  PROT 7.3 7.2 6.5  < > 7.6 7.2 6.8  ALBUMIN 4.1 4.0 3.6  < > 4.3 4.4 3.8  AST 64* 77* 43*  < > '28 27 19  '$ ALT 63 74* 38  < > 17 15* 9*  ALKPHOS 71 70 57  < > 58 60 48  BILITOT 1.6* 1.6* 0.4  < > 0.8 0.8 0.5  BILIDIR 0.2 0.3 0.1  --   --   --   --   IBILI 1.4* 1.3* 0.3  --   --   --   --   < > = values in this interval not displayed.  RADIOGRAPHIC STUDIES: I have personally reviewed the radiological images as listed and agreed with the findings in the report. No results found.  ASSESSMENT & PLAN:   Primary cancer of right kidney with metastasis from kidney to other site [facility]) # METASTATIC RENAL CELL CA s/p resection of LUL lung nodule; stage IV [ right lower lobe lung nodule]. MARCH 7 th 2018- CT C/A/P- NED/CR [ ~RUL 35m/ improving- resolved; no new nodules].    # Currently on  sutent 37.5 mg 2 week -ON and 1 week OFF; Tolerating with mod-severe fatigue; currently hold off sec to severe fatigue; improved. Plan to hold off for 1 more month; and then re-start at next visit. Discussed with the patient that if his fatigue continues to be a major problem then recommend- cabometyx.  # Scleroderma-/stable to 5 mg Norvasc; followed by rheumatology; contraindication to immunotherapy. STABLE.   # Pulmonary hypertension as noted on CT scan 2 d echo- RVP- pressure 38/slightly higher;  # Smoking- states he is cutting down; still trying to quit. Again counseled to quit smoking.   # plan to follow up in 4 weeks/labs; will plan re-starting at that time.      GCammie Sickle MD 05/30/2016 4:17 PM

## 2016-05-30 NOTE — Assessment & Plan Note (Signed)
#   METASTATIC RENAL CELL CA s/p resection of LUL lung nodule; stage IV [ right lower lobe lung nodule]. MARCH 7 th 2018- CT C/A/P- NED/CR [ ~RUL 13m/ improving- resolved; no new nodules].    # Currently on  sutent 37.5 mg 2 week -ON and 1 week OFF; Tolerating with mod-severe fatigue; currently hold off sec to severe fatigue; improved. Plan to hold off for 1 more month; and then re-start at next visit. Discussed with the patient that if his fatigue continues to be a major problem then recommend- cabometyx.  # Scleroderma-/stable to 5 mg Norvasc; followed by rheumatology; contraindication to immunotherapy. STABLE.   # Pulmonary hypertension as noted on CT scan 2 d echo- RVP- pressure 38/slightly higher;  # Smoking- states he is cutting down; still trying to quit. Again counseled to quit smoking.   # plan to follow up in 4 weeks/labs; will plan re-starting at that time.

## 2016-05-30 NOTE — Progress Notes (Signed)
Patient here today for follow up.   

## 2016-05-30 NOTE — Telephone Encounter (Signed)
Patient notified, form at front desk for pick up

## 2016-06-27 ENCOUNTER — Inpatient Hospital Stay: Payer: BLUE CROSS/BLUE SHIELD | Attending: Internal Medicine

## 2016-06-27 ENCOUNTER — Inpatient Hospital Stay (HOSPITAL_BASED_OUTPATIENT_CLINIC_OR_DEPARTMENT_OTHER): Payer: BLUE CROSS/BLUE SHIELD | Admitting: Internal Medicine

## 2016-06-27 ENCOUNTER — Other Ambulatory Visit: Payer: Self-pay

## 2016-06-27 VITALS — BP 114/67 | HR 58 | Temp 98.6°F | Resp 16 | Ht 73.0 in | Wt 167.4 lb

## 2016-06-27 DIAGNOSIS — R7989 Other specified abnormal findings of blood chemistry: Secondary | ICD-10-CM | POA: Insufficient documentation

## 2016-06-27 DIAGNOSIS — Z79899 Other long term (current) drug therapy: Secondary | ICD-10-CM

## 2016-06-27 DIAGNOSIS — F418 Other specified anxiety disorders: Secondary | ICD-10-CM

## 2016-06-27 DIAGNOSIS — C7802 Secondary malignant neoplasm of left lung: Secondary | ICD-10-CM

## 2016-06-27 DIAGNOSIS — Z85528 Personal history of other malignant neoplasm of kidney: Secondary | ICD-10-CM

## 2016-06-27 DIAGNOSIS — R230 Cyanosis: Secondary | ICD-10-CM

## 2016-06-27 DIAGNOSIS — M349 Systemic sclerosis, unspecified: Secondary | ICD-10-CM

## 2016-06-27 DIAGNOSIS — R918 Other nonspecific abnormal finding of lung field: Secondary | ICD-10-CM | POA: Diagnosis not present

## 2016-06-27 DIAGNOSIS — J449 Chronic obstructive pulmonary disease, unspecified: Secondary | ICD-10-CM | POA: Insufficient documentation

## 2016-06-27 DIAGNOSIS — Z86010 Personal history of colonic polyps: Secondary | ICD-10-CM

## 2016-06-27 DIAGNOSIS — C641 Malignant neoplasm of right kidney, except renal pelvis: Secondary | ICD-10-CM

## 2016-06-27 DIAGNOSIS — I739 Peripheral vascular disease, unspecified: Secondary | ICD-10-CM | POA: Insufficient documentation

## 2016-06-27 DIAGNOSIS — I10 Essential (primary) hypertension: Secondary | ICD-10-CM

## 2016-06-27 DIAGNOSIS — L271 Localized skin eruption due to drugs and medicaments taken internally: Secondary | ICD-10-CM

## 2016-06-27 DIAGNOSIS — R5383 Other fatigue: Secondary | ICD-10-CM | POA: Diagnosis not present

## 2016-06-27 DIAGNOSIS — Z905 Acquired absence of kidney: Secondary | ICD-10-CM

## 2016-06-27 DIAGNOSIS — F17210 Nicotine dependence, cigarettes, uncomplicated: Secondary | ICD-10-CM | POA: Diagnosis not present

## 2016-06-27 LAB — COMPREHENSIVE METABOLIC PANEL
ALT: 10 U/L — ABNORMAL LOW (ref 17–63)
ANION GAP: 7 (ref 5–15)
AST: 19 U/L (ref 15–41)
Albumin: 4 g/dL (ref 3.5–5.0)
Alkaline Phosphatase: 56 U/L (ref 38–126)
BILIRUBIN TOTAL: 0.6 mg/dL (ref 0.3–1.2)
BUN: 19 mg/dL (ref 6–20)
CHLORIDE: 105 mmol/L (ref 101–111)
CO2: 25 mmol/L (ref 22–32)
Calcium: 9.1 mg/dL (ref 8.9–10.3)
Creatinine, Ser: 0.97 mg/dL (ref 0.61–1.24)
Glucose, Bld: 82 mg/dL (ref 65–99)
POTASSIUM: 4.3 mmol/L (ref 3.5–5.1)
Sodium: 137 mmol/L (ref 135–145)
TOTAL PROTEIN: 7.1 g/dL (ref 6.5–8.1)

## 2016-06-27 LAB — CBC WITH DIFFERENTIAL/PLATELET
Basophils Absolute: 0.1 10*3/uL (ref 0–0.1)
Basophils Relative: 1 %
Eosinophils Absolute: 0.2 10*3/uL (ref 0–0.7)
Eosinophils Relative: 3 %
HEMATOCRIT: 43.9 % (ref 40.0–52.0)
Hemoglobin: 15.2 g/dL (ref 13.0–18.0)
LYMPHS ABS: 1.9 10*3/uL (ref 1.0–3.6)
LYMPHS PCT: 22 %
MCH: 36.1 pg — AB (ref 26.0–34.0)
MCHC: 34.7 g/dL (ref 32.0–36.0)
MCV: 104 fL — AB (ref 80.0–100.0)
Monocytes Absolute: 1 10*3/uL (ref 0.2–1.0)
Monocytes Relative: 11 %
Neutro Abs: 5.7 10*3/uL (ref 1.4–6.5)
Neutrophils Relative %: 63 %
PLATELETS: 259 10*3/uL (ref 150–440)
RBC: 4.22 MIL/uL — AB (ref 4.40–5.90)
RDW: 13.4 % (ref 11.5–14.5)
WBC: 9 10*3/uL (ref 3.8–10.6)

## 2016-06-27 NOTE — Progress Notes (Signed)
[place] NOTE  Patient Care Team: Leone Haven, MD as PCP - General (Family Medicine)  CHIEF COMPLAINTS/PURPOSE OF CONSULTATION:   Oncology History   # JULY 2017 METASTATIC RCC [s/p LUL lung section;Dr.Oaks]; RLL- nodule ~78m  # June 2017- ? Frontal lesion on CT [cannot have MRI]  # RIGHT KIDNEY CANCER [incidental 2012; diverticulitis] pT3a (4.3x4.3x 3.2cm) [[facility] clear cell G-2; Neg margins; May 2012 ]  # July 20th- PAZOPANIB 4 pills/day; Discontinued sec to Elevated LFTs.   # OCT 2017- Start Sunitinib 2w-On; 1 w-OFF; CT NOV 13th- RLL- Improved; no new disease. MARCH 7th CT- CR.   # ? Scleroderma [Almodipine; Dr.Kernodle]- CONTRAINDICATION to IMMUNOTHERAPY.      Primary cancer of right kidney with metastasis from kidney to other site ([facility]   08/30/2015 Initial Diagnosis    Primary cancer of right kidney with metastasis from kidney to other site ([facility]        HISTORY OF PRESENTING ILLNESS:  Thomas WENDELL89y.o.  male a very pleasant With metastatic renal cell carcinoma to the lung status post resection- currently on Sutent is here for follow-up. Patient Sutent has been on hold for the last 2 months.- Secondary tolerance/fatigue.  Since being off treatment/Sutent for the last 2 month patient's fatigue is significantly improved. However he continues to have fatigue which is mild. He attributes this to his social situation/stress [his winding down hi business].  Continues to have chronic cyanosis of his upper extremities; however no new ulceration. This also improved with warm weather. He states his fingertips are not cracking as the used to while on Sutent.Chronic mild shortness of breath. Unfortunately continues to smoke; tried to quit smoking  ROS: A complete 10 point review of system is done which is negative except mentioned above in history of present illness  MEDICAL HISTORY:  Past Medical History:  Diagnosis Date  . Allergic rhinitis   .  Anxiety   . Arthritis   . Asthma   . [place]   . Colitis    Had blood in his stool with this and treated in the hospital  . Colitis cystica profunda   . Colon polyps   . Complication of anesthesia    when waking up get claustophobic with mask, anxiety, panic  . Depression   . Diverticulitis   . ED (erectile dysfunction)   . Genital herpes   . Hypertension   . Kidney disease   . Medical history non-contributory   . Occipital lymphadenopathy   . [place]ipheral vascular disease [place]   . [place] ([place]   . Shortness of breath dyspnea     SURGICAL HISTORY: Past Surgical History:  Procedure Laterality Date  . APPENDECTOMY  1966  . COLON SURGERY    . KIDNEY SURGERY Right 2012   Nephrectomy  . PARTIAL COLECTOMY  2012   For perforated colon related to diverticulitis- Dr. BMarina Gravel . VASECTOMY  2000  . VIDEO ASSISTED THORACOSCOPY (VATS)/THOROCOTOMY Left 08/13/2015   Procedure: LEFT THOROCOTOMY WITH LEFT UPPER LOBECTOMY, PREOP BRONCHOSCOPY;  Surgeon: TNestor Lewandowsky MD;  Location: [facility] ORS;  Service: General;  Laterality: Left;    SOCIAL HISTORY: Social History   Social History  . Marital status: Married    Spouse name: N/A  . Number of children: N/A  . Years of education: N/A   Occupational History  . Not on file.   Social History Main Topics  . Smoking status: Current Every Day Smoker  Packs/day: 0.25    Years: 30.00    Types: Cigarettes  . Smokeless tobacco: Never Used  . Alcohol use 3.0 oz/week    5 Cans of beer per week     Comment: 5 beers a night   . Drug use: No  . Sexual activity: Yes   Other Topics Concern  . Not on file   Social History Narrative  . No narrative on file    FAMILY HISTORY: Family History  Problem Relation Age of Onset  . Arthritis/Rheumatoid      Parent  . Heart disease      Grandparent  . Alcoholism      Other relative  . Arthritis Mother     Rhematoid    ALLERGIES:  has No Known Allergies.  MEDICATIONS:   Current Outpatient Prescriptions  Medication Sig Dispense Refill  . amLODipine (NORVASC) 5 MG tablet Take 5 mg by mouth daily.     Marland Kitchen ibuprofen (ADVIL,MOTRIN) 200 MG tablet Take 1 tablet by mouth every 6 (six) hours as needed.    . loperamide (IMODIUM A-D) 2 MG tablet Take 2 mg by mouth 4 (four) times daily as needed for diarrhea or loose stools.    . SUNItinib (SUTENT) 37.5 MG capsule 2 weeks ON; 1 week OFF (Patient not taking: Reported on 05/30/2016) 14 capsule 6  . VIAGRA 50 MG tablet TAKE 2 TABLETS (100 MG TOTAL) BY MOUTH DAILY AS NEEDED FOR ERECTILE DYSFUNCTION.  0   No current facility-administered medications for this visit.       Marland Kitchen  PHYSICAL EXAMINATION: ECOG PERFORMANCE STATUS: 0 - Asymptomatic  Vitals:   06/27/16 1424  BP: 114/67  Pulse: (!) 58  Resp: 16  Temp: 98.6 F (37 C)   Filed Weights   06/27/16 1424  Weight: 167 lb 6.4 oz (75.9 kg)    GENERAL: Thin built moderately nourished lert, no distress and comfortable. He is alone.  He feels better.  EYES: no pallor or icterus OROPHARYNX: no thrush or ulceration; good dentition  NECK: supple, no masses felt LYMPH:  no palpable lymphadenopathy in the cervical, axillary or inguinal regions LUNGS: clear to auscultation and  No wheeze or crackles HEART/CVS: regular rate & rhythm and no murmurs; No lower extremity edema ABDOMEN: abdomen soft, non-tender and normal bowel sounds Musculoskeletal:no cyanosis of digits and no clubbing; purplish discoloration of finger tips/ toes [chronic] PSYCH: alert & oriented x 3 with fluent speech NEURO: no focal motor/sensory deficits   LABORATORY DATA:  I have reviewed the data as listed Lab Results  Component Value Date   WBC 9.0 06/27/2016   HGB 15.2 06/27/2016   HCT 43.9 06/27/2016   MCV 104.0 (H) 06/27/2016   PLT 259 06/27/2016    Recent Labs  11/15/15 0933 11/22/15 1034 11/29/15 1115  04/30/16 0955 05/30/16 1350 06/27/16 1355  NA  --   --   --   < > 136 137 137   K  --   --   --   < > 4.0 4.0 4.3  CL  --   --   --   < > 102 105 105  CO2  --   --   --   < > '28 25 25  '$ GLUCOSE  --   --   --   < > 92 102* 82  BUN  --   --   --   < > 15 21* 19  CREATININE  --   --   --   < >  1.09 1.12 0.97  CALCIUM  --   --   --   < > 8.9 8.9 9.1  GFRNONAA  --   --   --   < > >60 >60 >60  GFRAA  --   --   --   < > >60 >60 >60  PROT 7.3 7.2 6.5  < > 7.2 6.8 7.1  ALBUMIN 4.1 4.0 3.6  < > 4.4 3.8 4.0  AST 64* 77* 43*  < > '27 19 19  '$ ALT 63 74* 38  < > 15* 9* 10*  ALKPHOS 71 70 57  < > 60 48 56  BILITOT 1.6* 1.6* 0.4  < > 0.8 0.5 0.6  BILIDIR 0.2 0.3 0.1  --   --   --   --   IBILI 1.4* 1.3* 0.3  --   --   --   --   < > = values in this interval not displayed.from a value  RADIOGRAPHIC STUDIES: I have personally reviewed the radiological images as listed and agreed with the findings in the report. No results found.  ASSESSMENT & PLAN:   Primary cancer of right kidney with metastasis from kidney to other site [facility]) # METASTATIC RENAL CELL CA s/p resection of LUL lung nodule; stage IV [ right lower lobe lung nodule]. MARCH 7 th 2018- CT C/A/P- NED/CR [ ~RUL 58m/ improving- resolved; no new nodules].    # Currently on  sutent 37.5 mg 2 week -ON and 1 week OFF; Tolerating with mod-severe fatigue; currently hold off sec to severe fatigue/ hand foot syndrome; improved.  #  Plan to RE- start today; Discussed with the patient that if his fatigue continues to be a major problem then recommend- cabometyx as the next option.   # Scleroderma-/stable to 5 mg Norvasc; followed by rheumatologySTABLE.   # Smoking- states he is cutting down; still trying to quit.   # plan to follow up in 4 weeks/labs.      GCammie Sickle MD 06/27/2016 5:31 PM

## 2016-06-27 NOTE — Assessment & Plan Note (Addendum)
#   METASTATIC RENAL CELL CA s/p resection of LUL lung nodule; stage IV [ right lower lobe lung nodule]. MARCH 7 th 2018- CT C/A/P- NED/CR [ ~RUL 73m/ improving- resolved; no new nodules].    # Currently on  sutent 37.5 mg 2 week -ON and 1 week OFF; Tolerating with mod-severe fatigue; currently hold off sec to severe fatigue/ hand foot syndrome; improved.  #  Plan to RE- start today; Discussed with the patient that if his fatigue continues to be a major problem then recommend- cabometyx as the next option.   # Scleroderma-/stable to 5 mg Norvasc; followed by rheumatologySTABLE.   # Smoking- states he is cutting down; still trying to quit.   # plan to follow up in 4 weeks/labs.

## 2016-06-30 ENCOUNTER — Other Ambulatory Visit: Payer: Self-pay | Admitting: *Deleted

## 2016-06-30 DIAGNOSIS — C641 Malignant neoplasm of right kidney, except renal pelvis: Secondary | ICD-10-CM

## 2016-06-30 MED ORDER — SUNITINIB MALATE 37.5 MG PO CAPS: ORAL_CAPSULE | ORAL | 6 refills | Status: DC

## 2016-06-30 NOTE — Telephone Encounter (Signed)
Was instructed to restart Sutent and [employer] said he needs new rx sent since he has not had it in more than 60 days

## 2016-07-24 ENCOUNTER — Inpatient Hospital Stay (HOSPITAL_BASED_OUTPATIENT_CLINIC_OR_DEPARTMENT_OTHER): Payer: BLUE CROSS/BLUE SHIELD | Admitting: Internal Medicine

## 2016-07-24 ENCOUNTER — Inpatient Hospital Stay: Payer: BLUE CROSS/BLUE SHIELD

## 2016-07-24 VITALS — BP 111/69 | HR 67 | Temp 97.8°F | Resp 18 | Ht 73.0 in | Wt 158.8 lb

## 2016-07-24 DIAGNOSIS — I10 Essential (primary) hypertension: Secondary | ICD-10-CM | POA: Diagnosis not present

## 2016-07-24 DIAGNOSIS — F17210 Nicotine dependence, cigarettes, uncomplicated: Secondary | ICD-10-CM

## 2016-07-24 DIAGNOSIS — M349 Systemic sclerosis, unspecified: Secondary | ICD-10-CM

## 2016-07-24 DIAGNOSIS — R918 Other nonspecific abnormal finding of lung field: Secondary | ICD-10-CM | POA: Diagnosis not present

## 2016-07-24 DIAGNOSIS — J449 Chronic obstructive pulmonary disease, unspecified: Secondary | ICD-10-CM | POA: Diagnosis not present

## 2016-07-24 DIAGNOSIS — L271 Localized skin eruption due to drugs and medicaments taken internally: Secondary | ICD-10-CM

## 2016-07-24 DIAGNOSIS — Z905 Acquired absence of kidney: Secondary | ICD-10-CM | POA: Diagnosis not present

## 2016-07-24 DIAGNOSIS — I739 Peripheral vascular disease, unspecified: Secondary | ICD-10-CM

## 2016-07-24 DIAGNOSIS — Z85528 Personal history of other malignant neoplasm of kidney: Secondary | ICD-10-CM

## 2016-07-24 DIAGNOSIS — Z79899 Other long term (current) drug therapy: Secondary | ICD-10-CM

## 2016-07-24 DIAGNOSIS — C7802 Secondary malignant neoplasm of left lung: Secondary | ICD-10-CM

## 2016-07-24 DIAGNOSIS — R7989 Other specified abnormal findings of blood chemistry: Secondary | ICD-10-CM | POA: Diagnosis not present

## 2016-07-24 DIAGNOSIS — F418 Other specified anxiety disorders: Secondary | ICD-10-CM | POA: Diagnosis not present

## 2016-07-24 DIAGNOSIS — C641 Malignant neoplasm of right kidney, except renal pelvis: Secondary | ICD-10-CM

## 2016-07-24 DIAGNOSIS — R5383 Other fatigue: Secondary | ICD-10-CM

## 2016-07-24 DIAGNOSIS — Z86010 Personal history of colonic polyps: Secondary | ICD-10-CM

## 2016-07-24 LAB — CBC WITH DIFFERENTIAL/PLATELET
Basophils Absolute: 0.1 10*3/uL (ref 0–0.1)
Basophils Relative: 1 %
Eosinophils Absolute: 0.3 10*3/uL (ref 0–0.7)
Eosinophils Relative: 3 %
HCT: 48 % (ref 40.0–52.0)
HEMOGLOBIN: 16.5 g/dL (ref 13.0–18.0)
LYMPHS ABS: 2 10*3/uL (ref 1.0–3.6)
LYMPHS PCT: 19 %
MCH: 34.9 pg — AB (ref 26.0–34.0)
MCHC: 34.3 g/dL (ref 32.0–36.0)
MCV: 101.8 fL — AB (ref 80.0–100.0)
Monocytes Absolute: 0.6 10*3/uL (ref 0.2–1.0)
Monocytes Relative: 5 %
NEUTROS ABS: 7.7 10*3/uL — AB (ref 1.4–6.5)
NEUTROS PCT: 72 %
Platelets: 192 10*3/uL (ref 150–440)
RBC: 4.72 MIL/uL (ref 4.40–5.90)
RDW: 13.4 % (ref 11.5–14.5)
WBC: 10.5 10*3/uL (ref 3.8–10.6)

## 2016-07-24 LAB — COMPREHENSIVE METABOLIC PANEL
ALT: 12 U/L — ABNORMAL LOW (ref 17–63)
AST: 24 U/L (ref 15–41)
Albumin: 3.9 g/dL (ref 3.5–5.0)
Alkaline Phosphatase: 64 U/L (ref 38–126)
Anion gap: 5 (ref 5–15)
BUN: 19 mg/dL (ref 6–20)
CHLORIDE: 105 mmol/L (ref 101–111)
CO2: 28 mmol/L (ref 22–32)
Calcium: 9.3 mg/dL (ref 8.9–10.3)
Creatinine, Ser: 1.27 mg/dL — ABNORMAL HIGH (ref 0.61–1.24)
GFR, EST NON AFRICAN AMERICAN: 59 mL/min — AB (ref >60)
Glucose, Bld: 106 mg/dL — ABNORMAL HIGH (ref 65–99)
POTASSIUM: 4.2 mmol/L (ref 3.5–5.1)
Sodium: 138 mmol/L (ref 135–145)
Total Bilirubin: 0.9 mg/dL (ref 0.3–1.2)
Total Protein: 6.9 g/dL (ref 6.5–8.1)

## 2016-07-24 MED ORDER — FLUTICASONE-SALMETEROL 500-50 MCG/DOSE IN AEPB: 1.0000 | INHALATION_SPRAY | Freq: Two times a day (BID) | RESPIRATORY_TRACT | 3 refills | Status: DC

## 2016-07-24 NOTE — Patient Instructions (Signed)
Please hold the Sutent due your symptoms of fatigue.  MD has sent a new prescription to your pharmacy for Advair for your shortness of breath.     Fluticasone; Salmeterol inhalation aerosol What is this medicine? FLUTICASONE; SALMETEROL (floo TIK a sone; sal ME te role) inhalation is a combination of two medicines that decrease inflammation and help to open up the airways of your lungs. It is used to treat asthma. Do NOT use for an acute asthma attack. This medicine may be used for other purposes; ask your health care provider or pharmacist if you have questions. COMMON BRAND NAME(S): Advair HFA What should I tell my health care provider before I take this medicine? They need to know if you have any of these conditions: -bone problems -immune system problems -diabetes -heart disease or irregular heartbeat -high blood pressure -infection -pheochromocytoma -seizures -thyroid disease -worsening asthma -an unusual or allergic reaction to fluticasone; salmeterol, other corticosteroids, other medicines, foods, dyes, or preservatives -pregnant or trying to get pregnant -breast-feeding How should I use this medicine? This medicine is inhaled through the mouth. Follow the directions on the prescription label. Shake well for 5 seconds before each use. After using the inhaler, rinse your mouth with water. Make sure not to swallow the water. Take your medicine at regular intervals. Do not take your medicine more often than directed. Do not stop taking except on your doctor's advice. Make sure that you are using your inhaler correctly. Ask you doctor or health care provider if you have any questions. A special MedGuide will be given to you by the pharmacist with each prescription and refill. Be sure to read this information carefully each time. Talk to your pediatrician regarding the use of this medicine in children. While this drug may be prescribed for children as young as 48 years of age for  selected conditions, precautions do apply. Overdosage: If you think you have taken too much of this medicine contact a poison control center or emergency room at once. NOTE: This medicine is only for you. Do not share this medicine with others. What if I miss a dose? If you miss a dose, use it as soon as you remember. If it is almost time for your next dose, use only that dose and continue with your regular schedule, spacing doses evenly. Do not use double or extra doses. What may interact with this medicine? Do not take this medicine with any of the following medications: -MAOIs like Carbex, Eldepryl, Marplan, Nardil, and Parnate This medicine may also interact with the following medications: -aminophylline or theophylline -antiviral medicines for HIV or AIDS -beta-blockers like metoprolol and propranolol -certain antibiotics like clarithromycin, erythromycin, levofloxacin, linezolid, and telithromycin -certain medicines for fungal infections like ketoconazole, itraconazole, posaconazole, voriconazole -conivaptan -diuretics -medicines for colds -medicines for depression or emotional conditions -nefazodone -vaccines This list may not describe all possible interactions. Give your health care provider a list of all the medicines, herbs, non-prescription drugs, or dietary supplements you use. Also tell them if you smoke, drink alcohol, or use illegal drugs. Some items may interact with your medicine. What should I watch for while using this medicine? Visit your doctor for regular check ups. Tell your doctor or health care professional if your symptoms do not get better. Do not use this medicine more than every 12 hours. NEVER use this medicine for an acute asthma attack. You should use your short-acting rescue inhalers for this purpose. If your symptoms get worse or if you need your  short-acting inhalers more often, call your doctor right away. This medicine may increase your risk of getting an  infection. Tell your doctor or health care professional if you are around anyone with measles or chickenpox, or if you develop sores or blisters that do not heal properly. What side effects may I notice from receiving this medicine? Side effects that you should report to your doctor or health care professional as soon as possible: -allergic reactions like skin rash or hives, swelling of the face, lips, or tongue -changes in vision -chest pain -feeling faint or lightheaded, falls -fever or chills -irregular heartbeat Side effects that usually do not require medical attention (report to your doctor or health care professional if they continue or are bothersome): -coughing, hoarseness, throat irritation -headache -nervousness -stomach problems -stuffy nose -tremors This list may not describe all possible side effects. Call your doctor for medical advice about side effects. You may report side effects to FDA at 1-800-FDA-1088. Where should I keep my medicine? Keep out of the reach of children. Store at room temperature between 15 and 30 degrees C (59 and 86 degrees F). Store inhaler with the mouthpiece down. Throw away the inhaler when the dose indicator reads 000, or after the expiration date, whichever comes first. NOTE: This sheet is a summary. It may not cover all possible information. If you have questions about this medicine, talk to your doctor, pharmacist, or health care provider.  2018 Elsevier/Gold Standard (2016-02-14 15:38:46)

## 2016-07-24 NOTE — Progress Notes (Signed)
Patient c/o of fatigue and mild dyspnea with exertion.  Pt states that he often becomes short of breath when he is standing in the shower or with minimal exertion. Sats. 93-94% RA

## 2016-07-24 NOTE — Assessment & Plan Note (Addendum)
#   METASTATIC RENAL CELL CA s/p resection of LUL lung nodule; stage IV [ right lower lobe lung nodule]. MARCH 7 th 2018- CT C/A/P- NED/CR [ ~RUL 42mm/ improving- resolved; no new nodules].    # Currently on  sutent 37.5 mg 2 week -ON and 1 week OFF; patient tolerating treatment poorly- severe fatigue/worsening shortness of breath. I would recommend discontinuing Sutent at this time.  # Long discussion the patient that given the good risk-features Of his RCC/ also recent March 2018 CT scan that showed NED; and in general his poor tolerance to therapy- I think it's reasonable to discontinue treatment at this time. A surveillance with close scans every 3 months or so is reasonable. Patient is agreeable  # COPD/smoking- Recommend Advair.  # Scleroderma-/stable to 5 mg Norvasc; followed by rheumatology.  #Follow-up in mid-August of 2018; CT scan prior.

## 2016-07-24 NOTE — Progress Notes (Signed)
[place] NOTE  Patient Care Team: Leone Haven, MD as PCP - General (Family Medicine)  CHIEF COMPLAINTS/PURPOSE OF CONSULTATION:   Oncology History   # JULY 2017 METASTATIC RCC [s/p LUL lung section;Dr.Oaks]; RLL- nodule ~62mm  # June 2017- ? Frontal lesion on CT [cannot have MRI]  # RIGHT KIDNEY CANCER [incidental 2012; diverticulitis] pT3a (4.3x4.3x 3.2cm) [facility]- clear cell G-2; Neg margins; May 2012 ]  # July 20th- PAZOPANIB 4 pills/day; Discontinued sec to Elevated LFTs.   # OCT 2017- Start Sunitinib 2w-On; 1 w-OFF; CT NOV 13th- RLL- Improved; no new disease. MARCH 7th CT- CR.   # ? Scleroderma [Almodipine; Dr.Kernodle]- CONTRAINDICATION to IMMUNOTHERAPY.      Primary cancer of right kidney with metastasis from kidney to other site [facility])   08/30/2015 Initial Diagnosis    Primary cancer of right kidney with metastasis from kidney to other site [facility])        HISTORY OF PRESENTING ILLNESS:  Thomas Le 61 y.o.  male a very pleasant With metastatic renal cell carcinoma to the lung status post resection- currently on Sutent is here for follow-up. Patient's most recent CT scan in March 2018-NED.   Patient was restarted back on Sutent approximately month ago. Patient is on 37.5 mg 2 weeks on and one-week off schedule.  Patient noticed to have worsening fatigue in the last few days.  He is currently in the off week.   He notices to have worsening shortness of breath especially exertion. Admits to cough in the mornings. Unfortunately continues to smoke; even though he is trying to cut down. He is under lot of social stress. Also feels his  scleroderma flared up - with increasing skin lesions on his  legs and also on the face; hands.   ROS: A complete 10 point review of system is done which is negative except mentioned above in history of present illness  MEDICAL HISTORY:  Past Medical History:  Diagnosis Date  . Allergic rhinitis   . Anxiety   .  Arthritis   . Asthma   . Chronic bronchitis ([place])   . Colitis    Had blood in his stool with this and treated in the hospital  . Colitis cystica profunda   . Colon polyps   . Complication of anesthesia    when waking up get claustophobic with mask, anxiety, panic  . Depression   . Diverticulitis   . ED (erectile dysfunction)   . Genital herpes   . Hypertension   . Kidney disease   . Medical history non-contributory   . Occipital lymphadenopathy   . Peripheral vascular disease ([place])   . Renal cancer ([place])   . Shortness of breath dyspnea     SURGICAL HISTORY: Past Surgical History:  Procedure Laterality Date  . APPENDECTOMY  1966  . COLON SURGERY    . KIDNEY SURGERY Right 2012   Nephrectomy  . PARTIAL COLECTOMY  2012   For perforated colon related to diverticulitis- Dr. Marina Gravel  . VASECTOMY  2000  . VIDEO ASSISTED THORACOSCOPY (VATS)/THOROCOTOMY Left 08/13/2015   Procedure: LEFT THOROCOTOMY WITH LEFT UPPER LOBECTOMY, PREOP BRONCHOSCOPY;  Surgeon: Nestor Lewandowsky, MD;  Location: [facility] ORS;  Service: General;  Laterality: Left;    SOCIAL HISTORY: Social History   Social History  . Marital status: Married    Spouse name: N/A  . Number of children: N/A  . Years of education: N/A   Occupational History  . Not on file.  Social History Main Topics  . Smoking status: Current Every Day Smoker    Packs/day: 0.25    Years: 30.00    Types: Cigarettes  . Smokeless tobacco: Never Used  . Alcohol use 3.0 oz/week    5 Cans of beer per week     Comment: 5 beers a night   . Drug use: No  . Sexual activity: Yes   Other Topics Concern  . Not on file   Social History Narrative  . No narrative on file    FAMILY HISTORY: Family History  Problem Relation Age of Onset  . Arthritis/Rheumatoid Unknown        Parent  . Heart disease Unknown        Grandparent  . Alcoholism Unknown        Other relative  . Arthritis Mother        Rhematoid    ALLERGIES:  has No Known  Allergies.  MEDICATIONS:  Current Outpatient Prescriptions  Medication Sig Dispense Refill  . amLODipine (NORVASC) 5 MG tablet Take 5 mg by mouth daily.     Marland Kitchen ibuprofen (ADVIL,MOTRIN) 200 MG tablet Take 1 tablet by mouth every 6 (six) hours as needed.    . loperamide (IMODIUM A-D) 2 MG tablet Take 2 mg by mouth 4 (four) times daily as needed for diarrhea or loose stools.    . meloxicam (MOBIC) 7.5 MG tablet Take 1 tablet by mouth daily.    . SUNItinib (SUTENT) 37.5 MG capsule 2 weeks ON; 1 week OFF (Patient taking differently: Take 37.5 mg by mouth daily. 2 weeks ON; 1 week OFF) 14 capsule 6  . Fluticasone-Salmeterol (ADVAIR DISKUS) 500-50 MCG/DOSE AEPB Inhale 1 puff into the lungs 2 (two) times daily. 1 each 3  . VIAGRA 50 MG tablet TAKE 2 TABLETS (100 MG TOTAL) BY MOUTH DAILY AS NEEDED FOR ERECTILE DYSFUNCTION.  0   No current facility-administered medications for this visit.       Marland Kitchen  PHYSICAL EXAMINATION: ECOG PERFORMANCE STATUS: 0 - Asymptomatic  Vitals:   07/24/16 1421  BP: 111/69  Pulse: 67  Resp: 18  Temp: 97.8 F (36.6 C)   Filed Weights   07/24/16 1421  Weight: 158 lb 12.8 oz (72 kg)    GENERAL: Thin built moderately nourished lert, no distress and comfortable. He is alone.  He feels better.  EYES: no pallor or icterus OROPHARYNX: no thrush or ulceration; good dentition  NECK: supple, no masses felt LYMPH:  no palpable lymphadenopathy in the cervical, axillary or inguinal regions LUNGS: clear to auscultation and  No wheeze or crackles HEART/CVS: regular rate & rhythm and no murmurs; No lower extremity edema ABDOMEN: abdomen soft, non-tender and normal bowel sounds Musculoskeletal:no cyanosis of digits and no clubbing; purplish discoloration of finger tips/ toes [chronic] PSYCH: alert & oriented x 3 with fluent speech NEURO: no focal motor/sensory deficits Skin - papular-like lesions noted face / legs    LABORATORY DATA:  I have reviewed the data as  listed Lab Results  Component Value Date   WBC 10.5 07/24/2016   HGB 16.5 07/24/2016   HCT 48.0 07/24/2016   MCV 101.8 (H) 07/24/2016   PLT 192 07/24/2016    Recent Labs  11/15/15 0933 11/22/15 1034 11/29/15 1115  05/30/16 1350 06/27/16 1355 07/24/16 1357  NA  --   --   --   < > 137 137 138  K  --   --   --   < > 4.0  4.3 4.2  CL  --   --   --   < > 105 105 105  CO2  --   --   --   < > 25 25 28   GLUCOSE  --   --   --   < > 102* 82 106*  BUN  --   --   --   < > 21* 19 19  CREATININE  --   --   --   < > 1.12 0.97 1.27*  CALCIUM  --   --   --   < > 8.9 9.1 9.3  GFRNONAA  --   --   --   < > >60 >60 59*  GFRAA  --   --   --   < > >60 >60 >60  PROT 7.3 7.2 6.5  < > 6.8 7.1 6.9  ALBUMIN 4.1 4.0 3.6  < > 3.8 4.0 3.9  AST 64* 77* 43*  < > 19 19 24   ALT 63 74* 38  < > 9* 10* 12*  ALKPHOS 71 70 57  < > 48 56 64  BILITOT 1.6* 1.6* 0.4  < > 0.5 0.6 0.9  BILIDIR 0.2 0.3 0.1  --   --   --   --   IBILI 1.4* 1.3* 0.3  --   --   --   --   < > = values in this interval not displayed.from a value  RADIOGRAPHIC STUDIES: I have personally reviewed the radiological images as listed and agreed with the findings in the report. No results found.  ASSESSMENT & PLAN:   Primary cancer of right kidney with metastasis from kidney to other site [facility]) # METASTATIC RENAL CELL CA s/p resection of LUL lung nodule; stage IV [ right lower lobe lung nodule]. MARCH 7 th 2018- CT C/A/P- NED/CR [ ~RUL 62mm/ improving- resolved; no new nodules].    # Currently on  sutent 37.5 mg 2 week -ON and 1 week OFF; patient tolerating treatment poorly- severe fatigue/worsening shortness of breath. I would recommend discontinuing Sutent at this time.  # Long discussion the patient that given the good risk-features Of his RCC/ also recent March 2018 CT scan that showed NED; and in general his poor tolerance to therapy- I think it's reasonable to discontinue treatment at this time. A surveillance with close scans every 3  months or so is reasonable. Patient is agreeable  # COPD/smoking- Recommend Advair.  # Scleroderma-/stable to 5 mg Norvasc; followed by rheumatology.  #Follow-up in mid-August of 2018; CT scan prior.     Cammie Sickle, MD 07/24/2016 8:53 PM

## 2016-09-09 ENCOUNTER — Telehealth: Payer: Self-pay | Admitting: *Deleted

## 2016-09-09 NOTE — Telephone Encounter (Signed)
Pt left vm on 7/16 at 1600-requesting dental clearance. Needs to have a dental procedure on Thursday . rn spoke with Dr. Rogue Bussing pt has been off Sutent since May. Ok to proceed with dental procedure on Thursday. I called and left a return phone msg.  Informed pt that md was ok with pt proceeding with dental procedure. I asked pt to return my ph call if he needs a letter sent to the dentist.

## 2016-09-29 ENCOUNTER — Ambulatory Visit
Admission: RE | Admit: 2016-09-29 | Discharge: 2016-09-29 | Disposition: A | Discharge status: Home / Self Care | Payer: BLUE CROSS/BLUE SHIELD | Source: Ambulatory Visit | Attending: Internal Medicine | Admitting: Internal Medicine

## 2016-09-29 DIAGNOSIS — I700 Atherosclerosis of aorta: Secondary | ICD-10-CM | POA: Insufficient documentation

## 2016-09-29 DIAGNOSIS — Z902 Acquired absence of lung [part of]: Secondary | ICD-10-CM | POA: Diagnosis not present

## 2016-09-29 DIAGNOSIS — C641 Malignant neoplasm of right kidney, except renal pelvis: Secondary | ICD-10-CM | POA: Diagnosis not present

## 2016-09-29 DIAGNOSIS — Z9889 Other specified postprocedural states: Secondary | ICD-10-CM | POA: Insufficient documentation

## 2016-09-29 DIAGNOSIS — D3502 Benign neoplasm of left adrenal gland: Secondary | ICD-10-CM | POA: Insufficient documentation

## 2016-09-29 DIAGNOSIS — D3501 Benign neoplasm of right adrenal gland: Secondary | ICD-10-CM | POA: Insufficient documentation

## 2016-09-29 DIAGNOSIS — Z905 Acquired absence of kidney: Secondary | ICD-10-CM | POA: Diagnosis not present

## 2016-09-29 DIAGNOSIS — I2510 Atherosclerotic heart disease of native coronary artery without angina pectoris: Secondary | ICD-10-CM | POA: Diagnosis not present

## 2016-10-01 ENCOUNTER — Inpatient Hospital Stay: Payer: BLUE CROSS/BLUE SHIELD | Attending: Internal Medicine | Admitting: Internal Medicine

## 2016-10-01 ENCOUNTER — Inpatient Hospital Stay: Payer: BLUE CROSS/BLUE SHIELD

## 2016-10-01 VITALS — BP 127/73 | HR 71 | Temp 97.6°F | Resp 16 | Wt 160.2 lb

## 2016-10-01 DIAGNOSIS — K611 Rectal abscess: Secondary | ICD-10-CM

## 2016-10-01 DIAGNOSIS — Z905 Acquired absence of kidney: Secondary | ICD-10-CM | POA: Diagnosis not present

## 2016-10-01 DIAGNOSIS — Z79899 Other long term (current) drug therapy: Secondary | ICD-10-CM

## 2016-10-01 DIAGNOSIS — J449 Chronic obstructive pulmonary disease, unspecified: Secondary | ICD-10-CM | POA: Diagnosis not present

## 2016-10-01 DIAGNOSIS — I739 Peripheral vascular disease, unspecified: Secondary | ICD-10-CM

## 2016-10-01 DIAGNOSIS — I700 Atherosclerosis of aorta: Secondary | ICD-10-CM | POA: Diagnosis not present

## 2016-10-01 DIAGNOSIS — R7989 Other specified abnormal findings of blood chemistry: Secondary | ICD-10-CM

## 2016-10-01 DIAGNOSIS — I10 Essential (primary) hypertension: Secondary | ICD-10-CM

## 2016-10-01 DIAGNOSIS — F418 Other specified anxiety disorders: Secondary | ICD-10-CM | POA: Diagnosis not present

## 2016-10-01 DIAGNOSIS — F17210 Nicotine dependence, cigarettes, uncomplicated: Secondary | ICD-10-CM | POA: Diagnosis not present

## 2016-10-01 DIAGNOSIS — C7802 Secondary malignant neoplasm of left lung: Secondary | ICD-10-CM

## 2016-10-01 DIAGNOSIS — M349 Systemic sclerosis, unspecified: Secondary | ICD-10-CM

## 2016-10-01 DIAGNOSIS — Z86010 Personal history of colonic polyps: Secondary | ICD-10-CM | POA: Diagnosis not present

## 2016-10-01 DIAGNOSIS — C641 Malignant neoplasm of right kidney, except renal pelvis: Secondary | ICD-10-CM

## 2016-10-01 DIAGNOSIS — Z85528 Personal history of other malignant neoplasm of kidney: Secondary | ICD-10-CM

## 2016-10-01 DIAGNOSIS — R918 Other nonspecific abnormal finding of lung field: Secondary | ICD-10-CM | POA: Diagnosis not present

## 2016-10-01 DIAGNOSIS — R5383 Other fatigue: Secondary | ICD-10-CM | POA: Diagnosis not present

## 2016-10-01 LAB — CBC WITH DIFFERENTIAL/PLATELET
BASOS ABS: 0.1 10*3/uL (ref 0–0.1)
BASOS PCT: 1 %
EOS PCT: 4 %
Eosinophils Absolute: 0.3 10*3/uL (ref 0–0.7)
HEMATOCRIT: 48.5 % (ref 40.0–52.0)
Hemoglobin: 16.4 g/dL (ref 13.0–18.0)
Lymphocytes Relative: 19 %
Lymphs Abs: 1.7 10*3/uL (ref 1.0–3.6)
MCH: 33.9 pg (ref 26.0–34.0)
MCHC: 33.9 g/dL (ref 32.0–36.0)
MCV: 100.2 fL — AB (ref 80.0–100.0)
MONO ABS: 1.1 10*3/uL — AB (ref 0.2–1.0)
Monocytes Relative: 12 %
Neutro Abs: 5.8 10*3/uL (ref 1.4–6.5)
Neutrophils Relative %: 64 %
Platelets: 226 10*3/uL (ref 150–440)
RBC: 4.84 MIL/uL (ref 4.40–5.90)
RDW: 15.7 % — AB (ref 11.5–14.5)
WBC: 9.1 10*3/uL (ref 3.8–10.6)

## 2016-10-01 LAB — COMPREHENSIVE METABOLIC PANEL
ALBUMIN: 3.8 g/dL (ref 3.5–5.0)
ALT: 10 U/L — ABNORMAL LOW (ref 17–63)
AST: 20 U/L (ref 15–41)
Alkaline Phosphatase: 61 U/L (ref 38–126)
Anion gap: 9 (ref 5–15)
BILIRUBIN TOTAL: 0.7 mg/dL (ref 0.3–1.2)
BUN: 14 mg/dL (ref 6–20)
CHLORIDE: 104 mmol/L (ref 101–111)
CO2: 26 mmol/L (ref 22–32)
Calcium: 9.2 mg/dL (ref 8.9–10.3)
Creatinine, Ser: 1.06 mg/dL (ref 0.61–1.24)
GFR calc Af Amer: >60 mL/min (ref >60)
Glucose, Bld: 99 mg/dL (ref 65–99)
POTASSIUM: 4.1 mmol/L (ref 3.5–5.1)
Sodium: 139 mmol/L (ref 135–145)
TOTAL PROTEIN: 6.9 g/dL (ref 6.5–8.1)

## 2016-10-01 NOTE — Assessment & Plan Note (Addendum)
#   METASTATIC RENAL CELL CA- GOOD RISK- s/p resection of LUL lung nodule; stage IV [ right lower lobe lung nodule]. Patient most recently on Sutent since Oct 2017; currently on HOLD because of intolerance/adverse events [C discussion below] AUG 3rd 2018- CT C/A/P- NED/CR.   # Given the absence of any visible disease on the CT scan at this site- I would recommend his continuation of Sutent given the multiple side effects. Would recommend surveillance CT scans every 3-4 months.  # peri-rectal abcess-recommend evaluation with surgery; declines at this time. As patient to call if needed for anti-biotics. Previously responded to Augmentin   # COPD/smoking- Recommend Advair.  # Scleroderma-/stable to 5 mg Norvasc; followed by rheumatology.  # follow up with 2 months/ will order CT scan at that time. Labs- cbc/cmp/ldh.

## 2016-10-01 NOTE — Progress Notes (Signed)
[place] NOTE  Patient Care Team: Leone Haven, MD as PCP - General (Family Medicine)  CHIEF COMPLAINTS/PURPOSE OF CONSULTATION:   Oncology History   # JULY 2017 METASTATIC RCC- GOOD RISK [s/p LUL lung section;Dr.Oaks]; RLL- nodule ~25mm  # June 2017- ? Frontal lesion on CT [cannot have MRI]  # RIGHT KIDNEY CANCER [incidental 2012; diverticulitis] pT3a (4.3x4.3x 3.2cm) [facility]- clear cell G-2; Neg margins; May 2012 ]  # July 20th- PAZOPANIB 4 pills/day; Discontinued sec to Elevated LFTs.   # OCT 2017- Start Sunitinib 2w-On; 1 w-OFF; CT NOV 13th- RLL- Improved; no new disease. MARCH 7th CT- CR. SUTENT-HOLD [since July 2018]  # ? Scleroderma [Almodipine; Dr.Kernodle]- CONTRAINDICATION to IMMUNOTHERAPY.      Primary cancer of right kidney with metastasis from kidney to other site [facility])     HISTORY OF PRESENTING ILLNESS:  LES LONGMORE 61 y.o.  male a very pleasant With metastatic renal cell carcinoma to the lung status post resection- currently on Sutent is here for follow-up/ To review the results of his restaging CAT scan.  Patient Sutent is on hold for the last 4 weeks- because of poor tolerance.   Patient noted to have improvement of his fatigue since holding the Sutent. His shortness of breath is stable. Unfortunately continues to smoke. He is following up with pulmonary- another month.  In the interim patient had a rectal abscess that spontaneously drained. States to have improved after using topical antibiotic cream. Scleroderma stable.  ROS: A complete 10 point review of system is done which is negative except mentioned above in history of present illness  MEDICAL HISTORY:  Past Medical History:  Diagnosis Date  . Allergic rhinitis   . Anxiety   . Arthritis   . Asthma   . Chronic bronchitis ([place])   . Colitis    Had blood in his stool with this and treated in the hospital  . Colitis cystica profunda   . Colon polyps   . Complication of  anesthesia    when waking up get claustophobic with mask, anxiety, panic  . Depression   . Diverticulitis   . ED (erectile dysfunction)   . Genital herpes   . Hypertension   . Kidney disease   . Medical history non-contributory   . Occipital lymphadenopathy   . Peripheral vascular disease ([place])   . Renal cancer ([place])   . Shortness of breath dyspnea     SURGICAL HISTORY: Past Surgical History:  Procedure Laterality Date  . APPENDECTOMY  1966  . COLON SURGERY    . KIDNEY SURGERY Right 2012   Nephrectomy  . PARTIAL COLECTOMY  2012   For perforated colon related to diverticulitis- Dr. Marina Gravel  . VASECTOMY  2000  . VIDEO ASSISTED THORACOSCOPY (VATS)/THOROCOTOMY Left 08/13/2015   Procedure: LEFT THOROCOTOMY WITH LEFT UPPER LOBECTOMY, PREOP BRONCHOSCOPY;  Surgeon: Nestor Lewandowsky, MD;  Location: [facility] ORS;  Service: General;  Laterality: Left;    SOCIAL HISTORY: Social History   Social History  . Marital status: Married    Spouse name: N/A  . Number of children: N/A  . Years of education: N/A   Occupational History  . Not on file.   Social History Main Topics  . Smoking status: Current Every Day Smoker    Packs/day: 0.25    Years: 30.00    Types: Cigarettes  . Smokeless tobacco: Never Used  . Alcohol use 3.0 oz/week    5 Cans of beer per week  Comment: 5 beers a night   . Drug use: No  . Sexual activity: Yes   Other Topics Concern  . Not on file   Social History Narrative  . No narrative on file    FAMILY HISTORY: Family History  Problem Relation Age of Onset  . Arthritis/Rheumatoid Unknown        Parent  . Heart disease Unknown        Grandparent  . Alcoholism Unknown        Other relative  . Arthritis Mother        Rhematoid    ALLERGIES:  has No Known Allergies.  MEDICATIONS:  Current Outpatient Prescriptions  Medication Sig Dispense Refill  . amLODipine (NORVASC) 5 MG tablet Take 5 mg by mouth daily.     . Fluticasone-Salmeterol (ADVAIR DISKUS)  500-50 MCG/DOSE AEPB Inhale 1 puff into the lungs 2 (two) times daily. 1 each 3  . ibuprofen (ADVIL,MOTRIN) 200 MG tablet Take 1 tablet by mouth every 6 (six) hours as needed.    . loperamide (IMODIUM A-D) 2 MG tablet Take 2 mg by mouth 4 (four) times daily as needed for diarrhea or loose stools.    . meloxicam (MOBIC) 7.5 MG tablet Take 1 tablet by mouth daily.    Marland Kitchen VIAGRA 50 MG tablet TAKE 2 TABLETS (100 MG TOTAL) BY MOUTH DAILY AS NEEDED FOR ERECTILE DYSFUNCTION.  0  . SUNItinib (SUTENT) 37.5 MG capsule 2 weeks ON; 1 week OFF (Patient not taking: Reported on 10/01/2016) 14 capsule 6   No current facility-administered medications for this visit.       Marland Kitchen  PHYSICAL EXAMINATION: ECOG PERFORMANCE STATUS: 0 - Asymptomatic  Vitals:   10/01/16 1412  BP: 127/73  Pulse: 71  Resp: 16  Temp: 97.6 F (36.4 C)   Filed Weights   10/01/16 1412  Weight: 160 lb 3.2 oz (72.7 kg)    GENERAL: Thin built moderately nourished lert, no distress and comfortable. He is alone.  He feels better.  EYES: no pallor or icterus OROPHARYNX: no thrush or ulceration; good dentition  NECK: supple, no masses felt LYMPH:  no palpable lymphadenopathy in the cervical, axillary or inguinal regions LUNGS: clear to auscultation and  No wheeze or crackles HEART/CVS: regular rate & rhythm and no murmurs; No lower extremity edema ABDOMEN: abdomen soft, non-tender and normal bowel sounds Musculoskeletal:no cyanosis of digits and no clubbing; purplish discoloration of finger tips/ toes [chronic] PSYCH: alert & oriented x 3 with fluent speech NEURO: no focal motor/sensory deficits Skin - papular-like lesions noted face / legs    LABORATORY DATA:  I have reviewed the data as listed Lab Results  Component Value Date   WBC 9.1 10/01/2016   HGB 16.4 10/01/2016   HCT 48.5 10/01/2016   MCV 100.2 (H) 10/01/2016   PLT 226 10/01/2016    Recent Labs  11/15/15 0933 11/22/15 1034 11/29/15 1115  06/27/16 1355  07/24/16 1357 10/01/16 1342  NA  --   --   --   < > 137 138 139  K  --   --   --   < > 4.3 4.2 4.1  CL  --   --   --   < > 105 105 104  CO2  --   --   --   < > 25 28 26   GLUCOSE  --   --   --   < > 82 106* 99  BUN  --   --   --   < >  19 19 14   CREATININE  --   --   --   < > 0.97 1.27* 1.06  CALCIUM  --   --   --   < > 9.1 9.3 9.2  GFRNONAA  --   --   --   < > >60 59* >60  GFRAA  --   --   --   < > >60 >60 >60  PROT 7.3 7.2 6.5  < > 7.1 6.9 6.9  ALBUMIN 4.1 4.0 3.6  < > 4.0 3.9 3.8  AST 64* 77* 43*  < > 19 24 20   ALT 63 74* 38  < > 10* 12* 10*  ALKPHOS 71 70 57  < > 56 64 61  BILITOT 1.6* 1.6* 0.4  < > 0.6 0.9 0.7  BILIDIR 0.2 0.3 0.1  --   --   --   --   IBILI 1.4* 1.3* 0.3  --   --   --   --   < > = values in this interval not displayed.from a value  RADIOGRAPHIC STUDIES: I have personally reviewed the radiological images as listed and agreed with the findings in the report. Ct Chest Wo Contrast  Result Date: 09/29/2016 CLINICAL DATA:  Right kidney cancer with metastatic disease. EXAM: CT CHEST WITHOUT CONTRAST TECHNIQUE: Multidetector CT imaging of the chest was performed following the standard protocol without IV contrast. COMPARISON:  04/30/2016. FINDINGS: Cardiovascular: Atherosclerotic calcification of the arterial vasculature, including three-vessel involvement of the coronary arteries. Heart size normal. No pericardial effusion. Mediastinum/Nodes: Low left paratracheal lymph node measures 10 mm, increased from 6 mm, nonspecific. Mediastinal lymph nodes are otherwise subcentimeter in short axis size. Hilar regions are difficult to evaluate without IV contrast. There is a 9 mm left hilar lymph node, as before. No axillary adenopathy. Esophagus is grossly unremarkable. Lungs/Pleura: 3 mm posterior segment right upper lobe nodule, stable. Left upper lobectomy. Lungs are otherwise clear. No pleural fluid. Airway is otherwise unremarkable. Upper Abdomen: 1.4 cm low-attenuation lesion in  the left hepatic lobe is unchanged. Visualized portions of the liver and gallbladder are otherwise unremarkable. 10 mm fluid density nodule in the right adrenal gland and 3.0 cm fluid density lesion in the left adrenal gland, as before. Right nephrectomy. Visualized portions of the left kidney, spleen, pancreas, stomach and bowel are grossly unremarkable. Musculoskeletal: Degenerative changes in the spine. IMPRESSION: 1. Right nephrectomy and left upper lobectomy. No evidence of metastatic disease. 2. Aortic atherosclerosis (ICD10-170.0). Three-vessel coronary artery calcification. 3. Bilateral adrenal adenomas. Electronically Signed   By: Lorin Picket M.D.   On: 09/29/2016 13:54   IMPRESSION: 1. Right nephrectomy and left upper lobectomy. No evidence of metastatic disease. 2. Aortic atherosclerosis (ICD10-170.0). Three-vessel coronary artery calcification. 3. Bilateral adrenal adenomas.   Electronically Signed   By: Lorin Picket M.D.   On: 09/29/2016 13:54   ASSESSMENT & PLAN:   Primary cancer of right kidney with metastasis from kidney to other site [facility]) # METASTATIC RENAL CELL CA- GOOD RISK- s/p resection of LUL lung nodule; stage IV [ right lower lobe lung nodule]. Patient most recently on Sutent since Oct 2017; currently on HOLD because of intolerance/adverse events [C discussion below] AUG 3rd 2018- CT C/A/P- NED/CR.   # Given the absence of any visible disease on the CT scan at this site- I would recommend his continuation of Sutent given the multiple side effects. Would recommend surveillance CT scans every 3-4 months.  # peri-rectal abcess-recommend evaluation with surgery;  declines at this time. As patient to call if needed for anti-biotics. Previously responded to Augmentin   # COPD/smoking- Recommend Advair.  # Scleroderma-/stable to 5 mg Norvasc; followed by rheumatology.  # follow up with 2 months/ will order CT scan at that time. Labs- cbc/cmp/ldh.      Cammie Sickle, MD 10/02/2016 8:19 AM

## 2016-10-02 ENCOUNTER — Telehealth: Payer: Self-pay | Admitting: Internal Medicine

## 2016-10-02 DIAGNOSIS — C641 Malignant neoplasm of right kidney, except renal pelvis: Secondary | ICD-10-CM

## 2016-10-02 NOTE — Addendum Note (Signed)
Addended by: Sabino Gasser on: 10/02/2016 08:50 AM   Modules accepted: Orders

## 2016-10-02 NOTE — Addendum Note (Signed)
Addended by: Sandria Bales B on: 10/02/2016 08:29 AM   Modules accepted: Orders

## 2016-10-02 NOTE — Telephone Encounter (Signed)
#   follow up with 2 months/MD Labs- cbc/cmp/ldh

## 2016-10-02 NOTE — Telephone Encounter (Signed)
Lab orders entered per md order and msg sent to sch. Teams to arrange apts.

## 2016-11-28 ENCOUNTER — Other Ambulatory Visit: Payer: Self-pay | Admitting: Internal Medicine

## 2016-11-28 DIAGNOSIS — C641 Malignant neoplasm of right kidney, except renal pelvis: Secondary | ICD-10-CM

## 2016-12-01 ENCOUNTER — Inpatient Hospital Stay: Payer: BLUE CROSS/BLUE SHIELD

## 2016-12-01 ENCOUNTER — Inpatient Hospital Stay: Payer: BLUE CROSS/BLUE SHIELD | Attending: Internal Medicine | Admitting: Internal Medicine

## 2016-12-01 VITALS — BP 137/76 | HR 64 | Temp 97.6°F | Resp 18 | Ht 73.0 in | Wt 154.6 lb

## 2016-12-01 DIAGNOSIS — Z79899 Other long term (current) drug therapy: Secondary | ICD-10-CM | POA: Insufficient documentation

## 2016-12-01 DIAGNOSIS — C641 Malignant neoplasm of right kidney, except renal pelvis: Secondary | ICD-10-CM

## 2016-12-01 DIAGNOSIS — F418 Other specified anxiety disorders: Secondary | ICD-10-CM

## 2016-12-01 DIAGNOSIS — I739 Peripheral vascular disease, unspecified: Secondary | ICD-10-CM | POA: Diagnosis not present

## 2016-12-01 DIAGNOSIS — C7802 Secondary malignant neoplasm of left lung: Secondary | ICD-10-CM | POA: Insufficient documentation

## 2016-12-01 DIAGNOSIS — Z902 Acquired absence of lung [part of]: Secondary | ICD-10-CM | POA: Diagnosis not present

## 2016-12-01 DIAGNOSIS — J449 Chronic obstructive pulmonary disease, unspecified: Secondary | ICD-10-CM | POA: Diagnosis not present

## 2016-12-01 DIAGNOSIS — I731 Thromboangiitis obliterans [Buerger's disease]: Secondary | ICD-10-CM | POA: Diagnosis not present

## 2016-12-01 DIAGNOSIS — D751 Secondary polycythemia: Secondary | ICD-10-CM | POA: Diagnosis not present

## 2016-12-01 DIAGNOSIS — Z86010 Personal history of colonic polyps: Secondary | ICD-10-CM | POA: Insufficient documentation

## 2016-12-01 DIAGNOSIS — Z7982 Long term (current) use of aspirin: Secondary | ICD-10-CM | POA: Insufficient documentation

## 2016-12-01 DIAGNOSIS — I10 Essential (primary) hypertension: Secondary | ICD-10-CM

## 2016-12-01 DIAGNOSIS — I700 Atherosclerosis of aorta: Secondary | ICD-10-CM | POA: Diagnosis not present

## 2016-12-01 DIAGNOSIS — Z905 Acquired absence of kidney: Secondary | ICD-10-CM

## 2016-12-01 DIAGNOSIS — F17210 Nicotine dependence, cigarettes, uncomplicated: Secondary | ICD-10-CM | POA: Diagnosis not present

## 2016-12-01 DIAGNOSIS — M1990 Unspecified osteoarthritis, unspecified site: Secondary | ICD-10-CM | POA: Insufficient documentation

## 2016-12-01 DIAGNOSIS — Z85528 Personal history of other malignant neoplasm of kidney: Secondary | ICD-10-CM | POA: Diagnosis not present

## 2016-12-01 DIAGNOSIS — M349 Systemic sclerosis, unspecified: Secondary | ICD-10-CM | POA: Diagnosis not present

## 2016-12-01 LAB — COMPREHENSIVE METABOLIC PANEL
ALBUMIN: 4 g/dL (ref 3.5–5.0)
ALT: 21 U/L (ref 17–63)
AST: 29 U/L (ref 15–41)
Alkaline Phosphatase: 71 U/L (ref 38–126)
Anion gap: 8 (ref 5–15)
BUN: 5 mg/dL — AB (ref 6–20)
CALCIUM: 9.3 mg/dL (ref 8.9–10.3)
CHLORIDE: 101 mmol/L (ref 101–111)
CO2: 28 mmol/L (ref 22–32)
CREATININE: 1.12 mg/dL (ref 0.61–1.24)
GFR calc Af Amer: >60 mL/min (ref >60)
Glucose, Bld: 92 mg/dL (ref 65–99)
Potassium: 4.6 mmol/L (ref 3.5–5.1)
Sodium: 137 mmol/L (ref 135–145)
Total Bilirubin: 0.7 mg/dL (ref 0.3–1.2)
Total Protein: 7.4 g/dL (ref 6.5–8.1)

## 2016-12-01 LAB — CBC WITH DIFFERENTIAL/PLATELET
BASOS ABS: 0.1 10*3/uL (ref 0–0.1)
BASOS PCT: 1 %
EOS PCT: 3 %
Eosinophils Absolute: 0.3 10*3/uL (ref 0–0.7)
HCT: 54 % — ABNORMAL HIGH (ref 40.0–52.0)
Hemoglobin: 18.1 g/dL — ABNORMAL HIGH (ref 13.0–18.0)
Lymphocytes Relative: 21 %
Lymphs Abs: 1.7 10*3/uL (ref 1.0–3.6)
MCH: 34.2 pg — ABNORMAL HIGH (ref 26.0–34.0)
MCHC: 33.6 g/dL (ref 32.0–36.0)
MCV: 101.7 fL — AB (ref 80.0–100.0)
MONO ABS: 0.9 10*3/uL (ref 0.2–1.0)
Monocytes Relative: 12 %
Neutro Abs: 5.2 10*3/uL (ref 1.4–6.5)
Neutrophils Relative %: 63 %
PLATELETS: 230 10*3/uL (ref 150–440)
RBC: 5.31 MIL/uL (ref 4.40–5.90)
RDW: 14.2 % (ref 11.5–14.5)
WBC: 8.1 10*3/uL (ref 3.8–10.6)

## 2016-12-01 LAB — LACTATE DEHYDROGENASE: LDH: 125 U/L (ref 98–192)
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
## 2016-12-03 ENCOUNTER — Other Ambulatory Visit: Payer: BLUE CROSS/BLUE SHIELD

## 2016-12-03 ENCOUNTER — Ambulatory Visit: Payer: BLUE CROSS/BLUE SHIELD | Admitting: Internal Medicine

## 2016-12-25 ENCOUNTER — Ambulatory Visit
Admission: RE | Admit: 2016-12-25 | Discharge: 2016-12-25 | Disposition: A | Discharge status: Home / Self Care | Payer: BLUE CROSS/BLUE SHIELD | Source: Ambulatory Visit | Attending: Nurse Practitioner | Admitting: Nurse Practitioner

## 2016-12-25 DIAGNOSIS — C641 Malignant neoplasm of right kidney, except renal pelvis: Secondary | ICD-10-CM | POA: Diagnosis present

## 2016-12-25 DIAGNOSIS — K769 Liver disease, unspecified: Secondary | ICD-10-CM | POA: Diagnosis not present

## 2016-12-25 DIAGNOSIS — I700 Atherosclerosis of aorta: Secondary | ICD-10-CM | POA: Diagnosis not present

## 2016-12-25 DIAGNOSIS — Z905 Acquired absence of kidney: Secondary | ICD-10-CM | POA: Diagnosis not present

## 2016-12-25 MED ORDER — IOPAMIDOL (ISOVUE-300) INJECTION 61%
75.0000 mL | Freq: Once | INTRAVENOUS | Status: AC | PRN
  Administered 2016-12-25: 75 mL via INTRAVENOUS

## 2016-12-29 ENCOUNTER — Inpatient Hospital Stay: Payer: BLUE CROSS/BLUE SHIELD

## 2016-12-29 ENCOUNTER — Inpatient Hospital Stay: Payer: BLUE CROSS/BLUE SHIELD | Attending: Internal Medicine | Admitting: Internal Medicine

## 2016-12-29 VITALS — BP 117/74 | HR 69 | Temp 97.8°F | Resp 20 | Ht 73.0 in | Wt 156.0 lb

## 2016-12-29 DIAGNOSIS — Z902 Acquired absence of lung [part of]: Secondary | ICD-10-CM

## 2016-12-29 DIAGNOSIS — Z905 Acquired absence of kidney: Secondary | ICD-10-CM | POA: Diagnosis not present

## 2016-12-29 DIAGNOSIS — Z85528 Personal history of other malignant neoplasm of kidney: Secondary | ICD-10-CM

## 2016-12-29 DIAGNOSIS — F17210 Nicotine dependence, cigarettes, uncomplicated: Secondary | ICD-10-CM

## 2016-12-29 DIAGNOSIS — C641 Malignant neoplasm of right kidney, except renal pelvis: Secondary | ICD-10-CM

## 2016-12-29 DIAGNOSIS — M349 Systemic sclerosis, unspecified: Secondary | ICD-10-CM | POA: Diagnosis not present

## 2016-12-29 DIAGNOSIS — R918 Other nonspecific abnormal finding of lung field: Secondary | ICD-10-CM | POA: Diagnosis not present

## 2016-12-29 DIAGNOSIS — I700 Atherosclerosis of aorta: Secondary | ICD-10-CM

## 2016-12-29 DIAGNOSIS — D751 Secondary polycythemia: Secondary | ICD-10-CM

## 2016-12-29 DIAGNOSIS — C7802 Secondary malignant neoplasm of left lung: Secondary | ICD-10-CM

## 2016-12-29 DIAGNOSIS — I10 Essential (primary) hypertension: Secondary | ICD-10-CM | POA: Diagnosis not present

## 2016-12-29 DIAGNOSIS — Z79899 Other long term (current) drug therapy: Secondary | ICD-10-CM | POA: Diagnosis not present

## 2016-12-29 DIAGNOSIS — I739 Peripheral vascular disease, unspecified: Secondary | ICD-10-CM | POA: Diagnosis not present

## 2016-12-29 DIAGNOSIS — J449 Chronic obstructive pulmonary disease, unspecified: Secondary | ICD-10-CM

## 2016-12-29 LAB — CBC WITH DIFFERENTIAL/PLATELET
BASOS ABS: 0.1 10*3/uL (ref 0–0.1)
BASOS PCT: 1 %
EOS ABS: 0.3 10*3/uL (ref 0–0.7)
Eosinophils Relative: 3 %
HCT: 48.9 % (ref 40.0–52.0)
HEMOGLOBIN: 16.5 g/dL (ref 13.0–18.0)
Lymphocytes Relative: 25 %
Lymphs Abs: 2.2 10*3/uL (ref 1.0–3.6)
MCH: 33.9 pg (ref 26.0–34.0)
MCHC: 33.8 g/dL (ref 32.0–36.0)
MCV: 100.3 fL — ABNORMAL HIGH (ref 80.0–100.0)
Monocytes Absolute: 0.7 10*3/uL (ref 0.2–1.0)
Monocytes Relative: 9 %
NEUTROS PCT: 62 %
Neutro Abs: 5.4 10*3/uL (ref 1.4–6.5)
PLATELETS: 245 10*3/uL (ref 150–440)
RBC: 4.88 MIL/uL (ref 4.40–5.90)
RDW: 14.2 % (ref 11.5–14.5)
WBC: 8.7 10*3/uL (ref 3.8–10.6)

## 2016-12-29 LAB — COMPREHENSIVE METABOLIC PANEL
ALK PHOS: 64 U/L (ref 38–126)
ALT: 12 U/L — AB (ref 17–63)
AST: 23 U/L (ref 15–41)
Albumin: 3.8 g/dL (ref 3.5–5.0)
Anion gap: 8 (ref 5–15)
BUN: 17 mg/dL (ref 6–20)
CALCIUM: 8.9 mg/dL (ref 8.9–10.3)
CHLORIDE: 103 mmol/L (ref 101–111)
CO2: 26 mmol/L (ref 22–32)
CREATININE: 1.23 mg/dL (ref 0.61–1.24)
Glucose, Bld: 108 mg/dL — ABNORMAL HIGH (ref 65–99)
Potassium: 4.1 mmol/L (ref 3.5–5.1)
Sodium: 137 mmol/L (ref 135–145)
TOTAL PROTEIN: 7.1 g/dL (ref 6.5–8.1)
Total Bilirubin: 0.7 mg/dL (ref 0.3–1.2)

## 2016-12-29 LAB — LACTATE DEHYDROGENASE: LDH: 127 U/L (ref 98–192)

## 2016-12-29 NOTE — Progress Notes (Signed)
Patient here for follow-up for renal cancer. Pt here to discuss ct scan results.

## 2016-12-29 NOTE — Progress Notes (Signed)
[place] NOTE  Patient Care Team: Leone Haven, MD as PCP - General (Family Medicine)  CHIEF COMPLAINTS/PURPOSE OF CONSULTATION:   Oncology History   # JULY 2017 METASTATIC RCC- GOOD RISK [s/p LUL lung section;Dr.Oaks]; RLL- nodule ~21mm  # June 2017- ? Frontal lesion on CT [cannot have MRI]  # RIGHT KIDNEY CANCER [incidental 2012; diverticulitis] pT3a (4.3x4.3x 3.2cm) [facility]- clear cell G-2; Neg margins; May 2012 ]  # July 20th- PAZOPANIB 4 pills/day; Discontinued sec to Elevated LFTs.   # OCT 2017- Start Sunitinib 2w-On; 1 w-OFF; CT NOV 13th- RLL- Improved; no new disease. MARCH 7th CT- CR. SUTENT-HOLD [since July 2018]  # ? Scleroderma [Almodipine; Dr.Kernodle]- CONTRAINDICATION to IMMUNOTHERAPY.      Primary cancer of right kidney with metastasis from kidney to other site [facility])     HISTORY OF PRESENTING ILLNESS:  Thomas Le 61 y.o.  male with metastatic renal cell carcinoma to the lung status post left upper lobectomy (08/13/15) and right nephrectomy (2012). He has previously taken Sutent but this was stopped in July 2018 due to poor tolerance including fatigue.    Patient states his breathing is better. He was evaluated by pulmonology recently.  he recently had PFTs done. He denies any headaches or vision changes.   ROS: A complete 10 point review of system is done which is negative except mentioned above in history of present illness  MEDICAL HISTORY:  Past Medical History:  Diagnosis Date  . Allergic rhinitis   . Anxiety   . Arthritis   . Asthma   . Chronic bronchitis ([place])   . Colitis    Had blood in his stool with this and treated in the hospital  . Colitis cystica profunda   . Colon polyps   . Complication of anesthesia    when waking up get claustophobic with mask, anxiety, panic  . Depression   . Diverticulitis   . ED (erectile dysfunction)   . Genital herpes   . Hypertension   . Kidney disease   . Medical history  non-contributory   . Occipital lymphadenopathy   . Peripheral vascular disease ([place])   . Renal cancer ([place])   . Shortness of breath dyspnea     SURGICAL HISTORY: Past Surgical History:  Procedure Laterality Date  . APPENDECTOMY  1966  . COLON SURGERY    . KIDNEY SURGERY Right 2012   Nephrectomy  . PARTIAL COLECTOMY  2012   For perforated colon related to diverticulitis- Dr. Marina Gravel  . VASECTOMY  2000    SOCIAL HISTORY: Social History   Socioeconomic History  . Marital status: Married    Spouse name: Not on file  . Number of children: Not on file  . Years of education: Not on file  . Highest education level: Not on file  Social Needs  . Financial resource strain: Not on file  . Food insecurity - worry: Not on file  . Food insecurity - inability: Not on file  . Transportation needs - medical: Not on file  . Transportation needs - non-medical: Not on file  Occupational History  . Not on file  Tobacco Use  . Smoking status: Current Every Day Smoker    Packs/day: 0.25    Years: 30.00    Pack years: 7.50    Types: Cigarettes  . Smokeless tobacco: Never Used  Substance and Sexual Activity  . Alcohol use: Yes    Alcohol/week: 3.0 oz    Types: 5 Cans  of beer per week    Comment: 5 beers a night   . Drug use: No  . Sexual activity: Yes  Other Topics Concern  . Not on file  Social History Narrative  . Not on file    FAMILY HISTORY: Family History  Problem Relation Age of Onset  . Arthritis/Rheumatoid Unknown        Parent  . Heart disease Unknown        Grandparent  . Alcoholism Unknown        Other relative  . Arthritis Mother        Rhematoid    ALLERGIES:  has No Known Allergies.  MEDICATIONS:  Current Outpatient Medications  Medication Sig Dispense Refill  . ADVAIR DISKUS 500-50 MCG/DOSE AEPB TAKE 1 PUFF BY MOUTH TWICE A DAY 60 each 3  . amLODipine (NORVASC) 5 MG tablet Take 5 mg by mouth daily.     Marland Kitchen ibuprofen (ADVIL,MOTRIN) 200 MG tablet Take 1  tablet by mouth every 6 (six) hours as needed.    . meloxicam (MOBIC) 7.5 MG tablet Take 1 tablet by mouth daily.    Marland Kitchen PROAIR HFA 108 (90 Base) MCG/ACT inhaler Inhale 2 puffs every 6 (six) hours as needed into the lungs for wheezing.   11  . loperamide (IMODIUM A-D) 2 MG tablet Take 2 mg by mouth 4 (four) times daily as needed for diarrhea or loose stools.    Marland Kitchen VIAGRA 50 MG tablet TAKE 2 TABLETS (100 MG TOTAL) BY MOUTH DAILY AS NEEDED FOR ERECTILE DYSFUNCTION.  0   No current facility-administered medications for this visit.     PHYSICAL EXAMINATION: ECOG PERFORMANCE STATUS: 0 - Asymptomatic  Vitals:   12/29/16 1550  BP: 117/74  Pulse: 69  Resp: 20  Temp: 97.8 F (36.6 C)   Filed Weights   12/29/16 1551  Weight: 156 lb (70.8 kg)    GENERAL: Thin built moderately nourished adult male, alone. EYES: no pallor or icterus OROPHARYNX: no thrush or ulceration; good dentition  NECK: supple, no masses felt LYMPH: no palpable cervical lymph nodes LUNGS: clear to auscultation and  No wheeze or crackles HEART/CVS: regular rate & rhythm and no murmurs; No lower extremity edema ABDOMEN: abdomen soft, non-tender and normal bowel sounds Musculoskeletal:no cyanosis of digits and no clubbing; purplish discoloration of finger tips/ toes [chronic] PSYCH: alert & oriented x 3 with fluent speech. better mood today. NEURO: no focal motor/sensory deficits   LABORATORY DATA:  I have reviewed the data as listed Lab Results  Component Value Date   WBC 8.7 12/29/2016   HGB 16.5 12/29/2016   HCT 48.9 12/29/2016   MCV 100.3 (H) 12/29/2016   PLT 245 12/29/2016   Recent Labs    10/01/16 1342 12/01/16 1130 12/29/16 1518  NA 139 137 137  K 4.1 4.6 4.1  CL 104 101 103  CO2 26 28 26   GLUCOSE 99 92 108*  BUN 14 5* 17  CREATININE 1.06 1.12 1.23  CALCIUM 9.2 9.3 8.9  GFRNONAA >60 >60 >60  GFRAA >60 >60 >60  PROT 6.9 7.4 7.1  ALBUMIN 3.8 4.0 3.8  AST 20 29 23   ALT 10* 21 12*  ALKPHOS 61 71  64  BILITOT 0.7 0.7 0.7  from a value  RADIOGRAPHIC STUDIES: I have personally reviewed the radiological images as listed and agreed with the findings in the report. Ct Chest W Contrast  Result Date: 12/25/2016 CLINICAL DATA:  Restaging metastatic renal carcinoma. EXAM: CT CHEST  WITH CONTRAST TECHNIQUE: Multidetector CT imaging of the chest was performed during intravenous contrast administration. CONTRAST:  66mL ISOVUE-300 IOPAMIDOL (ISOVUE-300) INJECTION 61% COMPARISON:  Chest CT 09/29/2016 FINDINGS: Cardiovascular: The heart is normal in size. No pericardial effusion. Stable mild tortuosity and calcification of the thoracic aorta. Stable three-vessel coronary artery calcifications. Mediastinum/Nodes: No mediastinal or hilar mass or adenopathy. Small scattered lymph nodes are stable. The esophagus is grossly normal. Lungs/Pleura: Stable surgical changes involving the left hemithorax from a prior left upper lobe lobectomy. Stable volume loss and mild shift of the heart and mediastinum to the left. No acute pulmonary findings. No infiltrates, edema or effusions. 3 mm right upper lobe pulmonary nodule on image number 85 is stable. No worrisome pulmonary lesions to suggest new metastatic disease. No pleural nodules. Upper Abdomen: Tight cluster of enhancing nodules in the right hepatic near the gallbladder. I do not see this on prior imaging studies almost none were without contrast. I suspect this is some type of vascular malformation. Metastatic disease is unlikely given its appearance. Recommend attention on future studies. Status post right nephrectomy. Advanced atherosclerotic calcifications involving aorta. Musculoskeletal: No chest wall mass, supraclavicular or axillary adenopathy. The thyroid gland appears normal. IMPRESSION: 1. Cluster of small enhancing nodules in the right hepatic lobe near the gallbladder most likely some type of vascular malformation. Attention on future scans is suggested. 2. No  findings for metastatic disease involving the chest. Stable surgical changes involving the left hemithorax. 3. No mediastinal or hilar mass or adenopathy. Aortic Atherosclerosis (ICD10-I70.0). Electronically Signed   By: Marijo Sanes M.D.   On: 12/25/2016 13:40   IMPRESSION: 1. Right nephrectomy and left upper lobectomy. No evidence of metastatic disease. 2. Aortic atherosclerosis (ICD10-170.0). Three-vessel coronary artery calcification. 3. Bilateral adrenal adenomas.  Electronically Signed   By: Lorin Picket M.D.   On: 09/29/2016 13:54   ASSESSMENT & PLAN:   Primary cancer of right kidney with metastasis from kidney to other site [facility]) # METASTATIC RENAL CELL CA- GOOD RISK- s/p resection of LUL lung nodule; stage IV [ right lower lobe lung nodule]. CT scan in November 2018-NED; vascular lesions noted in the liver suspicious for hemangioma rather than malignancy.  # We will continue surveillance at this time; continue to hold Sutent because of intolerance.   # COPD/smoking- Continues Advair. Unfortunately, continues to smoke.   # Scleroderma-/stable followed by rheumatology. No recent flareups.  #tobacco cessation- patient declines smoking cessation classes or medication. Denies wanting to stop smoking. Denies that smoking has or can negatively impact his health.  # Recent elevated hemoglobin at 18; likely secondary to smoking. Today hemoglobin is 16. Monitor for now. .   # follow up in 66months/labs- will order CT scan at that time.   # I reviewed the blood work- with the patient in detail; also reviewed the imaging independently [as summarized above]; and with the patient in detail.      Beckey Rutter, DNP AGNP-C 12/01/16 4:06 PM   Cammie Sickle, MD 12/29/2016 7:45 PM

## 2016-12-29 NOTE — Progress Notes (Signed)
Patient states that since last visit, his financial distress was relieved. He states that starting next month, he will be receiving a check for disability. Pt reports new Family social issues - he had to place his 62 yr old dad in assisted living due to medical issues.

## 2016-12-29 NOTE — Assessment & Plan Note (Addendum)
#   METASTATIC RENAL CELL CA- GOOD RISK- s/p resection of LUL lung nodule; stage IV [ right lower lobe lung nodule]. CT scan in November 2018-NED; vascular lesions noted in the liver suspicious for hemangioma rather than malignancy.  # We will continue surveillance at this time; continue to hold Sutent because of intolerance.   # COPD/smoking- Continues Advair. Unfortunately, continues to smoke.   # Scleroderma-/stable followed by rheumatology. No recent flareups.  #tobacco cessation- patient declines smoking cessation classes or medication. Denies wanting to stop smoking. Denies that smoking has or can negatively impact his health.  # Recent elevated hemoglobin at 18; likely secondary to smoking. Today hemoglobin is 16. Monitor for now. .   # follow up in 70months/labs- will order CT scan at that time.   # I reviewed the blood work- with the patient in detail; also reviewed the imaging independently [as summarized above]; and with the patient in detail.

## 2016-12-30 LAB — ERYTHROPOIETIN: Erythropoietin: 11.3 m[IU]/mL (ref 2.6–18.5)

## 2017-01-21 ENCOUNTER — Ambulatory Visit (INDEPENDENT_AMBULATORY_CARE_PROVIDER_SITE_OTHER): Payer: BLUE CROSS/BLUE SHIELD

## 2017-01-21 DIAGNOSIS — Z23 Encounter for immunization: Secondary | ICD-10-CM

## 2017-03-02 ENCOUNTER — Inpatient Hospital Stay: Payer: BLUE CROSS/BLUE SHIELD | Admitting: Nurse Practitioner

## 2017-03-02 ENCOUNTER — Inpatient Hospital Stay: Payer: BLUE CROSS/BLUE SHIELD

## 2017-03-04 ENCOUNTER — Inpatient Hospital Stay: Payer: BLUE CROSS/BLUE SHIELD | Attending: Internal Medicine

## 2017-03-04 ENCOUNTER — Other Ambulatory Visit: Payer: Self-pay

## 2017-03-04 ENCOUNTER — Inpatient Hospital Stay (HOSPITAL_BASED_OUTPATIENT_CLINIC_OR_DEPARTMENT_OTHER): Payer: BLUE CROSS/BLUE SHIELD | Admitting: Internal Medicine

## 2017-03-04 VITALS — BP 121/68 | HR 72 | Temp 97.2°F | Resp 20

## 2017-03-04 DIAGNOSIS — F17210 Nicotine dependence, cigarettes, uncomplicated: Secondary | ICD-10-CM | POA: Insufficient documentation

## 2017-03-04 DIAGNOSIS — G911 Obstructive hydrocephalus: Secondary | ICD-10-CM | POA: Diagnosis not present

## 2017-03-04 DIAGNOSIS — Z79899 Other long term (current) drug therapy: Secondary | ICD-10-CM

## 2017-03-04 DIAGNOSIS — I739 Peripheral vascular disease, unspecified: Secondary | ICD-10-CM | POA: Insufficient documentation

## 2017-03-04 DIAGNOSIS — C641 Malignant neoplasm of right kidney, except renal pelvis: Secondary | ICD-10-CM

## 2017-03-04 DIAGNOSIS — Z902 Acquired absence of lung [part of]: Secondary | ICD-10-CM | POA: Diagnosis not present

## 2017-03-04 DIAGNOSIS — I700 Atherosclerosis of aorta: Secondary | ICD-10-CM | POA: Diagnosis not present

## 2017-03-04 DIAGNOSIS — M349 Systemic sclerosis, unspecified: Secondary | ICD-10-CM

## 2017-03-04 DIAGNOSIS — Z85528 Personal history of other malignant neoplasm of kidney: Secondary | ICD-10-CM | POA: Diagnosis not present

## 2017-03-04 DIAGNOSIS — R413 Other amnesia: Secondary | ICD-10-CM

## 2017-03-04 DIAGNOSIS — C7802 Secondary malignant neoplasm of left lung: Secondary | ICD-10-CM | POA: Diagnosis not present

## 2017-03-04 DIAGNOSIS — I10 Essential (primary) hypertension: Secondary | ICD-10-CM | POA: Insufficient documentation

## 2017-03-04 DIAGNOSIS — I2510 Atherosclerotic heart disease of native coronary artery without angina pectoris: Secondary | ICD-10-CM | POA: Insufficient documentation

## 2017-03-04 DIAGNOSIS — M1990 Unspecified osteoarthritis, unspecified site: Secondary | ICD-10-CM

## 2017-03-04 DIAGNOSIS — J449 Chronic obstructive pulmonary disease, unspecified: Secondary | ICD-10-CM | POA: Insufficient documentation

## 2017-03-04 DIAGNOSIS — Z905 Acquired absence of kidney: Secondary | ICD-10-CM

## 2017-03-04 DIAGNOSIS — Z86010 Personal history of colonic polyps: Secondary | ICD-10-CM | POA: Diagnosis not present

## 2017-03-04 LAB — COMPREHENSIVE METABOLIC PANEL
ALT: 13 U/L — ABNORMAL LOW (ref 17–63)
AST: 26 U/L (ref 15–41)
Albumin: 3.8 g/dL (ref 3.5–5.0)
Alkaline Phosphatase: 63 U/L (ref 38–126)
Anion gap: 10 (ref 5–15)
BILIRUBIN TOTAL: 0.8 mg/dL (ref 0.3–1.2)
BUN: 13 mg/dL (ref 6–20)
CHLORIDE: 99 mmol/L — AB (ref 101–111)
CO2: 25 mmol/L (ref 22–32)
Calcium: 8.9 mg/dL (ref 8.9–10.3)
Creatinine, Ser: 0.99 mg/dL (ref 0.61–1.24)
GFR calc Af Amer: >60 mL/min (ref >60)
GFR calc non Af Amer: >60 mL/min (ref >60)
GLUCOSE: 101 mg/dL — AB (ref 65–99)
POTASSIUM: 4.1 mmol/L (ref 3.5–5.1)
Sodium: 134 mmol/L — ABNORMAL LOW (ref 135–145)
Total Protein: 6.9 g/dL (ref 6.5–8.1)

## 2017-03-04 LAB — CBC WITH DIFFERENTIAL/PLATELET
BASOS ABS: 0.1 10*3/uL (ref 0–0.1)
BASOS PCT: 1 %
EOS ABS: 0.2 10*3/uL (ref 0–0.7)
Eosinophils Relative: 3 %
HEMATOCRIT: 47.3 % (ref 40.0–52.0)
HEMOGLOBIN: 16 g/dL (ref 13.0–18.0)
LYMPHS PCT: 20 %
Lymphs Abs: 1.6 10*3/uL (ref 1.0–3.6)
MCH: 33.3 pg (ref 26.0–34.0)
MCHC: 33.8 g/dL (ref 32.0–36.0)
MCV: 98.5 fL (ref 80.0–100.0)
Monocytes Absolute: 0.7 10*3/uL (ref 0.2–1.0)
Monocytes Relative: 9 %
Neutro Abs: 5.4 10*3/uL (ref 1.4–6.5)
Neutrophils Relative %: 67 %
Platelets: 274 10*3/uL (ref 150–440)
RBC: 4.8 MIL/uL (ref 4.40–5.90)
RDW: 13.7 % (ref 11.5–14.5)
WBC: 8.1 10*3/uL (ref 3.8–10.6)

## 2017-03-04 NOTE — Assessment & Plan Note (Addendum)
#   METASTATIC RENAL CELL CA- GOOD RISK- s/p resection of LUL lung nodule; stage IV [ right lower lobe lung nodule]. CT scan in November 2018-NED; vascular lesions noted in the liver suspicious for hemangioma rather than malignancy.  # We will continue surveillance at this time; continue to hold Sutent because of intolerance.   # Memory lapses- ? Short term; recommend CT brain with contrast; previous CT brain may 2017- right frontal 92mm lesion vs artifact.   # COPD/smoking- Continues Advair. Unfortunately, continues to smoke.   # Scleroderma-/stable followed by rheumatology. No recent flareups.  #tobacco cessation- patient declines smoking cessation classes or medication. Denies wanting to stop smoking. Denies that smoking has or can negatively impact his health.  # Recent elevated hemoglobin at 18; likely secondary to smoking. Today hemoglobin is 16. Monitor for now.   # [id number];  # follow up in 4 weeks/no labs- will order scan at that vist for CT chest.  Addendum: Patient CT brain showed approximately 3-4 mm third ventricular lesions suggestive of metastases causing acute hydrocephalus.  Discussed with neurosurgery from [place] Dr. Izora Ribas who recommends urgent admission/neurosurgical evaluation.  Discussed with the patient wife-issues with insurance.  Recommend [facility] evaluation.  Patient asked to go to [facility] along with the disc.

## 2017-03-04 NOTE — Progress Notes (Signed)
[place] NOTE  Patient Care Team: Leone Haven, MD as PCP - General (Family Medicine)  CHIEF COMPLAINTS/PURPOSE OF CONSULTATION:   Oncology History   # JULY 2017 METASTATIC RCC- GOOD RISK [s/p LUL lung section;Dr.Oaks]; RLL- nodule ~23mm  # June 2017- ? Frontal lesion on CT [cannot have MRI]  # RIGHT KIDNEY CANCER [incidental 2012; diverticulitis] pT3a (4.3x4.3x 3.2cm) [facility]- clear cell G-2; Neg margins; May 2012 ]  # July 20th- PAZOPANIB 4 pills/day; Discontinued sec to Elevated LFTs.   # OCT 2017- Start Sunitinib 2w-On; 1 w-OFF; CT NOV 13th- RLL- Improved; no new disease. MARCH 7th CT- CR. SUTENT-HOLD [since July 2018]  # ? Scleroderma [Almodipine; Dr.Kernodle]- CONTRAINDICATION to IMMUNOTHERAPY.      Primary cancer of right kidney with metastasis from kidney to other site [facility]     HISTORY OF PRESENTING ILLNESS:  Thomas Le 62 y.o.  male with metastatic renal cell carcinoma to the lung status post left upper lobectomy (08/13/15) and right nephrectomy (2012). He has previously taken Sutent but this was stopped in July 2018 due to poor tolerance including fatigue.    Patient complains of memory lapses especially short-term in the last few weeks.  Denies any unusual headaches.  Denies any vision changes.  No significant gait instability.  No falls.  Continues to have chronic shortness of breath.  Complains of chronic bluish discoloration of his feet/hand.   ROS: A complete 10 point review of system is done which is negative except mentioned above in history of present illness  MEDICAL HISTORY:  Past Medical History:  Diagnosis Date  . Allergic rhinitis   . Anxiety   . Arthritis   . Asthma   . Chronic bronchitis ([place])   . Colitis    Had blood in his stool with this and treated in the hospital  . Colitis cystica profunda   . Colon polyps   . Complication of anesthesia    when waking up get claustophobic with mask, anxiety, panic  .  Depression   . Diverticulitis   . ED (erectile dysfunction)   . Genital herpes   . Hypertension   . Kidney disease   . Medical history non-contributory   . Occipital lymphadenopathy   . Peripheral vascular disease ([place])   . Renal cancer ([place])   . Shortness of breath dyspnea     SURGICAL HISTORY: Past Surgical History:  Procedure Laterality Date  . APPENDECTOMY  1966  . COLON SURGERY    . KIDNEY SURGERY Right 2012   Nephrectomy  . PARTIAL COLECTOMY  2012   For perforated colon related to diverticulitis- Dr. Marina Gravel  . VASECTOMY  2000  . VIDEO ASSISTED THORACOSCOPY (VATS)/THOROCOTOMY Left 08/13/2015   Procedure: LEFT THOROCOTOMY WITH LEFT UPPER LOBECTOMY, PREOP BRONCHOSCOPY;  Surgeon: Nestor Lewandowsky, MD;  Location: [facility] ORS;  Service: General;  Laterality: Left;    SOCIAL HISTORY: Social History   Socioeconomic History  . Marital status: Married    Spouse name: Not on file  . Number of children: Not on file  . Years of education: Not on file  . Highest education level: Not on file  Social Needs  . Financial resource strain: Not on file  . Food insecurity - worry: Not on file  . Food insecurity - inability: Not on file  . Transportation needs - medical: Not on file  . Transportation needs - non-medical: Not on file  Occupational History  . Not on file  Tobacco Use  .  Smoking status: Current Every Day Smoker    Packs/day: 0.25    Years: 30.00    Pack years: 7.50    Types: Cigarettes  . Smokeless tobacco: Never Used  Substance and Sexual Activity  . Alcohol use: Yes    Alcohol/week: 3.0 oz    Types: 5 Cans of beer per week    Comment: 5 beers a night   . Drug use: No  . Sexual activity: Yes  Other Topics Concern  . Not on file  Social History Narrative  . Not on file    FAMILY HISTORY: Family History  Problem Relation Age of Onset  . Arthritis/Rheumatoid Unknown        Parent  . Heart disease Unknown        Grandparent  . Alcoholism Unknown        Other  relative  . Arthritis Mother        Rhematoid    ALLERGIES:  has No Known Allergies.  MEDICATIONS:  Current Outpatient Medications  Medication Sig Dispense Refill  . ADVAIR DISKUS 500-50 MCG/DOSE AEPB TAKE 1 PUFF BY MOUTH TWICE A DAY 60 each 3  . amLODipine (NORVASC) 5 MG tablet Take 5 mg by mouth daily.  1  . ibuprofen (ADVIL,MOTRIN) 200 MG tablet Take 1 tablet by mouth every 6 (six) hours as needed.    Marland Kitchen PROAIR HFA 108 (90 Base) MCG/ACT inhaler Inhale 2 puffs every 6 (six) hours as needed into the lungs for wheezing.   11  . loperamide (IMODIUM A-D) 2 MG tablet Take 2 mg by mouth 4 (four) times daily as needed for diarrhea or loose stools.    . meloxicam (MOBIC) 7.5 MG tablet Take 1 tablet by mouth daily.    Marland Kitchen VIAGRA 50 MG tablet TAKE 2 TABLETS (100 MG TOTAL) BY MOUTH DAILY AS NEEDED FOR ERECTILE DYSFUNCTION.  0   No current facility-administered medications for this visit.     PHYSICAL EXAMINATION: ECOG PERFORMANCE STATUS: 0 - Asymptomatic  Vitals:   03/04/17 1415  BP: 121/68  Pulse: 72  Resp: 20  Temp: (!) 97.2 F (36.2 C)   There were no vitals filed for this visit.  GENERAL: Thin built moderately nourished adult male, alone. EYES: no pallor or icterus OROPHARYNX: no thrush or ulceration; good dentition  NECK: supple, no masses felt LYMPH: no palpable cervical lymph nodes LUNGS: clear to auscultation and  No wheeze or crackles HEART/CVS: regular rate & rhythm and no murmurs; No lower extremity edema ABDOMEN: abdomen soft, non-tender and normal bowel sounds Musculoskeletal:no cyanosis of digits and no clubbing; purplish discoloration of finger tips/ toes [chronic] PSYCH: alert & oriented x 3 with fluent speech. better mood today. NEURO: no focal motor/sensory deficits   LABORATORY DATA:  I have reviewed the data as listed Lab Results  Component Value Date   WBC 8.1 03/04/2017   HGB 16.0 03/04/2017   HCT 47.3 03/04/2017   MCV 98.5 03/04/2017   PLT 274  03/04/2017   Recent Labs    12/01/16 1130 12/29/16 1518 03/04/17 1356  NA 137 137 134*  K 4.6 4.1 4.1  CL 101 103 99*  CO2 28 26 25   GLUCOSE 92 108* 101*  BUN 5* 17 13  CREATININE 1.12 1.23 0.99  CALCIUM 9.3 8.9 8.9  GFRNONAA >60 >60 >60  GFRAA >60 >60 >60  PROT 7.4 7.1 6.9  ALBUMIN 4.0 3.8 3.8  AST 29 23 26   ALT 21 12* 13*  ALKPHOS 71 64  63  BILITOT 0.7 0.7 0.8  from a value  RADIOGRAPHIC STUDIES: I have personally reviewed the radiological images as listed and agreed with the findings in the report. Ct Head W Wo Contrast  Addendum Date: 03/06/2017   ADDENDUM REPORT: 03/06/2017 15:15 ADDENDUM: These results were called by telephone at the time of interpretation on 03/06/2017 at 3:15 pm to Dr. Charlaine Dalton , who verbally acknowledged these results. Electronically Signed   By: Franchot Gallo M.D.   On: 03/06/2017 15:15   Result Date: 03/06/2017 CLINICAL DATA:  Renal carcinoma.  Headache and memory loss EXAM: CT HEAD WITHOUT AND WITH CONTRAST TECHNIQUE: Contiguous axial images were obtained from the base of the skull through the vertex without and with intravenous contrast CONTRAST:  33mL ISOVUE-300 IOPAMIDOL (ISOVUE-300) INJECTION 61% COMPARISON:  CT head 07/20/2015 FINDINGS: Brain: Enhancing mass lesion within the third ventricle measuring 13 x 16 x 16 mm. This is mildly hyper dense prior to contrast material. This is causing moderately severe obstructive hydrocephalus with significant transependymal absorption of CSF due to acute hydrocephalus. The mass was not seen on the prior CT and the ventricles were normal on the prior CT. This is most likely due to metastatic disease. No other enhancing mass lesions in the brain. No midline shift. Negative for acute infarct Vascular: Developmental venous anomaly in the right frontal lobe is unchanged from the prior study. Negative for hyperdense vessel Skull: Negative Sinuses/Orbits: Mild mucosal edema paranasal sinuses. No orbital mass.  Other: None IMPRESSION: Enhancing mass within the third ventricle causing acute obstructive hydrocephalus. The mass is mildly hyper dense prior to contrast which could be due to hypercellular tumor versus hemorrhage. No other intracranial metastatic disease identified. Electronically Signed: By: Franchot Gallo M.D. On: 03/06/2017 15:11   IMPRESSION: 1. Right nephrectomy and left upper lobectomy. No evidence of metastatic disease. 2. Aortic atherosclerosis (ICD10-170.0). Three-vessel coronary artery calcification. 3. Bilateral adrenal adenomas.  Electronically Signed   By: Lorin Picket M.D.   On: 09/29/2016 13:54 -----------------------------------------------    IMPRESSION: No definite intracranial metastatic disease identified. Questionable 6 x 4 mm enhancing lesion versus artifact in the right frontal cortex, for which a tiny metastasis is not excluded. Attention on follow-up.   Electronically Signed   By: Logan Bores M.D.   On: 07/20/2015 13:54   ASSESSMENT & PLAN:   Primary cancer of right kidney with metastasis from kidney to other site [facility]) # METASTATIC RENAL CELL CA- GOOD RISK- s/p resection of LUL lung nodule; stage IV [ right lower lobe lung nodule]. CT scan in November 2018-NED; vascular lesions noted in the liver suspicious for hemangioma rather than malignancy.  # We will continue surveillance at this time; continue to hold Sutent because of intolerance.   # Memory lapses- ? Short term; recommend CT brain with contrast; previous CT brain may 2017- right frontal 83mm lesion vs artifact.   # COPD/smoking- Continues Advair. Unfortunately, continues to smoke.   # Scleroderma-/stable followed by rheumatology. No recent flareups.  #tobacco cessation- patient declines smoking cessation classes or medication. Denies wanting to stop smoking. Denies that smoking has or can negatively impact his health.  # Recent elevated hemoglobin at 18; likely secondary to smoking.  Today hemoglobin is 16. Monitor for now.   # [id number];  # follow up in 4 weeks/no labs- will order scan at that vist for CT chest.  Addendum: Patient CT brain showed approximately 3-4 mm third ventricular lesions suggestive of metastases causing acute hydrocephalus.  Discussed with  neurosurgery from [place] Dr. Izora Ribas who recommends urgent admission/neurosurgical evaluation.  Discussed with the patient wife-issues with insurance.  Recommend [facility] evaluation.  Patient asked to go to [facility] along with the disc.     Cammie Sickle, MD 03/09/2017 1:34 PM

## 2017-03-06 ENCOUNTER — Telehealth: Payer: Self-pay | Admitting: Internal Medicine

## 2017-03-06 ENCOUNTER — Ambulatory Visit
Admission: RE | Admit: 2017-03-06 | Discharge: 2017-03-06 | Disposition: A | Discharge status: Home / Self Care | Payer: BLUE CROSS/BLUE SHIELD | Source: Ambulatory Visit | Attending: Internal Medicine | Admitting: Internal Medicine

## 2017-03-06 DIAGNOSIS — R413 Other amnesia: Secondary | ICD-10-CM | POA: Diagnosis present

## 2017-03-06 DIAGNOSIS — G911 Obstructive hydrocephalus: Secondary | ICD-10-CM | POA: Diagnosis not present

## 2017-03-06 DIAGNOSIS — G9389 Other specified disorders of brain: Secondary | ICD-10-CM | POA: Diagnosis not present

## 2017-03-06 DIAGNOSIS — C641 Malignant neoplasm of right kidney, except renal pelvis: Secondary | ICD-10-CM | POA: Diagnosis present

## 2017-03-06 MED ORDER — IOPAMIDOL (ISOVUE-300) INJECTION 61%
75.0000 mL | Freq: Once | INTRAVENOUS | Status: AC | PRN
  Administered 2017-03-06: 75 mL via INTRAVENOUS

## 2017-03-06 NOTE — Telephone Encounter (Signed)
Spoke to  pt's wife .

## 2017-03-06 NOTE — Telephone Encounter (Signed)
Spoke to Dr. Cari Caraway; [facility] neurosurgery; kindly reviewed the imaging.  Given the acute hydrocephalus recommend neurosurgery evaluation at [facility] ER.  Given the insurance issues-referred to [facility] ER.  Discussed with the patient and wife; patient here to pick the CT scan disc/report.  Offered support.

## 2017-03-06 NOTE — Telephone Encounter (Signed)
Patient picked up imaging. Personally spoke with patient on the phone and face to face. Pt very tearful. Patient's daughter will drive him to [facility] ER asap.

## 2017-03-09 MED ORDER — GENERIC EXTERNAL MEDICATION
2.00 | Status: DC
Start: ? — End: 2017-03-09

## 2017-03-09 MED ORDER — OXYCODONE-ACETAMINOPHEN 5-325 MG PO TABS
1.00 | ORAL_TABLET | ORAL | Status: DC
Start: ? — End: 2017-03-09

## 2017-03-09 MED ORDER — IRON PO
1.00 | ORAL | Status: DC
Start: 2017-03-11 — End: 2017-03-09

## 2017-03-09 MED ORDER — THIAMINE HCL 100 MG PO TABS
100.00 mg | ORAL_TABLET | ORAL | Status: DC
Start: 2017-03-10 — End: 2017-03-09

## 2017-03-09 MED ORDER — NICOTINE 7 MG/24HR TD PT24
1.00 | MEDICATED_PATCH | TRANSDERMAL | Status: DC
Start: ? — End: 2017-03-09

## 2017-03-09 MED ORDER — ACETAMINOPHEN 325 MG PO TABS
650.00 mg | ORAL_TABLET | ORAL | Status: DC
Start: ? — End: 2017-03-09

## 2017-03-09 MED ORDER — DOCUSATE SODIUM 100 MG PO CAPS
100.00 mg | ORAL_CAPSULE | ORAL | Status: DC
Start: 2017-03-10 — End: 2017-03-09

## 2017-03-09 MED ORDER — GENERIC EXTERNAL MEDICATION
1.00 | Status: DC
Start: 2017-03-10 — End: 2017-03-09

## 2017-03-09 MED ORDER — FOLIC ACID 1 MG PO TABS
1.00 mg | ORAL_TABLET | ORAL | Status: DC
Start: 2017-03-10 — End: 2017-03-09

## 2017-03-09 MED ORDER — NICOTINE POLACRILEX 4 MG MT GUM
4.00 mg | CHEWING_GUM | OROMUCOSAL | Status: DC
Start: ? — End: 2017-03-09

## 2017-03-09 MED ORDER — ENOXAPARIN SODIUM 40 MG/0.4ML ~~LOC~~ SOLN
40.00 mg | SUBCUTANEOUS | Status: DC
Start: 2017-03-10 — End: 2017-03-09

## 2017-03-10 MED ORDER — SODIUM CHLORIDE 0.9 % IV SOLN
75.00 | INTRAVENOUS | Status: DC
Start: 2017-03-11 — End: 2017-03-10

## 2017-03-11 ENCOUNTER — Other Ambulatory Visit: Payer: BLUE CROSS/BLUE SHIELD

## 2017-03-11 ENCOUNTER — Ambulatory Visit: Payer: BLUE CROSS/BLUE SHIELD | Admitting: Internal Medicine

## 2017-03-20 LAB — JAK2 EXONS 12-15

## 2017-03-20 LAB — JAK2  V617F QUAL. WITH REFLEX TO EXON 12: Reflex:: 15

## 2017-03-31 ENCOUNTER — Telehealth: Payer: Self-pay | Admitting: Internal Medicine

## 2017-03-31 NOTE — Telephone Encounter (Signed)
Thomas Le- please check on pt if he has been discharged from [facility].  If discharged have pt see me this week/ labs- cbc-cmp; and also make a referral to Dr.Crystal asap; Dx; Brain met.   Spoke to surgeon at [facility]- mets in liver/surface of liver.

## 2017-03-31 NOTE — Telephone Encounter (Signed)
I contacted patient's wife to see if patient had been discharged from [facility]. Patient's wife states that he is still at [facility], and has heard that he should be getting discharged sometime before Friday 04/03/17. I advised her that when patient is discharged, Dr. B would like to see patient to evaluate him and get some labwork - patient's wife then states 'I don't know when we will be able to bring him in. He has been through a lot lately, and he is probably just going to a rehab facility after this and I don't know how we can handle all this right now." She then proceeded to sound very tearful & told me that "we are having some bad issues with our granddaughter, and the doctor here just told me this morning that his disease has now spread to his liver. After his surgery, he has no movement in his left arm and left leg and there is a lot going on here, and I don't know how soon I can get him in to your office."  I told patient that we are just trying to monitor him, and making sure he is given the care he needs in a timely manner. I advised patient to please contact us when he is discharged, and what the plan is for a facility, and we will move forward with getting him in with our office at that point.

## 2017-04-01 ENCOUNTER — Inpatient Hospital Stay: Payer: BLUE CROSS/BLUE SHIELD | Admitting: Internal Medicine

## 2017-04-06 MED ORDER — ACETAMINOPHEN 325 MG PO TABS
650.00 mg | ORAL_TABLET | ORAL | Status: DC
Start: ? — End: 2017-04-06

## 2017-04-06 MED ORDER — BUDESONIDE 0.25 MG/2ML IN SUSP
0.25 mg | RESPIRATORY_TRACT | Status: DC
Start: 2017-04-22 — End: 2017-04-06

## 2017-04-06 MED ORDER — AMIODARONE HCL 200 MG PO TABS
400.00 mg | ORAL_TABLET | ORAL | Status: DC
Start: 2017-04-06 — End: 2017-04-06

## 2017-04-06 MED ORDER — SODIUM CHLORIDE 3 % IN NEBU
4.00 | INHALATION_SOLUTION | RESPIRATORY_TRACT | Status: DC
Start: 2017-04-06 — End: 2017-04-06

## 2017-04-06 MED ORDER — ENOXAPARIN SODIUM 40 MG/0.4ML ~~LOC~~ SOLN
40.00 mg | SUBCUTANEOUS | Status: DC
Start: 2017-04-07 — End: 2017-04-06

## 2017-04-06 MED ORDER — GENERIC EXTERNAL MEDICATION
25.00 mg | Status: DC
Start: ? — End: 2017-04-06

## 2017-04-06 MED ORDER — GENERIC EXTERNAL MEDICATION
Status: DC
Start: ? — End: 2017-04-06

## 2017-04-06 MED ORDER — ALBUTEROL SULFATE (2.5 MG/3ML) 0.083% IN NEBU
2.50 mg | INHALATION_SOLUTION | RESPIRATORY_TRACT | Status: DC
Start: ? — End: 2017-04-06

## 2017-04-06 MED ORDER — IRON PO
1.00 | ORAL | Status: DC
Start: 2017-04-23 — End: 2017-04-06

## 2017-04-06 MED ORDER — AMLODIPINE BESYLATE 5 MG PO TABS
5.00 mg | ORAL_TABLET | ORAL | Status: DC
Start: 2017-04-23 — End: 2017-04-06

## 2017-04-06 MED ORDER — SODIUM CHLORIDE 0.9 % IV SOLN
75.00 | INTRAVENOUS | Status: DC
Start: ? — End: 2017-04-06

## 2017-04-06 MED ORDER — DOCUSATE SODIUM 150 MG/15ML PO LIQD
100.00 mg | ORAL | Status: DC
Start: 2017-04-22 — End: 2017-04-06

## 2017-04-22 MED ORDER — TRAZODONE HCL 50 MG PO TABS
25.00 mg | ORAL_TABLET | ORAL | Status: DC
Start: ? — End: 2017-04-22

## 2017-04-22 MED ORDER — AMIODARONE HCL 200 MG PO TABS
200.00 mg | ORAL_TABLET | ORAL | Status: DC
Start: 2017-04-23 — End: 2017-04-22

## 2017-04-22 MED ORDER — ONDANSETRON 4 MG PO TBDP
4.00 mg | ORAL_TABLET | ORAL | Status: DC
Start: ? — End: 2017-04-22

## 2017-04-22 MED ORDER — GENERIC EXTERNAL MEDICATION
Status: DC
Start: ? — End: 2017-04-22

## 2017-04-22 MED ORDER — PROMETHAZINE HCL 25 MG/ML IJ SOLN
12.50 mg | INTRAMUSCULAR | Status: DC
Start: ? — End: 2017-04-22

## 2017-04-22 MED ORDER — MIRTAZAPINE 15 MG PO TABS
15.00 mg | ORAL_TABLET | ORAL | Status: DC
Start: 2017-04-22 — End: 2017-04-22

## 2017-04-22 MED ORDER — OXYCODONE HCL 5 MG PO TABS
5.00 mg | ORAL_TABLET | ORAL | Status: DC
Start: ? — End: 2017-04-22

## 2017-04-22 MED ORDER — MELATONIN 3 MG PO TABS
6.00 mg | ORAL_TABLET | ORAL | Status: DC
Start: 2017-04-22 — End: 2017-04-22

## 2017-04-23 ENCOUNTER — Ambulatory Visit: Payer: BLUE CROSS/BLUE SHIELD | Admitting: Family Medicine

## 2017-05-06 ENCOUNTER — Emergency Department
Admission: EM | Admit: 2017-05-06 | Discharge: 2017-05-06 | Disposition: A | Discharge status: Other Acute Inpatient Hospital | Payer: BLUE CROSS/BLUE SHIELD | Attending: Emergency Medicine | Admitting: Emergency Medicine

## 2017-05-06 ENCOUNTER — Other Ambulatory Visit: Payer: Self-pay

## 2017-05-06 ENCOUNTER — Emergency Department: Payer: BLUE CROSS/BLUE SHIELD

## 2017-05-06 ENCOUNTER — Encounter: Payer: Self-pay | Admitting: Emergency Medicine

## 2017-05-06 DIAGNOSIS — W19XXXA Unspecified fall, initial encounter: Secondary | ICD-10-CM | POA: Diagnosis not present

## 2017-05-06 DIAGNOSIS — F17210 Nicotine dependence, cigarettes, uncomplicated: Secondary | ICD-10-CM | POA: Insufficient documentation

## 2017-05-06 DIAGNOSIS — J45909 Unspecified asthma, uncomplicated: Secondary | ICD-10-CM | POA: Insufficient documentation

## 2017-05-06 DIAGNOSIS — Z923 Personal history of irradiation: Secondary | ICD-10-CM | POA: Diagnosis not present

## 2017-05-06 DIAGNOSIS — Z85528 Personal history of other malignant neoplasm of kidney: Secondary | ICD-10-CM | POA: Insufficient documentation

## 2017-05-06 DIAGNOSIS — I619 Nontraumatic intracerebral hemorrhage, unspecified: Secondary | ICD-10-CM | POA: Insufficient documentation

## 2017-05-06 DIAGNOSIS — I10 Essential (primary) hypertension: Secondary | ICD-10-CM | POA: Diagnosis not present

## 2017-05-06 DIAGNOSIS — Z79899 Other long term (current) drug therapy: Secondary | ICD-10-CM | POA: Insufficient documentation

## 2017-05-06 DIAGNOSIS — R4182 Altered mental status, unspecified: Secondary | ICD-10-CM | POA: Diagnosis present

## 2017-05-06 LAB — CBC WITH DIFFERENTIAL/PLATELET
BASOS ABS: 0.1 10*3/uL (ref 0–0.1)
Basophils Relative: 1 %
EOS PCT: 2 %
Eosinophils Absolute: 0.1 10*3/uL (ref 0–0.7)
HCT: 31.7 % — ABNORMAL LOW (ref 40.0–52.0)
Hemoglobin: 10.5 g/dL — ABNORMAL LOW (ref 13.0–18.0)
Lymphocytes Relative: 11 %
Lymphs Abs: 1 10*3/uL (ref 1.0–3.6)
MCH: 30.8 pg (ref 26.0–34.0)
MCHC: 33.1 g/dL (ref 32.0–36.0)
MCV: 93.1 fL (ref 80.0–100.0)
MONO ABS: 0.6 10*3/uL (ref 0.2–1.0)
MONOS PCT: 7 %
Neutro Abs: 7.8 10*3/uL — ABNORMAL HIGH (ref 1.4–6.5)
Neutrophils Relative %: 79 %
PLATELETS: 451 10*3/uL — AB (ref 150–440)
RBC: 3.4 MIL/uL — ABNORMAL LOW (ref 4.40–5.90)
RDW: 14.6 % — AB (ref 11.5–14.5)
WBC: 9.6 10*3/uL (ref 3.8–10.6)

## 2017-05-06 LAB — URINALYSIS, COMPLETE (UACMP) WITH MICROSCOPIC
Bacteria, UA: NONE SEEN
Bilirubin Urine: NEGATIVE
GLUCOSE, UA: NEGATIVE mg/dL
HGB URINE DIPSTICK: NEGATIVE
Ketones, ur: 5 mg/dL — AB
LEUKOCYTES UA: NEGATIVE
NITRITE: NEGATIVE
PH: 6 (ref 5.0–8.0)
Protein, ur: NEGATIVE mg/dL
Specific Gravity, Urine: 1.02 (ref 1.005–1.030)

## 2017-05-06 LAB — COMPREHENSIVE METABOLIC PANEL
ALT: 13 U/L — ABNORMAL LOW (ref 17–63)
ANION GAP: 9 (ref 5–15)
AST: 21 U/L (ref 15–41)
Albumin: 3.5 g/dL (ref 3.5–5.0)
Alkaline Phosphatase: 55 U/L (ref 38–126)
BILIRUBIN TOTAL: 0.5 mg/dL (ref 0.3–1.2)
BUN: 18 mg/dL (ref 6–20)
CO2: 27 mmol/L (ref 22–32)
Calcium: 9 mg/dL (ref 8.9–10.3)
Chloride: 104 mmol/L (ref 101–111)
Creatinine, Ser: 0.83 mg/dL (ref 0.61–1.24)
GFR calc non Af Amer: >60 mL/min (ref >60)
Glucose, Bld: 106 mg/dL — ABNORMAL HIGH (ref 65–99)
POTASSIUM: 3.9 mmol/L (ref 3.5–5.1)
Sodium: 140 mmol/L (ref 135–145)
TOTAL PROTEIN: 7.8 g/dL (ref 6.5–8.1)

## 2017-05-06 LAB — PROTIME-INR
INR: 0.95
Prothrombin Time: 12.6 seconds (ref 11.4–15.2)

## 2017-05-06 LAB — APTT: APTT: 35 s (ref 24–36)

## 2017-05-06 LAB — INFLUENZA PANEL BY PCR (TYPE A & B)
INFLAPCR: NEGATIVE
Influenza B By PCR: NEGATIVE

## 2017-05-06 NOTE — ED Triage Notes (Signed)
Pt presents to ED via [facility] from [facility] c/o fall last night. SNF staff reported pt fell during night shift but was not sent out for unknown reason, unsure of circumstances of fall per day shift SNF staff. SNF state pt is typically confused at baseline but is more alert and able to speak, today pt has decreased LOC, opens eyes to noxious stimuli but does not answer questions. Hx brain CA.

## 2017-05-06 NOTE — ED Notes (Signed)
Pt's wife Syon Tews notified by Silvestre Moment of pt disposition and plan of care.

## 2017-05-06 NOTE — ED Notes (Signed)
Pt transferred to [facility] via [facility] helicopter transport. Pt unable to sign transfer consent at this time d/t altered level of consciousness r/t head bleed.

## 2017-05-06 NOTE — ED Notes (Signed)
EMTALA Reviewed

## 2017-05-06 NOTE — ED Provider Notes (Signed)
[facility] Emergency Department Provider Note  ____________________________________________  Time seen: Approximately 8:31 AM  I have reviewed the triage vital signs and the nursing notes.   HISTORY  Chief Complaint Altered Mental Status and Fall  Level 5 caveat:  Portions of the history and physical were unable to be obtained due to AMS   HPI Thomas Le is a 61 y.o. male with a history of metastatic kidney cancer to the brain s/p resection in 02/2017 currently on radiation and chemo therapy who presents from [facility] for [place]. according to the nursing home patient had a fall last night. This morning he was found to be less responsive per nursing staff which prompted EMS to be called. Per nursing home patient is able to do his daily activities, ambulate, and talk and his own. He is currently full code. Patient at this time would only answer yes to his name, will not answer any other questions, will follow simple commandssuch as squeeze my hands.   Past Medical History:  Diagnosis Date  . Allergic rhinitis   . Anxiety   . Arthritis   . Asthma   . Chronic bronchitis ([place])   . Colitis    Had blood in his stool with this and treated in the hospital  . Colitis cystica profunda   . Colon polyps   . Complication of anesthesia    when waking up get claustophobic with mask, anxiety, panic  . Depression   . Diverticulitis   . ED (erectile dysfunction)   . Genital herpes   . Hypertension   . Kidney disease   . Medical history non-contributory   . Occipital lymphadenopathy   . Peripheral vascular disease ([place])   . Renal cancer ([place])   . Shortness of breath dyspnea     Patient Active Problem List   Diagnosis Date Noted  . Allergic rhinitis 11/15/2015  . Primary cancer of right kidney with metastasis from kidney to other site [facility]) 08/30/2015  . Lung mass 08/13/2015  . Erectile dysfunction 06/14/2015  . Occipital lymphadenopathy 06/14/2015   . Anxiety and depression 05/14/2015  . Bilateral hand pain 05/14/2015  . Scleroderma ([place]) 05/14/2015  . Diarrhea 05/14/2015    Past Surgical History:  Procedure Laterality Date  . APPENDECTOMY  1966  . COLON SURGERY    . KIDNEY SURGERY Right 2012   Nephrectomy  . PARTIAL COLECTOMY  2012   For perforated colon related to diverticulitis- Dr. Marina Gravel  . VASECTOMY  2000  . VIDEO ASSISTED THORACOSCOPY (VATS)/THOROCOTOMY Left 08/13/2015   Procedure: LEFT THOROCOTOMY WITH LEFT UPPER LOBECTOMY, PREOP BRONCHOSCOPY;  Surgeon: Nestor Lewandowsky, MD;  Location: [facility] ORS;  Service: General;  Laterality: Left;    Prior to Admission medications   Medication Sig Start Date End Date Taking? Authorizing Provider  acetaminophen (TYLENOL) 650 MG CR tablet Take 650 mg by mouth every 4 (four) hours as needed for pain.   Yes [provider]  albuterol (PROVENTIL) (2.5 MG/3ML) 0.083% nebulizer solution Take 2.5 mg by nebulization every 6 (six) hours as needed for wheezing or shortness of breath.   Yes [provider]  amiodarone (PACERONE) 200 MG tablet Take 200 mg by mouth daily.   Yes [provider]  amLODipine (NORVASC) 5 MG tablet Take 5 mg by mouth daily. 02/10/17  Yes [provider]  bisacodyl (DULCOLAX) 10 MG suppository Place 10 mg rectally daily as needed for moderate constipation.   Yes [provider]  budesonide (PULMICORT)  0.25 MG/2ML nebulizer solution Take 0.25 mg by nebulization 2 (two) times daily.   Yes [provider]  docusate sodium (COLACE) 100 MG capsule Take 100 mg by mouth 2 (two) times daily.   Yes [provider]  Melatonin 3 MG TABS Take 6 mg by mouth daily.   Yes [provider]  meloxicam (MOBIC) 7.5 MG tablet Take 1 tablet by mouth daily.   Yes [provider]  mirtazapine (REMERON) 15 MG tablet Take 15 mg by mouth at bedtime.   Yes [provider]  Multiple Vitamin (MULTIVITAMIN WITH MINERALS)  TABS tablet Take 1 tablet by mouth daily.   Yes [provider]  polyethylene glycol (MIRALAX / GLYCOLAX) packet Take 17 g by mouth daily as needed.   Yes [provider]  sennosides (SENOKOT) 8.8 MG/5ML syrup Take 5 mLs by mouth daily.   Yes [provider]  traZODone (DESYREL) 50 MG tablet Take 25 mg by mouth every 12 (twelve) hours as needed.   Yes [provider]    Allergies Patient has no known allergies.  Family History  Problem Relation Age of Onset  . Arthritis/Rheumatoid Unknown        Parent  . Heart disease Unknown        Grandparent  . Alcoholism Unknown        Other relative  . Arthritis Mother        Rhematoid    Social History Social History   Tobacco Use  . Smoking status: Current Every Day Smoker    Packs/day: 0.25    Years: 30.00    Pack years: 7.50    Types: Cigarettes  . Smokeless tobacco: Never Used  Substance Use Topics  . Alcohol use: Yes    Alcohol/week: 3.0 oz    Types: 5 Cans of beer per week    Comment: 5 beers a night   . Drug use: No    Review of Systems  Constitutional: + fall and AMS  Level 5 caveat:  Portions of the history and physical were unable to be obtained due to AMS   ____________________________________________   PHYSICAL EXAM:  VITAL SIGNS: ED Triage Vitals  Enc Vitals Group     BP 05/06/17 0822 138/76     Pulse Rate 05/06/17 0822 67     Resp 05/06/17 0822 20     Temp 05/06/17 0822 97.6 F (36.4 C)     Temp Source 05/06/17 0822 Axillary     SpO2 --      Weight 05/06/17 0824 150 lb (68 kg)     Height 05/06/17 0824 5\' 9"  (1.753 m)     Head Circumference --      Peak Flow --      Pain Score --      Pain Loc --      Pain Edu? --      Excl. in [place]? --     Constitutional: Sleeping, arouses to sternal rub, answers yes when his name is called, will squeeze my hand with both his hands. HEENT:      Head: Normocephalic and atraumatic.         Eyes: Conjunctivae are normal.  Sclera is non-icteric. PERRL      Mouth/Throat: Mucous membranes are dry      Neck: Supple with no signs of meningismus. Cardiovascular: Regular rate and rhythm. No murmurs, gallops, or rubs. 2+ symmetrical distal pulses are present in all extremities. No JVD. Respiratory: Normal respiratory effort.  Lungs are clear to auscultation bilaterally. No wheezes, crackles, or rhonchi.  Gastrointestinal: Soft, non tender, and non distended with positive bowel sounds. No rebound or guarding. G tube in place Musculoskeletal: Atraumatic, no rash Neurologic: Face is symmetric. Moving all extremities. GCS 13 Skin: Skin is warm, dry and intact. No rash noted.  ____________________________________________   LABS (all labs ordered are listed, but only abnormal results are displayed)  Labs Reviewed  CBC WITH DIFFERENTIAL/PLATELET - Abnormal; Notable for the following components:      Result Value   RBC 3.40 (*)    Hemoglobin 10.5 (*)    HCT 31.7 (*)    RDW 14.6 (*)    Platelets 451 (*)    Neutro Abs 7.8 (*)    All other components within normal limits  COMPREHENSIVE METABOLIC PANEL - Abnormal; Notable for the following components:   Glucose, Bld 106 (*)    ALT 13 (*)    All other components within normal limits  URINALYSIS, COMPLETE (UACMP) WITH MICROSCOPIC - Abnormal; Notable for the following components:   Color, Urine YELLOW (*)    APPearance CLEAR (*)    Ketones, ur 5 (*)    Squamous Epithelial / LPF 0-5 (*)    All other components within normal limits  INFLUENZA PANEL BY PCR (TYPE A & B)  PROTIME-INR  APTT   ____________________________________________  EKG  ED ECG REPORT I, Rudene Re, the attending physician, personally viewed and interpreted this ECG.  Normal sinus rhythm, rate of 64, incomplete right bundle branch block, LVH, normal QTC, right axis deviation, no ST elevation or depression. no significant changes from  prior ____________________________________________  RADIOLOGY  I have personally reviewed the images performed during this visit and I agree with the Radiologist's read.   Interpretation by Radiologist:  Ct Head Wo Contrast  Result Date: 05/06/2017 CLINICAL DATA:  Pain following fall. EXAM: CT HEAD WITHOUT CONTRAST CT CERVICAL SPINE WITHOUT CONTRAST TECHNIQUE: Multidetector CT imaging of the head and cervical spine was performed following the standard protocol without intravenous contrast. Multiplanar CT image reconstructions of the cervical spine were also generated. COMPARISON:  Head CT March 06, 2017 FINDINGS: CT HEAD FINDINGS Brain: There is marked ventricular enlargement with a shunt catheter present. The tip of the shunt catheter is in the midline frontal horn region. Sulci appear normal. Previous mass at the level of the third ventricle is no longer appreciable. There is currently no mass. There is hemorrhage tracking from the right lateral ventricle anteriorly into the posterior aspect of the right frontal lobe which may be due to recent postoperative change or possibly may represent posttraumatic hemorrhage. No other acute hemorrhage is evident. There is currently no mass or midline shift. No extra-axial fluid collection. There is encephalomalacia in both frontal lobes, considerably more on the right than on the left as well as encephalomalacia in the right parieto-occipital region. No acute infarct is felt to be present. Vascular: No evident hyperdense vessel. There is calcification in each carotid siphon region. Skull: Postoperative changes noted in the right frontal bone. Trudee Kuster hole is noted in the left frontal bone. Shunt placement defect noted in the right parietal bone. Sinuses/Orbits: There is mucosal thickening in several ethmoid air cells bilaterally. Other visualized paranasal sinuses are clear. There are old fractures of the left and right nasal bones. Orbits appear symmetric  bilaterally. Other: Mastoid air cells are clear. CT CERVICAL SPINE FINDINGS Alignment: There is no appreciable spondylolisthesis. Skull base and vertebrae: Skull base and craniocervical junction  regions appear normal. No fracture. No blastic or lytic bone lesions. Soft tissues and spinal canal: Prevertebral soft tissues and predental space regions are normal. No paraspinous lesions. No cord or canal hematoma evident. Disc levels: There is mild disc space narrowing at C5-6 and C6-7. There is a prominent anterior osteophyte along the inferior aspect of C6. There is facet hypertrophy at several levels bilaterally. No disc extrusion or stenosis. Upper chest: Visualized upper lung zones are clear. Other: There is calcification in each carotid artery. IMPRESSION: CT head: 1. Apparent removal of third ventricle level tumor since prior study. Obstructive hydrocephalus remains with shunt catheter in place. Sulci appear normal. 2. Focal hemorrhage in the right frontal lobe. Question postoperative hemorrhage versus posttraumatic hemorrhage. No other foci of hemorrhage evident. No midline shift or extra-axial fluid collection. 3. Areas of encephalomalacia in both frontal lobes, more severe on the right than on the left as well as in the right parietooccipital junction. There is periventricular small vessel disease, stable. 4.  There are foci of arterial vascular calcification. 5.  Areas of paranasal sinus disease in the ethmoid air cells. CT cervical spine: No fracture or spondylolisthesis. Osteoarthritic change at several levels. Bilateral carotid artery calcification noted. Critical Value/emergent results were called by telephone at the time of interpretation on 05/06/2017 at 9:27 am to Dr. Rudene Re , who verbally acknowledged these results. Electronically Signed   By: Lowella Grip III M.D.   On: 05/06/2017 09:27   Ct Cervical Spine Wo Contrast  Result Date: 05/06/2017 CLINICAL DATA:  Pain following fall.  EXAM: CT HEAD WITHOUT CONTRAST CT CERVICAL SPINE WITHOUT CONTRAST TECHNIQUE: Multidetector CT imaging of the head and cervical spine was performed following the standard protocol without intravenous contrast. Multiplanar CT image reconstructions of the cervical spine were also generated. COMPARISON:  Head CT March 06, 2017 FINDINGS: CT HEAD FINDINGS Brain: There is marked ventricular enlargement with a shunt catheter present. The tip of the shunt catheter is in the midline frontal horn region. Sulci appear normal. Previous mass at the level of the third ventricle is no longer appreciable. There is currently no mass. There is hemorrhage tracking from the right lateral ventricle anteriorly into the posterior aspect of the right frontal lobe which may be due to recent postoperative change or possibly may represent posttraumatic hemorrhage. No other acute hemorrhage is evident. There is currently no mass or midline shift. No extra-axial fluid collection. There is encephalomalacia in both frontal lobes, considerably more on the right than on the left as well as encephalomalacia in the right parieto-occipital region. No acute infarct is felt to be present. Vascular: No evident hyperdense vessel. There is calcification in each carotid siphon region. Skull: Postoperative changes noted in the right frontal bone. Trudee Kuster hole is noted in the left frontal bone. Shunt placement defect noted in the right parietal bone. Sinuses/Orbits: There is mucosal thickening in several ethmoid air cells bilaterally. Other visualized paranasal sinuses are clear. There are old fractures of the left and right nasal bones. Orbits appear symmetric bilaterally. Other: Mastoid air cells are clear. CT CERVICAL SPINE FINDINGS Alignment: There is no appreciable spondylolisthesis. Skull base and vertebrae: Skull base and craniocervical junction regions appear normal. No fracture. No blastic or lytic bone lesions. Soft tissues and spinal canal:  Prevertebral soft tissues and predental space regions are normal. No paraspinous lesions. No cord or canal hematoma evident. Disc levels: There is mild disc space narrowing at C5-6 and C6-7. There is a prominent anterior osteophyte along  the inferior aspect of C6. There is facet hypertrophy at several levels bilaterally. No disc extrusion or stenosis. Upper chest: Visualized upper lung zones are clear. Other: There is calcification in each carotid artery. IMPRESSION: CT head: 1. Apparent removal of third ventricle level tumor since prior study. Obstructive hydrocephalus remains with shunt catheter in place. Sulci appear normal. 2. Focal hemorrhage in the right frontal lobe. Question postoperative hemorrhage versus posttraumatic hemorrhage. No other foci of hemorrhage evident. No midline shift or extra-axial fluid collection. 3. Areas of encephalomalacia in both frontal lobes, more severe on the right than on the left as well as in the right parietooccipital junction. There is periventricular small vessel disease, stable. 4.  There are foci of arterial vascular calcification. 5.  Areas of paranasal sinus disease in the ethmoid air cells. CT cervical spine: No fracture or spondylolisthesis. Osteoarthritic change at several levels. Bilateral carotid artery calcification noted. Critical Value/emergent results were called by telephone at the time of interpretation on 05/06/2017 at 9:27 am to Dr. Rudene Re , who verbally acknowledged these results. Electronically Signed   By: Lowella Grip III M.D.   On: 05/06/2017 09:27      ____________________________________________   PROCEDURES  Procedure(s) performed: None Procedures Critical Care performed: yes  CRITICAL CARE Performed by: Rudene Re  ?  Total critical care time: 40 min  Critical care time was exclusive of separately billable procedures and treating other patients.  Critical care was necessary to treat or prevent imminent or  life-threatening deterioration.  Critical care was time spent personally by me on the following activities: development of treatment plan with patient and/or surrogate as well as nursing, discussions with consultants, evaluation of patient's response to treatment, examination of patient, obtaining history from patient or surrogate, ordering and performing treatments and interventions, ordering and review of laboratory studies, ordering and review of radiographic studies, pulse oximetry and re-evaluation of patient's condition.  ____________________________________________   INITIAL IMPRESSION / ASSESSMENT AND PLAN / ED COURSE  62 y.o. male with a history of metastatic kidney cancer to the brain s/p resection in 02/2017 currently on radiation and chemo therapy who presents from [facility] for [place] and recent fall. No evidence of trauma on exam. CT head and cspine pending to eval for traumatic injury or complications associated with brain tumor/ recent resection. Will check labs to rule out dehydration, infection, electrolyte abnormalities. Patient has no meningeal signs and afebrile at this time with no clinical picture of meningitis.   Clinical Course as of May 06 1100  Wed May 06, 2017  0932 CT concerning for R frontal lobe acute hemorrhage and obstructive hydrocephalus with shunt in place. Patient continues to have GCS 13. BP well controlled. Patient not on blood thinners. Labs also consistent with stable anemia.  [CV]  1030 Patient accepted by Dr. Junious Silk, ED attending for ED to ED transfer  [CV]  1101 GCS 13, patient'e neuro exam remains unchanged. [employer] here for transport.  [CV]    Clinical Course User Index [CV] Alfred Levins [place], MD     As part of my medical decision making, I reviewed the following data within the [place] notes reviewed and incorporated, Labs reviewed , Radiograph reviewed , Discussed with admitting physician , A consult was  requested and obtained from this/these consultant(s) Neurosurgery, Notes from prior ED visits and [place] Controlled Substance Database    Pertinent labs & imaging results that were available during my care of the patient were reviewed by  me and considered in my medical decision making (see chart for details).    ____________________________________________   FINAL CLINICAL IMPRESSION(S) / ED DIAGNOSES  Final diagnoses:  Intraparenchymal hemorrhage of brain ([place])      NEW MEDICATIONS STARTED DURING THIS VISIT:  ED Discharge Orders    None       Note:  This document was prepared using Dragon voice recognition software and may include unintentional dictation errors.    Alfred Levins, [place], MD 05/06/17 [phone]

## 2017-05-06 NOTE — ED Notes (Signed)
Called [facility] transfer [facility] for transfer  [phone]

## 2017-05-11 ENCOUNTER — Telehealth: Payer: Self-pay | Admitting: *Deleted

## 2017-05-11 NOTE — Telephone Encounter (Signed)
Asking for refill of medications I advised she call PCP and [place] for refills needed as these were ordered by them

## 2017-05-18 ENCOUNTER — Telehealth: Payer: Self-pay | Admitting: *Deleted

## 2017-05-18 NOTE — Telephone Encounter (Signed)
Hassan Rowan, Dr. B has not seen this patient in months. He had cranial surgery. Either pcp or neurosurgery at [facility] needs to write for these orders.

## 2017-05-18 NOTE — Telephone Encounter (Signed)
Left message on voice mail to contact PCP or neurosurgeon for orders

## 2017-05-18 NOTE — Telephone Encounter (Signed)
Occupational therapy asking for orders for OT 2 times a week time 4. Please call order to [phone]

## 2017-05-20 ENCOUNTER — Telehealth: Payer: Self-pay | Admitting: Family Medicine

## 2017-05-20 NOTE — Telephone Encounter (Signed)
Verbal order given  

## 2017-05-20 NOTE — Telephone Encounter (Signed)
Please advise 

## 2017-05-20 NOTE — Telephone Encounter (Signed)
Sorry this one is for physical therapy. Please advise.

## 2017-05-20 NOTE — Telephone Encounter (Signed)
Copied from [place] [phone]. Topic: Quick Communication - See Telephone Encounter >> May 20, 2017  9:08 AM Robina Ade, Helene Kelp D wrote: CRM for notification. See Telephone encounter for: 05/20/17. Bobbe Medico with [facility] home health called to ask for verbal order for occupational therapy as follow: 2X a weeks for 4 weeks. She can be reached at [phone].

## 2017-05-20 NOTE — Telephone Encounter (Signed)
Copied from [place] [phone]. Topic: General - Other >> May 20, 2017  3:39 PM Cecelia Byars, NT wrote: Reason for CRM: [facility]  Aldrin a  physical therapist called and needing  verbal orders for homes health  2 x times a week for 5 weeks please call him at [phone], ok to leave detailed message

## 2017-05-20 NOTE — Telephone Encounter (Signed)
Please confirm what this is for and verbal orders can be given.

## 2017-05-20 NOTE — Telephone Encounter (Signed)
Verbal orders can be given. 

## 2017-05-20 NOTE — Telephone Encounter (Signed)
Occupational therapy to work on balance and ADL. Verbal order given

## 2017-05-27 ENCOUNTER — Telehealth: Payer: Self-pay | Admitting: Family Medicine

## 2017-05-27 NOTE — Telephone Encounter (Signed)
I am sorry to hear that.  Please see if we have a card so we can send our condolences.

## 2017-05-27 NOTE — Telephone Encounter (Signed)
fyi

## 2017-05-27 NOTE — Telephone Encounter (Signed)
Copied from [place] [phone]. Topic: Quick Communication - See Telephone Encounter >> May 27, 2017 12:15 PM Robina Ade, Helene Kelp D wrote: CRM for notification. See Telephone encounter for: 05/27/17. Sela Hua with [facility] called to let provider know that Thomas Le canceled todays' Thomas Le due to death in the family.

## 2017-05-28 NOTE — Telephone Encounter (Signed)
Please advise 

## 2017-05-29 ENCOUNTER — Telehealth: Payer: Self-pay | Admitting: Internal Medicine

## 2017-05-29 NOTE — Telephone Encounter (Signed)
Ask Juliann Pulse to see if she has a sympathy card.

## 2017-05-29 NOTE — Telephone Encounter (Signed)
Please advise 

## 2017-05-29 NOTE — Telephone Encounter (Signed)
Patient's wife had called neurosurgery [facility] that our office will be willing to take the staples out at [facility] . I spoke to nurse practitioner from neurosurgery; that I would recommend patient has his staples taken out at [facility] neurosurgery.

## 2017-06-01 NOTE — Telephone Encounter (Signed)
Given to Dr.Sonnenberg

## 2017-06-01 NOTE — Telephone Encounter (Signed)
Card completed.

## 2017-06-01 NOTE — Telephone Encounter (Signed)
mailed

## 2017-06-11 ENCOUNTER — Other Ambulatory Visit: Payer: Self-pay | Admitting: *Deleted

## 2017-06-11 DIAGNOSIS — C641 Malignant neoplasm of right kidney, except renal pelvis: Secondary | ICD-10-CM

## 2017-06-22 ENCOUNTER — Other Ambulatory Visit: Payer: Self-pay

## 2017-06-22 ENCOUNTER — Inpatient Hospital Stay: Payer: BLUE CROSS/BLUE SHIELD | Attending: Internal Medicine | Admitting: Internal Medicine

## 2017-06-22 ENCOUNTER — Inpatient Hospital Stay: Payer: BLUE CROSS/BLUE SHIELD

## 2017-06-22 VITALS — BP 115/67 | HR 58 | Temp 96.8°F | Resp 18 | Wt 137.8 lb

## 2017-06-22 DIAGNOSIS — M1990 Unspecified osteoarthritis, unspecified site: Secondary | ICD-10-CM

## 2017-06-22 DIAGNOSIS — Z931 Gastrostomy status: Secondary | ICD-10-CM | POA: Insufficient documentation

## 2017-06-22 DIAGNOSIS — C7931 Secondary malignant neoplasm of brain: Secondary | ICD-10-CM | POA: Diagnosis not present

## 2017-06-22 DIAGNOSIS — I739 Peripheral vascular disease, unspecified: Secondary | ICD-10-CM | POA: Diagnosis not present

## 2017-06-22 DIAGNOSIS — Z79899 Other long term (current) drug therapy: Secondary | ICD-10-CM | POA: Insufficient documentation

## 2017-06-22 DIAGNOSIS — C641 Malignant neoplasm of right kidney, except renal pelvis: Secondary | ICD-10-CM | POA: Insufficient documentation

## 2017-06-22 DIAGNOSIS — M349 Systemic sclerosis, unspecified: Secondary | ICD-10-CM | POA: Diagnosis not present

## 2017-06-22 DIAGNOSIS — C7802 Secondary malignant neoplasm of left lung: Secondary | ICD-10-CM | POA: Diagnosis not present

## 2017-06-22 DIAGNOSIS — C787 Secondary malignant neoplasm of liver and intrahepatic bile duct: Secondary | ICD-10-CM | POA: Insufficient documentation

## 2017-06-22 DIAGNOSIS — I10 Essential (primary) hypertension: Secondary | ICD-10-CM | POA: Diagnosis not present

## 2017-06-22 DIAGNOSIS — F17210 Nicotine dependence, cigarettes, uncomplicated: Secondary | ICD-10-CM | POA: Diagnosis not present

## 2017-06-22 DIAGNOSIS — R918 Other nonspecific abnormal finding of lung field: Secondary | ICD-10-CM

## 2017-06-22 DIAGNOSIS — J449 Chronic obstructive pulmonary disease, unspecified: Secondary | ICD-10-CM

## 2017-06-22 LAB — CBC WITH DIFFERENTIAL/PLATELET
BASOS ABS: 0.1 10*3/uL (ref 0–0.1)
Basophils Relative: 1 %
Eosinophils Absolute: 0.3 10*3/uL (ref 0–0.7)
Eosinophils Relative: 3 %
HCT: 38.1 % — ABNORMAL LOW (ref 40.0–52.0)
HEMOGLOBIN: 12.6 g/dL — AB (ref 13.0–18.0)
LYMPHS ABS: 2.4 10*3/uL (ref 1.0–3.6)
LYMPHS PCT: 31 %
MCH: 30.7 pg (ref 26.0–34.0)
MCHC: 32.9 g/dL (ref 32.0–36.0)
MCV: 93.1 fL (ref 80.0–100.0)
Monocytes Absolute: 0.6 10*3/uL (ref 0.2–1.0)
Monocytes Relative: 8 %
NEUTROS ABS: 4.3 10*3/uL (ref 1.4–6.5)
NEUTROS PCT: 57 %
Platelets: 311 10*3/uL (ref 150–440)
RBC: 4.09 MIL/uL — AB (ref 4.40–5.90)
RDW: 17.2 % — ABNORMAL HIGH (ref 11.5–14.5)
WBC: 7.7 10*3/uL (ref 3.8–10.6)

## 2017-06-22 LAB — COMPREHENSIVE METABOLIC PANEL
ALK PHOS: 70 U/L (ref 38–126)
ALT: 11 U/L — AB (ref 17–63)
AST: 18 U/L (ref 15–41)
Albumin: 4 g/dL (ref 3.5–5.0)
Anion gap: 10 (ref 5–15)
BUN: 29 mg/dL — ABNORMAL HIGH (ref 6–20)
CALCIUM: 9.2 mg/dL (ref 8.9–10.3)
CO2: 23 mmol/L (ref 22–32)
CREATININE: 0.87 mg/dL (ref 0.61–1.24)
Chloride: 103 mmol/L (ref 101–111)
Glucose, Bld: 67 mg/dL (ref 65–99)
Potassium: 4.8 mmol/L (ref 3.5–5.1)
Sodium: 136 mmol/L (ref 135–145)
Total Bilirubin: 0.3 mg/dL (ref 0.3–1.2)
Total Protein: 7.8 g/dL (ref 6.5–8.1)

## 2017-06-22 NOTE — Progress Notes (Signed)
Here for follow up. Pt stated feeling weak and tired.  Having " confusion " per pt.  Per wife pt taking more by mouth and has stopped using his feeding tube 2 weeks ago-stated they want to know if they can have it removed.

## 2017-06-22 NOTE — Assessment & Plan Note (Addendum)
#   METASTATIC RENAL CELL CA- s/p resection of LUL lung nodule; stage IV [ right lower lobe lung nodule]. Sutent July 2018- held sec to intol/CT NED.  Patient currently not on therapy-given extensive hospitalization [status post metastatic brain resection]/discharge from the hospital.  #Recommend restaging CT scan chest abdomen pelvis MRI-to evaluate the extent of the disease.  Patient will likely need starting back on treatment-with a tyrosine kinase inhibitor.  I would recommend cabo given previous intolerance to Sutent.  # PEG tube explantation-as patient has been eating well.  Will check with Almyra Free.  # COPD/smoking- Continues Advair. Unfortunately, continues to smoke.   # Scleroderma-/stable followed by rheumatology. No recent flareups.  #Recommend imaging-MRI abdomen pelvis and CT chest; appointment with Julie-dietitian regarding PEG tube explantation; follow-up with me a few days later.

## 2017-06-22 NOTE — Progress Notes (Signed)
[facility] NOTE  Patient Care Team: Leone Haven, MD as PCP - General (Family Medicine)  CHIEF COMPLAINTS/PURPOSE OF CONSULTATION:   Oncology History   # JULY 2017 METASTATIC RCC- GOOD RISK [s/p LUL lung section;Dr.Oaks]; RLL- nodule ~78m  # June 2017- ? Frontal lesion on CT [cannot have MRI]  # RIGHT KIDNEY CANCER [incidental 2012; diverticulitis] pT3a (4.3x4.3x 3.2cm) [[facility] clear cell G-2; Neg margins; May 2012 ]  # July 20th- PAZOPANIB 4 pills/day; Discontinued sec to Elevated LFTs.   # OCT 2017- Start Sunitinib 2w-On; 1 w-OFF; CT NOV 13th- RLL- Improved; no new disease. MARCH 7th CT- CR. SUTENT-HOLD [since July 2018-sec to fatigue/poor tol]  # march 2019- Brain met [s/p resection/ s/p VP shunt; Dr.Jaikumar]; Bx of liver lesion- RCC [[facility]]  # May 12th 2019- Cabo 40 mg/day  # ? Scleroderma [Almodipine; Dr.Kernodle]- CONTRAINDICATION to IMMUNOTHERAPY.      Primary cancer of right kidney with metastasis from kidney to other site ([facility]     HISTORY OF PRESENTING ILLNESS:  Thomas WIEGMAN89y.o.  male with metastatic renal cell carcinoma to the lung status post left upper lobectomy (08/13/15) and right nephrectomy (2012). He has previously taken Sutent but this was stopped in July 2018 due to poor tolerance including fatigue.    [facility] and [facility] placement.  He also had a liver biopsy-for enlarging liver lesion positive for renal cell carcinoma.  Patient had prolonged hospitalization; also needed a PEG tube for feeding/rehab placement.  Currently, patient is discharged home.-His appetite is improved.  He is eating by himself.  Gaining weight.   Unfortunately continues to smoke.  Continues to have loose discoloration of his fingertips and toes attributable to his scleroderma.  ROS: A complete 10 point review of system is done which is negative except  mentioned above in history of present illness  MEDICAL HISTORY:  Past Medical History:  Diagnosis Date  . Allergic rhinitis   . Anxiety   . Arthritis   . Asthma   . Chronic bronchitis ([place]   . Colitis    Had blood in his stool with this and treated in the hospital  . Colitis cystica profunda   . Colon polyps   . Complication of anesthesia    when waking up get claustophobic with mask, anxiety, panic  . Depression   . Diverticulitis   . ED (erectile dysfunction)   . Genital herpes   . Hypertension   . Kidney disease   . Medical history non-contributory   . Occipital lymphadenopathy   . [place]ipheral vascular disease ([place]   . Renal cancer ([place]   . Shortness of breath dyspnea     SURGICAL HISTORY: Past Surgical History:  Procedure Laterality Date  . APPENDECTOMY  1966  . COLON SURGERY    . KIDNEY SURGERY Right 2012   Nephrectomy  . PARTIAL COLECTOMY  2012   For perforated colon related to diverticulitis- Dr. BMarina Gravel . VASECTOMY  2000  . VIDEO ASSISTED THORACOSCOPY (VATS)/THOROCOTOMY Left 08/13/2015   Procedure: LEFT THOROCOTOMY WITH LEFT UPPER LOBECTOMY, PREOP BRONCHOSCOPY;  Surgeon: TNestor Lewandowsky MD;  Location: [facility] ORS;  Service: General;  Laterality: Left;    SOCIAL HISTORY: Social History   Socioeconomic History  . Marital status: Married    Spouse name: Not on file  . Number of children: Not on file  . Years of education: Not  on file  . Highest education level: Not on file  Occupational History  . Not on file  Social Needs  . Financial resource strain: Not on file  . Food insecurity:    Worry: Not on file    Inability: Not on file  . Transportation needs:    Medical: Not on file    Non-medical: Not on file  Tobacco Use  . Smoking status: Current Every Day Smoker    Packs/day: 0.25    Years: 30.00    Pack years: 7.50    Types: Cigarettes  . Smokeless tobacco: Never Used  Substance and Sexual Activity  . Alcohol use: Yes    Alcohol/week: 3.0 oz     Types: 5 Cans of beer per week    Comment: 5 beers a night   . Drug use: No  . Sexual activity: Yes  Lifestyle  . Physical activity:    Days per week: Not on file    Minutes per session: Not on file  . Stress: Not on file  Relationships  . Social connections:    Talks on phone: Not on file    Gets together: Not on file    Attends religious service: Not on file    Active member of club or organization: Not on file    Attends meetings of clubs or organizations: Not on file    Relationship status: Not on file  . Intimate partner violence:    Fear of current or ex partner: Not on file    Emotionally abused: Not on file    Physically abused: Not on file    Forced sexual activity: Not on file  Other Topics Concern  . Not on file  Social History Narrative  . Not on file    FAMILY HISTORY: Family History  Problem Relation Age of Onset  . Arthritis/Rheumatoid Unknown        Parent  . Heart disease Unknown        Grandparent  . Alcoholism Unknown        Other relative  . Arthritis Mother        Rhematoid    ALLERGIES:  has No Known Allergies.  MEDICATIONS:  Current Outpatient Medications  Medication Sig Dispense Refill  . meloxicam (MOBIC) 7.5 MG tablet Take 1 tablet by mouth daily.    Marland Kitchen acetaminophen (TYLENOL) 500 MG tablet Take 500 mg by mouth every 6 (six) hours as needed for mild pain.    Marland Kitchen amLODipine (NORVASC) 2.5 MG tablet Take 1 tablet (2.5 mg total) by mouth daily. 60 tablet 3  . cabozantinib (CABOMETYX) 40 MG tablet Take 1 tablet (40 mg total) by mouth daily. Take on an empty stomach, 1 hour before or 2 hours after meals. 30 tablet 3   No current facility-administered medications for this visit.     PHYSICAL EXAMINATION: ECOG PERFORMANCE STATUS: 0 - Asymptomatic  Vitals:   06/22/17 1555  BP: 115/67  Pulse: (!) 58  Resp: 18  Temp: (!) 96.8 F (36 C)   Filed Weights   06/22/17 1555  Weight: 137 lb 12.8 oz (62.5 kg)    GENERAL: Thin built moderately  nourished adult male, alone. EYES: no pallor or icterus OROPHARYNX: no thrush or ulceration; good dentition  NECK: supple, no masses felt LYMPH: no palpable cervical lymph nodes LUNGS: clear to auscultation and  No wheeze or crackles HEART/CVS: regular rate & rhythm and no murmurs; No lower extremity edema ABDOMEN: abdomen soft, non-tender and normal bowel  sounds Musculoskeletal:no cyanosis of digits and no clubbing; purplish discoloration of finger tips/ toes [chronic] PSYCH: alert & oriented x 3 with fluent speech. better mood today. NEURO: no focal motor/sensory deficits   LABORATORY DATA:  I have reviewed the data as listed Lab Results  Component Value Date   WBC 7.7 06/22/2017   HGB 12.6 (L) 06/22/2017   HCT 38.1 (L) 06/22/2017   MCV 93.1 06/22/2017   PLT 311 06/22/2017   Recent Labs    03/04/17 1356 05/06/17 0834 06/22/17 1506  NA 134* 140 136  K 4.1 3.9 4.8  CL 99* 104 103  CO2 '25 27 23  ' GLUCOSE 101* 106* 67  BUN 13 18 29*  CREATININE 0.99 0.83 0.87  CALCIUM 8.9 9.0 9.2  GFRNONAA >60 >60 >60  GFRAA >60 >60 >60  PROT 6.9 7.8 7.8  ALBUMIN 3.8 3.5 4.0  AST '26 21 18  ' ALT 13* 13* 11*  ALKPHOS 63 55 70  BILITOT 0.8 0.5 0.3  from a value  RADIOGRAPHIC STUDIES: I have personally reviewed the radiological images as listed and agreed with the findings in the report. Ct Chest Wo Contrast  Result Date: 06/30/2017 CLINICAL DATA:  Patient with history of renal cell carcinoma with pulmonary metastasis. EXAM: CT CHEST WITHOUT CONTRAST TECHNIQUE: Multidetector CT imaging of the chest was performed following the standard protocol without IV contrast. COMPARISON:  Chest CT 12/25/2016 FINDINGS: Cardiovascular: Normal heart size. Trace pericardial fluid. Coronary arterial vascular calcifications. Thoracic aortic vascular calcifications. Mediastinum/Nodes: No enlarged axillary, mediastinal or hilar lymphadenopathy. Normal esophagus. Lungs/Pleura: Central airways are patent. Patient  status post left upper lobectomy. Stable leftward mediastinal shift. There is a new 3 mm left lower lobe nodule (image 140; series 3). New 2 mm left lower lobe nodule (image 104; series 3). New 5 mm right lower lobe nodule (image 114; series 3). Right apical scarring. No pleural effusion or pneumothorax. Stable 3 mm peripheral right upper lobe nodule (image 84; series 3). Upper Abdomen: Within the central right hepatic lobe there is a 5.8 x 4.8 cm low-attenuation lesion, incompletely evaluated (image 194; series 2). Similar-appearing 7 mm low-attenuation lesion left hepatic lobe (image 151; series 2). Possible new 1 cm low-attenuation lesion hepatic dome (image 154; series 2). Incompletely visualized post right nephrectomy changes. Similar-appearing left adrenal nodularity. Musculoskeletal: Thoracic spine degenerative changes. 10 mm soft tissue mass in lytic lesion within the posterior left eleventh rib (image 160; series 3). Additionally there is patchy sclerosis within the peripheral left eleventh rib (image 180; series 3). Patchy lucency within the left tenth rib (image 156; series 3). IMPRESSION: 1. Interval development of bilateral pulmonary nodules concerning for metastatic disease 2. Interval development of incompletely visualized irregular low-attenuation masslike area within the liver concerning for metastatic disease. There are additional smaller low-attenuation lesions within the liver which are indeterminate. 3. New lytic lesions involving the osseous structures concerning for osseous metastatic disease. 4. Aortic Atherosclerosis (ICD10-I70.0). Electronically Signed   By: Lovey Newcomer M.D.   On: 06/30/2017 15:31   Mr Abdomen W Wo Contrast  Result Date: 06/30/2017 CLINICAL DATA:  62 year old male with history of renal cancer status post nephrectomy. New diagnosis of brain metastases. EXAM: MRI ABDOMEN WITHOUT AND WITH CONTRAST TECHNIQUE: Multiplanar multisequence MR imaging of the abdomen was performed  both before and after the administration of intravenous contrast. CONTRAST:  88m MULTIHANCE GADOBENATE DIMEGLUMINE 529 MG/ML IV SOLN COMPARISON:  No prior abdominal MRI. CT the abdomen and pelvis 04/30/2016. FINDINGS: Lower chest: Visualized portions  are unremarkable. Hepatobiliary: Liver has a slightly shrunken appearance and nodular contour, suggesting early changes of cirrhosis. In the central aspect of the liver, most evident in segments 4B and 5 there is a large area that is heterogeneous in signal intensity on T1 and T2 weighted images, but is predominantly T1 hypointense and slightly T2 hyperintense which demonstrates avid arterial phase hyperenhancement which is heterogeneous, with multiple internal areas of washout on delayed post gadolinium images, many of which demonstrate an apparent capsule or pseudocapsule. This area is irregular in shape and therefore difficult to discretely measure, but is estimated to be approximately 7.9 x 7.7 x 5.2 cm (axial image 55 of series 16 and coronal image 16 of series 4). No intra or extrahepatic biliary ductal dilatation. Gallbladder is normal in appearance. A few other scattered T1 hypointense, T2 hyperintense, nonenhancing liver lesions are also noted, subcentimeter in size, likely to represent tiny cysts and/or biliary hamartomas. Pancreas: No pancreatic mass. No pancreatic ductal dilatation. No pancreatic or peripancreatic fluid or inflammatory changes. Spleen:  Unremarkable. Adrenals/Urinary Tract: Status post right nephrectomy. Left kidney is normal in appearance. No hydroureteronephrosis in the visualized portions of the abdomen. Right adrenal gland is normal in appearance. 2.9 x 1.5 cm left adrenal nodule demonstrates diffuse loss of signal intensity on out of phase dual echo images, compatible with an adenoma. Stomach/Bowel: Visualized portions are unremarkable. Vascular/Lymphatic: Extensive atherosclerosis in the visualized abdominal vasculature, without  evidence of aneurysm. No lymphadenopathy noted in the abdomen. Other: No significant volume of ascites noted in the visualized portions of the peritoneal cavity. Musculoskeletal: No aggressive appearing osseous lesions are noted in the visualized portions of the skeleton. IMPRESSION: 1. Large infiltrative hypovascular neoplasm with washout and some imaging characteristics concerning for hepatocellular carcinoma. Subtle findings in the liver suggestive of underlying cirrhosis. Correlation with AFP levels is strongly recommended. 2. Left adrenal adenoma again noted. 3. Status post right nephrectomy. Electronically Signed   By: Vinnie Langton M.D.   On: 06/30/2017 15:42       1.3 cm hypodensity left hepatic lobe, likely cyst. -In the inferior right hepatic lobe, there is an ill-defined heterogeneous enhancing lesion with multiple internal hypodense foci. Recommend contrast-enhanced abdominal MRI for further characterization. -Nodularity of the left adrenal gland, recommend further characterization with abdominal MRI. - Sequelae of left upper lobectomy and right nephrectomy with no evidence of local disease recurrence. - Subcentimeter bilateral pulmonary nodules. Recommend follow-up CT in 6-12 months to ensure stability.  Result Narrative     ASSESSMENT & PLAN:   Primary cancer of right kidney with metastasis from kidney to other site [facility]) # METASTATIC RENAL CELL CA- s/p resection of LUL lung nodule; stage IV [ right lower lobe lung nodule]. Sutent July 2018- held sec to intol/CT NED.  Patient currently not on therapy-given extensive hospitalization [status post metastatic brain resection]/discharge from the hospital.  #Recommend restaging CT scan chest abdomen pelvis MRI-to evaluate the extent of the disease.  Patient will likely need starting back on treatment-with a tyrosine kinase inhibitor.  I would recommend cabo given previous intolerance to Sutent.  # PEG tube explantation-as patient  has been eating well.  Will check with Almyra Free.  # COPD/smoking- Continues Advair. Unfortunately, continues to smoke.   # Scleroderma-/stable followed by rheumatology. No recent flareups.  #Recommend imaging-MRI abdomen pelvis and CT chest; appointment with Julie-dietitian regarding PEG tube explantation; follow-up with me a few days later.        Cammie Sickle, MD 07/07/2017 4:45 PM

## 2017-06-30 ENCOUNTER — Ambulatory Visit
Admission: RE | Admit: 2017-06-30 | Discharge: 2017-06-30 | Disposition: A | Discharge status: Home / Self Care | Payer: BLUE CROSS/BLUE SHIELD | Source: Ambulatory Visit | Attending: Internal Medicine | Admitting: Internal Medicine

## 2017-06-30 DIAGNOSIS — D3502 Benign neoplasm of left adrenal gland: Secondary | ICD-10-CM | POA: Insufficient documentation

## 2017-06-30 DIAGNOSIS — C787 Secondary malignant neoplasm of liver and intrahepatic bile duct: Secondary | ICD-10-CM

## 2017-06-30 DIAGNOSIS — I700 Atherosclerosis of aorta: Secondary | ICD-10-CM | POA: Insufficient documentation

## 2017-06-30 DIAGNOSIS — M899 Disorder of bone, unspecified: Secondary | ICD-10-CM | POA: Diagnosis not present

## 2017-06-30 DIAGNOSIS — Z905 Acquired absence of kidney: Secondary | ICD-10-CM | POA: Insufficient documentation

## 2017-06-30 DIAGNOSIS — C641 Malignant neoplasm of right kidney, except renal pelvis: Secondary | ICD-10-CM

## 2017-06-30 DIAGNOSIS — R918 Other nonspecific abnormal finding of lung field: Secondary | ICD-10-CM

## 2017-06-30 MED ORDER — GADOBENATE DIMEGLUMINE 529 MG/ML IV SOLN
12.0000 mL | Freq: Once | INTRAVENOUS | Status: AC | PRN
  Administered 2017-06-30: 12 mL via INTRAVENOUS

## 2017-07-01 ENCOUNTER — Other Ambulatory Visit: Payer: Self-pay

## 2017-07-01 ENCOUNTER — Inpatient Hospital Stay: Payer: BLUE CROSS/BLUE SHIELD | Attending: Internal Medicine | Admitting: Internal Medicine

## 2017-07-01 ENCOUNTER — Telehealth: Payer: Self-pay | Admitting: Pharmacist

## 2017-07-01 ENCOUNTER — Encounter: Payer: Self-pay | Admitting: Internal Medicine

## 2017-07-01 VITALS — BP 98/60 | HR 70 | Temp 97.8°F | Resp 16 | Ht 69.0 in | Wt 139.0 lb

## 2017-07-01 DIAGNOSIS — R634 Abnormal weight loss: Secondary | ICD-10-CM | POA: Diagnosis not present

## 2017-07-01 DIAGNOSIS — R5381 Other malaise: Secondary | ICD-10-CM | POA: Diagnosis not present

## 2017-07-01 DIAGNOSIS — I739 Peripheral vascular disease, unspecified: Secondary | ICD-10-CM | POA: Diagnosis not present

## 2017-07-01 DIAGNOSIS — Z79899 Other long term (current) drug therapy: Secondary | ICD-10-CM

## 2017-07-01 DIAGNOSIS — F17210 Nicotine dependence, cigarettes, uncomplicated: Secondary | ICD-10-CM | POA: Insufficient documentation

## 2017-07-01 DIAGNOSIS — Z931 Gastrostomy status: Secondary | ICD-10-CM | POA: Diagnosis not present

## 2017-07-01 DIAGNOSIS — C7802 Secondary malignant neoplasm of left lung: Secondary | ICD-10-CM | POA: Diagnosis not present

## 2017-07-01 DIAGNOSIS — M1990 Unspecified osteoarthritis, unspecified site: Secondary | ICD-10-CM | POA: Diagnosis not present

## 2017-07-01 DIAGNOSIS — R0781 Pleurodynia: Secondary | ICD-10-CM | POA: Insufficient documentation

## 2017-07-01 DIAGNOSIS — J449 Chronic obstructive pulmonary disease, unspecified: Secondary | ICD-10-CM | POA: Diagnosis not present

## 2017-07-01 DIAGNOSIS — C787 Secondary malignant neoplasm of liver and intrahepatic bile duct: Secondary | ICD-10-CM | POA: Diagnosis not present

## 2017-07-01 DIAGNOSIS — I10 Essential (primary) hypertension: Secondary | ICD-10-CM | POA: Insufficient documentation

## 2017-07-01 DIAGNOSIS — C641 Malignant neoplasm of right kidney, except renal pelvis: Secondary | ICD-10-CM

## 2017-07-01 DIAGNOSIS — M349 Systemic sclerosis, unspecified: Secondary | ICD-10-CM

## 2017-07-01 DIAGNOSIS — C7931 Secondary malignant neoplasm of brain: Secondary | ICD-10-CM | POA: Diagnosis not present

## 2017-07-01 DIAGNOSIS — E46 Unspecified protein-calorie malnutrition: Secondary | ICD-10-CM | POA: Insufficient documentation

## 2017-07-01 DIAGNOSIS — R29818 Other symptoms and signs involving the nervous system: Secondary | ICD-10-CM | POA: Diagnosis not present

## 2017-07-01 DIAGNOSIS — R5383 Other fatigue: Secondary | ICD-10-CM | POA: Diagnosis not present

## 2017-07-01 DIAGNOSIS — Z7189 Other specified counseling: Secondary | ICD-10-CM

## 2017-07-01 MED ORDER — AMLODIPINE BESYLATE 2.5 MG PO TABS: 2.5000 mg | ORAL_TABLET | Freq: Every day | ORAL | 3 refills | Status: DC

## 2017-07-01 MED ORDER — CABOZANTINIB S-MALATE 40 MG PO TABS: 40.0000 mg | ORAL_TABLET | Freq: Every day | ORAL | 3 refills | Status: DC

## 2017-07-01 NOTE — Telephone Encounter (Signed)
Oral Chemotherapy Pharmacist Encounter  Patient Education I spoke with patient for overview of new oral chemotherapy medication following his office visit. Reviewed Cabometyx (cabozantinib) for the treatment of metastatic renal cell carcimona, planned duration until disease progression or unacceptable drug toxicity.   Pt is doing well. Counseled patient on administration, dosing, side effects, monitoring, drug-food interactions, safe handling, storage, and disposal. Patient will take 1 tablet (40 mg total) by mouth daily. Take on an empty stomach, 1 hour before or 2 hours after meals.  Side effects include but not limited to: hand foot syndrome, diarrhea, N/V, fatigue, decreased appetite.    Reviewed with patient importance of keeping a medication schedule and plan for any missed doses.  Thomas Le and his wife voiced understanding and appreciation. All questions answered. Medication handout was provided and consent was obtained.   Provided patient with [facility] phone number. Patient knows to call the office with questions or concerns. Oral Chemotherapy Navigation Clinic will continue to follow.  Darl Pikes, PharmD, BCPS Hematology/Oncology Clinical Pharmacist [facility] [phone]  07/01/2017 4:34 PM

## 2017-07-01 NOTE — Assessment & Plan Note (Addendum)
#  METASTATIC RENAL CELL CA- GOOD RISK- s/p resection of LUL lung nodule; stage IV [ right lower lobe lung nodule]. Sutent July 2018- held sec to intol.  #May 2019 CT scan shows-bilateral progressive lung nodules; left rib metastases/soft tissue lesions.  MRI of the abdomen-4 to 5 cm liver lesion status post biopsy-positive for RCC [[facility]]  #Recommend starting the patient on Cabo  40 mg a day.  Discussed the potential side effects including but not limited to diarrhea/fatigue/wound healing problems.  Patient understands treatments are palliative not curative.  #Brain met/hydrocephalus status post resection.   #PEG tube explantation; refer to IR/evaluation with Almyra Free.  # COPD/smoking- Continues Advair. Unfortunately, continues to smoke.   # Scleroderma; right foot/toe ulceration-secondary to scleroderma.  Currently off Norvasc.  Restart patient on Norvasc; recommend evaluation with Dr. Jefm Bryant.   #Active smoker-recommend quitting smoking.   #Follow-up with me in approximately 3 weeks labs.  # I reviewed the blood work- with the patient in detail; also reviewed the imaging independently [as summarized above]; and with the patient in detail.   # 40 minutes face-to-face with the patient discussing the above plan of care; more than 50% of time spent on prognosis/ natural history; counseling and coordination.

## 2017-07-01 NOTE — Telephone Encounter (Signed)
Oral Oncology Pharmacist Encounter  Received new prescription for Cabometyx (cabozantinib) for the treatment of renal cell carcinoma, planned duration until disease progression or unacceptable drug toxicity.  BP from 07/01/17 assessed, BP slightly low Cabometyx may increase BP which may work in his favor. We will continue to monitor BP. Prescription dose and frequency assessed.   Current medication list in Epic reviewed, no DDIs with Cabometyx identified.  Prescription has been e-scribed to the [facility] for benefits analysis and approval.  Oral Oncology Clinic will continue to follow for insurance authorization, copayment issues, initial counseling and start date.  Darl Pikes, PharmD, BCPS Hematology/Oncology Clinical Pharmacist [facility] [phone]  07/01/2017 4:23 PM

## 2017-07-01 NOTE — Progress Notes (Signed)
[facility] OFFICE PROGRESS NOTE  Patient Care Team: Leone Haven, MD as PCP - General (Family Medicine)  Cancer Staging No matching staging information was found for the patient.   Oncology History   # JULY 2017 METASTATIC RCC- GOOD RISK [s/p LUL lung section;Dr.Oaks]; RLL- nodule ~10m  # June 2017- ? Frontal lesion on CT [cannot have MRI]  # RIGHT KIDNEY CANCER [incidental 2012; diverticulitis] pT3a (4.3x4.3x 3.2cm) [[facility] clear cell G-2; Neg margins; May 2012 ]  # July 20th- PAZOPANIB 4 pills/day; Discontinued sec to Elevated LFTs.   # OCT 2017- Start Sunitinib 2w-On; 1 w-OFF; CT NOV 13th- RLL- Improved; no new disease. MARCH 7th CT- CR. SUTENT-HOLD [since July 2018-sec to fatigue/poor tol]  # march 2019- Brain met [s/p resection/ s/p VP shunt; Dr.Jaikumar]; Bx of liver lesion- RCC [[facility]]  # May 12th 2019- Cabo 40 mg/day  # ? Scleroderma [Almodipine; Dr.Kernodle]- CONTRAINDICATION to IMMUNOTHERAPY.      Primary cancer of right kidney with metastasis from kidney to other site ([facility]      INTERVAL HISTORY:  Thomas FRICK89y.o.  male pleasant patient above history of metastatic clear cell renal cell carcinoma currently off Sutent since July 2018 secondary intolerance is here for follow-up/review the results of his restaging CAT scan/MRI of the abdomen.  He denies any abdominal pain.  Denies any nausea vomiting.  Denies any headaches.  Does admit to eating well.  Wants to have the PEG tube taken out.  He complains of pain/ulceration of the right middle toe.  Review of Systems  Constitutional: Positive for malaise/fatigue. Negative for chills, diaphoresis, fever and weight loss.  HENT: Negative for nosebleeds and sore throat.   Eyes: Negative for double vision.  Respiratory: Negative for cough, hemoptysis, sputum production, shortness of breath and wheezing.   Cardiovascular: Negative for chest pain, palpitations, orthopnea and leg swelling.   Gastrointestinal: Negative for abdominal pain, blood in stool, constipation, diarrhea, heartburn, melena, nausea and vomiting.  Genitourinary: Negative for dysuria, frequency and urgency.  Musculoskeletal: Negative for back pain and joint pain.  Skin: Negative.  Negative for itching and rash.  Neurological: Negative for dizziness, tingling, focal weakness, weakness and headaches.  Endo/Heme/Allergies: Does not bruise/bleed easily.  Psychiatric/Behavioral: Negative for depression. The patient is nervous/anxious. The patient does not have insomnia.       PAST MEDICAL HISTORY :  Past Medical History:  Diagnosis Date  . Allergic rhinitis   . Anxiety   . Arthritis   . Asthma   . Chronic bronchitis ([place]   . Colitis    Had blood in his stool with this and treated in the hospital  . Colitis cystica profunda   . Colon polyps   . Complication of anesthesia    when waking up get claustophobic with mask, anxiety, panic  . Depression   . Diverticulitis   . ED (erectile dysfunction)   . Genital herpes   . Hypertension   . Kidney disease   . Medical history non-contributory   . Occipital lymphadenopathy   . Peripheral vascular disease ([place]   . [place]   . Shortness of breath dyspnea     PAST SURGICAL HISTORY :   Past Surgical History:  Procedure Laterality Date  . APPENDECTOMY  1966  . COLON SURGERY    . KIDNEY SURGERY Right 2012   Nephrectomy  . PARTIAL COLECTOMY  2012   For perforated colon related to diverticulitis- Dr. BMarina Gravel . VASECTOMY  2000  . VIDEO ASSISTED THORACOSCOPY (VATS)/THOROCOTOMY Left 08/13/2015   Procedure: LEFT THOROCOTOMY WITH LEFT UPPER LOBECTOMY, PREOP BRONCHOSCOPY;  Surgeon: Nestor Lewandowsky, MD;  Location: [facility] ORS;  Service: General;  Laterality: Left;    FAMILY HISTORY :   Family History  Problem Relation Age of Onset  . Arthritis/Rheumatoid Unknown        Parent  . Heart disease Unknown        Grandparent  . Alcoholism Unknown         Other relative  . Arthritis Mother        Rhematoid    SOCIAL HISTORY:   Social History   Tobacco Use  . Smoking status: Current Every Day Smoker    Packs/day: 0.25    Years: 30.00    Pack years: 7.50    Types: Cigarettes  . Smokeless tobacco: Never Used  Substance Use Topics  . Alcohol use: Yes    Alcohol/week: 3.0 oz    Types: 5 Cans of beer per week    Comment: 5 beers a night   . Drug use: No    ALLERGIES:  has No Known Allergies.  MEDICATIONS:  Current Outpatient Medications  Medication Sig Dispense Refill  . acetaminophen (TYLENOL) 500 MG tablet Take 500 mg by mouth every 6 (six) hours as needed for mild pain.    . meloxicam (MOBIC) 7.5 MG tablet Take 1 tablet by mouth daily.    Marland Kitchen amLODipine (NORVASC) 2.5 MG tablet Take 1 tablet (2.5 mg total) by mouth daily. 60 tablet 3  . cabozantinib (CABOMETYX) 40 MG tablet Take 1 tablet (40 mg total) by mouth daily. Take on an empty stomach, 1 hour before or 2 hours after meals. 30 tablet 3   No current facility-administered medications for this visit.     PHYSICAL EXAMINATION: ECOG PERFORMANCE STATUS: 1 - Symptomatic but completely ambulatory  BP 98/60   Pulse 70   Temp 97.8 F (36.6 C) (Tympanic)   Resp 16   Ht '5\' 9"'  (1.753 m)   Wt 139 lb (63 kg)   BMI 20.53 kg/m   Filed Weights   07/01/17 1137  Weight: 139 lb (63 kg)    GENERAL: Well-nourished well-developed; Alert, no distress and comfortable.  Accompanied by wife.  EYES: no pallor or icterus OROPHARYNX: no thrush or ulceration; NECK: supple; no lymph nodes felt. LYMPH:  no palpable lymphadenopathy in the axillary or inguinal regions LUNGS: Decreased breath sounds auscultation bilaterally. No wheeze or crackles HEART/CVS: regular rate & rhythm and no murmurs; No lower extremity edema ABDOMEN:abdomen soft, non-tender and normal bowel sounds. No hepatomegaly or splenomegaly.  Musculoskeletal:no cyanosis of digits and no clubbing  PSYCH: alert & oriented x 3  with fluent speech NEURO: no focal motor/sensory deficits SKIN: Ulcerated lesion noted tip of the right toe.    LABORATORY DATA:  I have reviewed the data as listed    Component Value Date/Time   NA 136 06/22/2017 1506   NA 137 12/08/2012 0529   K 4.8 06/22/2017 1506   K 4.0 12/08/2012 0529   CL 103 06/22/2017 1506   CL 106 12/08/2012 0529   CO2 23 06/22/2017 1506   CO2 24 12/08/2012 0529   GLUCOSE 67 06/22/2017 1506   GLUCOSE 76 12/08/2012 0529   BUN 29 (H) 06/22/2017 1506   BUN 6 (L) 12/08/2012 0529   CREATININE 0.87 06/22/2017 1506   CREATININE 1.11 12/08/2012 0529   CALCIUM 9.2 06/22/2017 1506   CALCIUM 8.1 (L) 12/08/2012  0529   PROT 7.8 06/22/2017 1506   PROT 6.9 12/06/2012 0901   ALBUMIN 4.0 06/22/2017 1506   ALBUMIN 3.5 12/06/2012 0901   AST 18 06/22/2017 1506   AST 13 (L) 12/06/2012 0901   ALT 11 (L) 06/22/2017 1506   ALT 15 12/06/2012 0901   ALKPHOS 70 06/22/2017 1506   ALKPHOS 73 12/06/2012 0901   BILITOT 0.3 06/22/2017 1506   BILITOT 0.8 12/06/2012 0901   GFRNONAA >60 06/22/2017 1506   GFRNONAA >60 12/08/2012 0529   GFRAA >60 06/22/2017 1506   GFRAA >60 12/08/2012 0529    No results found for: SPEP, UPEP  Lab Results  Component Value Date   WBC 7.7 06/22/2017   NEUTROABS 4.3 06/22/2017   HGB 12.6 (L) 06/22/2017   HCT 38.1 (L) 06/22/2017   MCV 93.1 06/22/2017   PLT 311 06/22/2017      Chemistry      Component Value Date/Time   NA 136 06/22/2017 1506   NA 137 12/08/2012 0529   K 4.8 06/22/2017 1506   K 4.0 12/08/2012 0529   CL 103 06/22/2017 1506   CL 106 12/08/2012 0529   CO2 23 06/22/2017 1506   CO2 24 12/08/2012 0529   BUN 29 (H) 06/22/2017 1506   BUN 6 (L) 12/08/2012 0529   CREATININE 0.87 06/22/2017 1506   CREATININE 1.11 12/08/2012 0529      Component Value Date/Time   CALCIUM 9.2 06/22/2017 1506   CALCIUM 8.1 (L) 12/08/2012 0529   ALKPHOS 70 06/22/2017 1506   ALKPHOS 73 12/06/2012 0901   AST 18 06/22/2017 1506   AST 13 (L)  12/06/2012 0901   ALT 11 (L) 06/22/2017 1506   ALT 15 12/06/2012 0901   BILITOT 0.3 06/22/2017 1506   BILITOT 0.8 12/06/2012 0901       RADIOGRAPHIC STUDIES: I have personally reviewed the radiological images as listed and agreed with the findings in the report. No results found.   ASSESSMENT & PLAN:  Primary cancer of right kidney with metastasis from kidney to other site [facility]) # METASTATIC RENAL CELL CA- GOOD RISK- s/p resection of LUL lung nodule; stage IV [ right lower lobe lung nodule]. Sutent July 2018- held sec to intol.  #May 2019 CT scan shows-bilateral progressive lung nodules; left rib metastases/soft tissue lesions.  MRI of the abdomen-4 to 5 cm liver lesion status post biopsy-positive for RCC [[facility]]  #Recommend starting the patient on Cabo  40 mg a day.  Discussed the potential side effects including but not limited to diarrhea/fatigue/wound healing problems.  Patient understands treatments are palliative not curative.  #Brain met/hydrocephalus status post resection.   #PEG tube explantation; refer to IR/evaluation with Almyra Free.  # COPD/smoking- Continues Advair. Unfortunately, continues to smoke.   # Scleroderma; right foot/toe ulceration-secondary to scleroderma.  Currently off Norvasc.  Restart patient on Norvasc; recommend evaluation with Dr. Jefm Bryant.   #Active smoker-recommend quitting smoking.   #Follow-up with me in approximately 3 weeks labs.  # I reviewed the blood work- with the patient in detail; also reviewed the imaging independently [as summarized above]; and with the patient in detail.   # 40 minutes face-to-face with the patient discussing the above plan of care; more than 50% of time spent on prognosis/ natural history; counseling and coordination.      No orders of the defined types were placed in this encounter.  All questions were answered. The patient knows to call the clinic with any problems, questions or concerns.  Cammie Sickle, MD 07/05/2017 7:34 PM

## 2017-07-02 ENCOUNTER — Inpatient Hospital Stay: Payer: BLUE CROSS/BLUE SHIELD

## 2017-07-02 ENCOUNTER — Encounter: Payer: Self-pay | Admitting: Internal Medicine

## 2017-07-02 ENCOUNTER — Telehealth: Payer: Self-pay | Admitting: Pharmacy Technician

## 2017-07-02 NOTE — Progress Notes (Signed)
Nutrition  Patient wanted to reschedule nutrition appointment today to an afternoon appointment.  Nutrition appointment rescheduled to 5/16 at 2:30pm.    [name], [place], [place] Registered Dietitian [phone] (pager)

## 2017-07-02 NOTE — Telephone Encounter (Signed)
Oral Oncology Patient Advocate Encounter  Received notification from [facility] that prior authorization for Cabometyx is required.  PA submitted on CoverMyMeds Key [id number] Status is pending  Oral Oncology Clinic will continue to follow.  Fabio Asa. Melynda Keller, [place] [facility] [place] [phone] 07/02/2017 11:42 AM

## 2017-07-05 DIAGNOSIS — Z7189 Other specified counseling: Secondary | ICD-10-CM | POA: Insufficient documentation

## 2017-07-06 ENCOUNTER — Telehealth: Payer: Self-pay | Admitting: *Deleted

## 2017-07-06 MED ORDER — CABOZANTINIB S-MALATE 40 MG PO TABS: 40.0000 mg | ORAL_TABLET | Freq: Every day | ORAL | 3 refills | Status: DC

## 2017-07-06 NOTE — Telephone Encounter (Signed)
Oral Chemotherapy Pharmacist Encounter   Due to insurance restriction, prescription for Cabometyx will be sent to [facility]. Called and spoke with patient's wife to let her know.   Darl Pikes, PharmD, BCPS Hematology/Oncology Clinical Pharmacist [facility] [phone]  07/06/2017 10:44 AM

## 2017-07-06 NOTE — Telephone Encounter (Signed)
[facility] called to advise that the Medication for this patient had been approved and there is no co-pay. As soon as pharmacist verifies medication they will notify patient of shipment.

## 2017-07-06 NOTE — Telephone Encounter (Signed)
Alyson- please see phone msg below-

## 2017-07-06 NOTE — Telephone Encounter (Signed)
Oral Oncology Pharmacist Encounter   Prior Authorization for Cabometyx has been approved.     Effective dates: 07/02/17 through 07/01/18   Oral Oncology Clinic will continue to follow.   Darl Pikes, PharmD, BCPS Hematology/Oncology Clinical Pharmacist [facility] [phone]  07/06/2017 10:55 AM

## 2017-07-07 ENCOUNTER — Ambulatory Visit (INDEPENDENT_AMBULATORY_CARE_PROVIDER_SITE_OTHER): Payer: BLUE CROSS/BLUE SHIELD | Admitting: Family Medicine

## 2017-07-07 ENCOUNTER — Encounter: Payer: Self-pay | Admitting: Family Medicine

## 2017-07-07 ENCOUNTER — Ambulatory Visit (INDEPENDENT_AMBULATORY_CARE_PROVIDER_SITE_OTHER): Payer: BLUE CROSS/BLUE SHIELD

## 2017-07-07 VITALS — BP 102/60 | Ht 69.0 in | Wt 142.4 lb

## 2017-07-07 DIAGNOSIS — M79674 Pain in right toe(s): Secondary | ICD-10-CM | POA: Diagnosis not present

## 2017-07-07 DIAGNOSIS — C641 Malignant neoplasm of right kidney, except renal pelvis: Secondary | ICD-10-CM

## 2017-07-07 DIAGNOSIS — Z720 Tobacco use: Secondary | ICD-10-CM | POA: Diagnosis not present

## 2017-07-07 DIAGNOSIS — R0989 Other specified symptoms and signs involving the circulatory and respiratory systems: Secondary | ICD-10-CM

## 2017-07-07 DIAGNOSIS — M349 Systemic sclerosis, unspecified: Secondary | ICD-10-CM

## 2017-07-07 NOTE — Assessment & Plan Note (Signed)
Patient continues to follow with oncology and his surgeon at [facility].  Seems to have been putting some weight on.  Overall seems to be doing relatively well given everything that has gone on.  He will continue to follow with oncology.

## 2017-07-07 NOTE — Progress Notes (Signed)
Opened in error.  See other encounter.

## 2017-07-07 NOTE — Progress Notes (Signed)
Thomas Rumps, MD Phone: [phone]  Thomas Le is a 62 y.o. male who presents today for f/u.  Lost to follow-up over the past 2 years.  Over that period of time he has been found to have metastatic clear cell renal cell carcinoma.  He was found to have brain metastases and underwent removal with placement of VP shunt.  It sounds as though he had normal pressure hydrocephalus as he was having gait abnormalities and urinating on himself.  He had been falling.  He had a fall after shunt placement with head injury that resulted in need for additional surgery to fix the shunt.  He reports he was airlifted to [facility] for that to be done.  His wife reports he was in the hospital and in rehab for a total of 60 odd days.  He has done well since the VP shunt was fixed.  No additional falls or urinating on himself.  He has a PEG tube in place.  He has been eating well and has gained 12 pounds.  He is seeing nutrition soon and then will see GI to consider removal of the PEG tube.  He has overall felt weak and deconditioned.  He has been doing physical therapy to help with that.  They are going to place him on a new chemotherapeutic drug.  Scleroderma: He notes his Norvasc was stopped at some point when he was hospitalized.  He has developed a spot on the tip of his right great toe.  This has been present for months and he reports was evaluated by the physicians in the hospital.  His oncologist recently evaluated and placed him back on Norvasc.  He has not seen rheumatology in some time.  He continues to smoke.  He does have COPD.  He is down to 5 cigarettes/day from close to a pack per day.  His breathing is stable.   Social History   Tobacco Use  Smoking Status Current Every Day Smoker  . Packs/day: 0.25  . Years: 30.00  . Pack years: 7.50  . Types: Cigarettes  Smokeless Tobacco Never Used     ROS see history of present illness  Objective  Physical Exam Vitals:   07/07/17 1512  BP:  102/60  Pulse: 61  Temp: 97.6 F (36.4 C)  SpO2: 97%    BP Readings from Last 3 Encounters:  07/07/17 102/60  07/07/17 102/60  07/01/17 98/60   Wt Readings from Last 3 Encounters:  07/07/17 142 lb (64.4 kg)  07/07/17 142 lb 6.4 oz (64.6 kg)  07/01/17 139 lb (63 kg)    Physical Exam  Constitutional: No distress.  Cardiovascular: Normal rate, regular rhythm and normal heart sounds.  Pulmonary/Chest: Effort normal and breath sounds normal.  Musculoskeletal: He exhibits no edema.  Neurological: He is alert.  Skin: Skin is warm and dry. He is not diaphoretic.  There is a small dry ulceration at the tip of his right great toe with some tenderness, no drainage, no erythema  Difficult to palpate bilateral pedal pulses, feet do feel warm   Assessment/Plan: Please see individual problem list.  Primary cancer of right kidney with metastasis from kidney to other site [facility]) Patient continues to follow with oncology and his surgeon at [facility].  Seems to have been putting some weight on.  Overall seems to be doing relatively well given everything that has gone on.  He will continue to follow with oncology.  Scleroderma ([place]) The right great toe lesion is likely related  to his scleroderma.  His pulses are little difficult to palpate and thus we will refer to vascular surgery for evaluation.  He will see his rheumatologist as well for evaluation.  He will continue the Norvasc.  We will obtain an x-ray to evaluate for underlying bony issue.  He is given return precautions.  Tobacco abuse Continues to smoke.  Breathing has been stable.  He will continue to cut down on his cigarette use.   No orders of the defined types were placed in this encounter.   No orders of the defined types were placed in this encounter.    Thomas Rumps, MD [place] Started

## 2017-07-07 NOTE — Assessment & Plan Note (Addendum)
The right great toe lesion is likely related to his scleroderma.  His pulses are little difficult to palpate and thus we will refer to vascular surgery for evaluation.  He will see his rheumatologist as well for evaluation.  He will continue the Norvasc.  We will obtain an x-ray to evaluate for underlying bony issue.  He is given return precautions.

## 2017-07-07 NOTE — Assessment & Plan Note (Signed)
Continues to smoke.  Breathing has been stable.  He will continue to cut down on his cigarette use.

## 2017-07-09 ENCOUNTER — Ambulatory Visit
Admission: RE | Admit: 2017-07-09 | Discharge: 2017-07-09 | Disposition: A | Discharge status: Home / Self Care | Payer: BLUE CROSS/BLUE SHIELD | Source: Ambulatory Visit | Attending: Nurse Practitioner | Admitting: Nurse Practitioner

## 2017-07-09 ENCOUNTER — Inpatient Hospital Stay: Payer: BLUE CROSS/BLUE SHIELD

## 2017-07-09 ENCOUNTER — Inpatient Hospital Stay (HOSPITAL_BASED_OUTPATIENT_CLINIC_OR_DEPARTMENT_OTHER): Payer: BLUE CROSS/BLUE SHIELD | Admitting: Nurse Practitioner

## 2017-07-09 VITALS — BP 101/69 | HR 71 | Temp 98.0°F | Resp 18

## 2017-07-09 DIAGNOSIS — C787 Secondary malignant neoplasm of liver and intrahepatic bile duct: Secondary | ICD-10-CM | POA: Diagnosis not present

## 2017-07-09 DIAGNOSIS — C641 Malignant neoplasm of right kidney, except renal pelvis: Secondary | ICD-10-CM

## 2017-07-09 DIAGNOSIS — C7931 Secondary malignant neoplasm of brain: Secondary | ICD-10-CM | POA: Diagnosis not present

## 2017-07-09 DIAGNOSIS — C7802 Secondary malignant neoplasm of left lung: Secondary | ICD-10-CM | POA: Diagnosis not present

## 2017-07-09 DIAGNOSIS — Z902 Acquired absence of lung [part of]: Secondary | ICD-10-CM | POA: Diagnosis not present

## 2017-07-09 DIAGNOSIS — R5383 Other fatigue: Secondary | ICD-10-CM | POA: Diagnosis not present

## 2017-07-09 DIAGNOSIS — R0781 Pleurodynia: Secondary | ICD-10-CM | POA: Diagnosis not present

## 2017-07-09 DIAGNOSIS — Z931 Gastrostomy status: Secondary | ICD-10-CM

## 2017-07-09 DIAGNOSIS — J449 Chronic obstructive pulmonary disease, unspecified: Secondary | ICD-10-CM | POA: Diagnosis not present

## 2017-07-09 DIAGNOSIS — F17210 Nicotine dependence, cigarettes, uncomplicated: Secondary | ICD-10-CM

## 2017-07-09 DIAGNOSIS — E46 Unspecified protein-calorie malnutrition: Secondary | ICD-10-CM | POA: Diagnosis not present

## 2017-07-09 DIAGNOSIS — M349 Systemic sclerosis, unspecified: Secondary | ICD-10-CM

## 2017-07-09 DIAGNOSIS — R634 Abnormal weight loss: Secondary | ICD-10-CM | POA: Diagnosis not present

## 2017-07-09 DIAGNOSIS — R5381 Other malaise: Secondary | ICD-10-CM

## 2017-07-09 DIAGNOSIS — I10 Essential (primary) hypertension: Secondary | ICD-10-CM | POA: Diagnosis not present

## 2017-07-09 DIAGNOSIS — I739 Peripheral vascular disease, unspecified: Secondary | ICD-10-CM

## 2017-07-09 DIAGNOSIS — M1990 Unspecified osteoarthritis, unspecified site: Secondary | ICD-10-CM

## 2017-07-09 DIAGNOSIS — K9429 Other complications of gastrostomy: Secondary | ICD-10-CM

## 2017-07-09 DIAGNOSIS — Z79899 Other long term (current) drug therapy: Secondary | ICD-10-CM

## 2017-07-09 MED ORDER — TRAMADOL HCL 50 MG PO TABS
50.0000 mg | ORAL_TABLET | Freq: Four times a day (QID) | ORAL | 0 refills | Status: DC | PRN
Start: 2017-07-09 — End: 2017-10-21

## 2017-07-09 NOTE — Progress Notes (Signed)
Symptom Management Clinic [facility]  Telephone:([phone] [phone]  Patient Care Team: Glori Luis, MD as PCP - General (Family Medicine)   Name of the patient: Thomas Le  [id number]  1955-09-13   Date of visit: 07/10/17  Diagnosis- Metastatic Renal   Chief complaint/ Reason for visit-left rib pain and peg tube complication  Heme/Onc history: Patient last evaluated by primary oncologist, Dr. Donneta Romberg, on 07/01/2017, for discussion of imaging results.  He has below history of metastatic renal cell carcinoma, currently off Sutent since July 2018, secondary to intolerance.  May 2019 CT scan showed progression of bilateral lung nodules and left rib metastasis and soft tissue lesions.  MRI of abdomen-4 to 5 cm liver lesion status post biopsy positive for renal cell carcinoma at [facility].  Medical oncology starting patient on Cabo 40 mg daily with discussion of potential side effects including possible diarrhea/fatigue/wound healing problems.  Patient understanding treatments given with palliative versus curative intent. Brain metastases s/p resection. PEG tube in place for supplemental nutrition.   Oncology History   # JULY 2017 METASTATIC RCC- GOOD RISK [s/p LUL lung section;Dr.Oaks]; RLL- nodule ~61mm  # June 2017- ? Frontal lesion on CT [cannot have MRI]  # RIGHT KIDNEY CANCER [incidental 2012; diverticulitis] pT3a (4.3x4.3x 3.2cm) [facility]- clear cell G-2; Neg margins; May 2012 ]  # July 20th- PAZOPANIB 4 pills/day; Discontinued sec to Elevated LFTs.   # OCT 2017- Start Sunitinib 2w-On; 1 w-OFF; CT NOV 13th- RLL- Improved; no new disease. MARCH 7th CT- CR. SUTENT-HOLD [since July 2018-sec to fatigue/poor tol]  # march 2019- Brain met [s/p resection/ s/p VP shunt; Dr.Jaikumar]; Bx of liver lesion- RCC [[facility]]  # May 12th 2019- Cabo 40 mg/day  # ? Scleroderma [Almodipine; Dr.Kernodle]- CONTRAINDICATION to IMMUNOTHERAPY.      Primary cancer of  right kidney with metastasis from kidney to other site [facility])    Interval history- patient presents to symptom management clinic today for concerns of left\rib pain that started last night while patient twisting/moving.  He states that he heard a snap/pop and felt pain at the site.  Has not noticed swelling.  Area tender to palpation.  Pain does not radiate.  He describes the pain is severe and interferes with sleep and comfort.  He has taken Tylenol for pain.  No other associated symptoms.  He cites concern that pain may be related to PEG tube and/or shunt.  Patient was initially in clinic for evaluation with Alphonse Guild, dietitian, for evaluation of PEG tube and weight loss.  PEG tube was placed at [facility] on 04/17/2017 and he was followed by speech-language pathology and on dysphagia 3 diet with thin liquids.  Wife reporting patient has not used tube in past 3 weeks and has not been performing regular flushings for site care.  Patient eating normal diet and consumes supplemental ensures for additional calories.  Currently awaiting arrival of new oral chemotherapy drug.  Patient states he is agreeable to performing PEG care and wishes to maintain tube in place at this time.   ECOG FS:1 - Symptomatic but completely ambulatory  Review of systems- Review of Systems  Constitutional: Positive for weight loss. Negative for chills, fever and malaise/fatigue.  HENT: Negative for congestion, ear discharge, ear pain, sinus pain, sore throat and tinnitus.   Eyes: Negative.   Respiratory: Negative.  Negative for cough, sputum production and shortness of breath.   Cardiovascular: Negative for chest pain, palpitations, orthopnea, claudication and leg swelling.  Symptom Management [place]  [phone] [phone]  Patient Care Team: Leone Haven, MD as PCP - General (Family Medicine)   Name of the patient: Thomas Le  [id number]  Nov 25, 1962   Date of visit: 07/10/17  Diagnosis- Metastatic Renal   Chief complaint/ Reason for visit-left rib pain and peg tube complication  Heme/Onc history: Patient last evaluated by primary oncologist, Dr. Rogue Bussing, on 07/01/2017, for discussion of imaging results.  He has below history of metastatic renal cell carcinoma, currently off Sutent since July 2018, secondary to intolerance.  May 2019 CT scan showed progression of bilateral lung nodules and left rib metastasis and soft tissue lesions.  MRI of abdomen-4 to 5 cm liver lesion status post biopsy positive for renal cell carcinoma at [facility].  Medical oncology starting patient on Cabo 40 mg daily with discussion of potential side effects including possible diarrhea/fatigue/wound healing problems.  Patient understanding treatments given with palliative versus curative intent. Brain metastases s/p resection. PEG tube in place for supplemental nutrition.   Oncology History   # JULY 2017 METASTATIC RCC- GOOD RISK [s/p LUL lung section;Dr.Oaks]; RLL- nodule ~6m  # June 2017- ? Frontal lesion on CT [cannot have MRI]  # RIGHT KIDNEY CANCER [incidental 2012; diverticulitis] pT3a (4.3x4.3x 3.2cm) [[facility] clear cell G-2; Neg margins; May 2012 ]  # July 20th- PAZOPANIB 4 pills/day; Discontinued sec to Elevated LFTs.   # OCT 2017- Start Sunitinib 2w-On; 1 w-OFF; CT NOV 13th- RLL- Improved; no new disease. MARCH 7th CT- CR. SUTENT-HOLD [since July 2018-sec to fatigue/poor tol]  # march 2019- Brain met [s/p resection/ s/p VP shunt; Dr.Jaikumar]; Bx of liver lesion- RCC [[facility]]  # May 12th 2019- Cabo 40 mg/day  # ? Scleroderma [Almodipine; Dr.Kernodle]- CONTRAINDICATION to IMMUNOTHERAPY.      Primary cancer of  right kidney with metastasis from kidney to other site ([facility]    Interval history- patient presents to symptom management clinic today for concerns of left\rib pain that started last night while patient twisting/moving.  He states that he heard a snap/pop and felt pain at the site.  Has not noticed swelling.  Area tender to palpation.  Pain does not radiate.  He describes the pain is severe and interferes with sleep and comfort.  He has taken Tylenol for pain.  No other associated symptoms.  He cites concern that pain may be related to PEG tube and/or shunt.  Patient was initially in clinic for evaluation with JJennet Maduro dietitian, for evaluation of PEG tube and weight loss.  [facility] and he was followed by speech-language pathology and on dysphagia 3 diet with thin liquids.  Wife reporting patient has not used tube in past 3 weeks and has not been performing regular flushings for site care.  Patient eating normal diet and consumes supplemental ensures for additional calories.  Currently awaiting arrival of new oral chemotherapy drug.  Patient states he is agreeable to performing PEG care and wishes to maintain tube in place at this time.   ECOG FS:1 - Symptomatic but completely ambulatory  Review of systems- Review of Systems  Constitutional: Positive for weight loss. Negative for chills, fever and malaise/fatigue.  HENT: Negative for congestion, ear discharge, ear pain, sinus pain, sore throat and tinnitus.   Eyes: Negative.   Respiratory: Negative.  Negative for cough, sputum production and shortness of breath.   Cardiovascular: Negative for chest pain, palpitations, orthopnea, claudication and leg swelling.  Symptom Management [place]  [phone] [phone]  Patient Care Team: Leone Haven, MD as PCP - General (Family Medicine)   Name of the patient: Thomas Le  [id number]  Nov 25, 1962   Date of visit: 07/10/17  Diagnosis- Metastatic Renal   Chief complaint/ Reason for visit-left rib pain and peg tube complication  Heme/Onc history: Patient last evaluated by primary oncologist, Dr. Rogue Bussing, on 07/01/2017, for discussion of imaging results.  He has below history of metastatic renal cell carcinoma, currently off Sutent since July 2018, secondary to intolerance.  May 2019 CT scan showed progression of bilateral lung nodules and left rib metastasis and soft tissue lesions.  MRI of abdomen-4 to 5 cm liver lesion status post biopsy positive for renal cell carcinoma at [facility].  Medical oncology starting patient on Cabo 40 mg daily with discussion of potential side effects including possible diarrhea/fatigue/wound healing problems.  Patient understanding treatments given with palliative versus curative intent. Brain metastases s/p resection. PEG tube in place for supplemental nutrition.   Oncology History   # JULY 2017 METASTATIC RCC- GOOD RISK [s/p LUL lung section;Dr.Oaks]; RLL- nodule ~6m  # June 2017- ? Frontal lesion on CT [cannot have MRI]  # RIGHT KIDNEY CANCER [incidental 2012; diverticulitis] pT3a (4.3x4.3x 3.2cm) [[facility] clear cell G-2; Neg margins; May 2012 ]  # July 20th- PAZOPANIB 4 pills/day; Discontinued sec to Elevated LFTs.   # OCT 2017- Start Sunitinib 2w-On; 1 w-OFF; CT NOV 13th- RLL- Improved; no new disease. MARCH 7th CT- CR. SUTENT-HOLD [since July 2018-sec to fatigue/poor tol]  # march 2019- Brain met [s/p resection/ s/p VP shunt; Dr.Jaikumar]; Bx of liver lesion- RCC [[facility]]  # May 12th 2019- Cabo 40 mg/day  # ? Scleroderma [Almodipine; Dr.Kernodle]- CONTRAINDICATION to IMMUNOTHERAPY.      Primary cancer of  right kidney with metastasis from kidney to other site ([facility]    Interval history- patient presents to symptom management clinic today for concerns of left\rib pain that started last night while patient twisting/moving.  He states that he heard a snap/pop and felt pain at the site.  Has not noticed swelling.  Area tender to palpation.  Pain does not radiate.  He describes the pain is severe and interferes with sleep and comfort.  He has taken Tylenol for pain.  No other associated symptoms.  He cites concern that pain may be related to PEG tube and/or shunt.  Patient was initially in clinic for evaluation with JJennet Maduro dietitian, for evaluation of PEG tube and weight loss.  [facility] and he was followed by speech-language pathology and on dysphagia 3 diet with thin liquids.  Wife reporting patient has not used tube in past 3 weeks and has not been performing regular flushings for site care.  Patient eating normal diet and consumes supplemental ensures for additional calories.  Currently awaiting arrival of new oral chemotherapy drug.  Patient states he is agreeable to performing PEG care and wishes to maintain tube in place at this time.   ECOG FS:1 - Symptomatic but completely ambulatory  Review of systems- Review of Systems  Constitutional: Positive for weight loss. Negative for chills, fever and malaise/fatigue.  HENT: Negative for congestion, ear discharge, ear pain, sinus pain, sore throat and tinnitus.   Eyes: Negative.   Respiratory: Negative.  Negative for cough, sputum production and shortness of breath.   Cardiovascular: Negative for chest pain, palpitations, orthopnea, claudication and leg swelling.  Symptom Management [place]  [phone] [phone]  Patient Care Team: Leone Haven, MD as PCP - General (Family Medicine)   Name of the patient: Thomas Le  [id number]  Nov 25, 1962   Date of visit: 07/10/17  Diagnosis- Metastatic Renal   Chief complaint/ Reason for visit-left rib pain and peg tube complication  Heme/Onc history: Patient last evaluated by primary oncologist, Dr. Rogue Bussing, on 07/01/2017, for discussion of imaging results.  He has below history of metastatic renal cell carcinoma, currently off Sutent since July 2018, secondary to intolerance.  May 2019 CT scan showed progression of bilateral lung nodules and left rib metastasis and soft tissue lesions.  MRI of abdomen-4 to 5 cm liver lesion status post biopsy positive for renal cell carcinoma at [facility].  Medical oncology starting patient on Cabo 40 mg daily with discussion of potential side effects including possible diarrhea/fatigue/wound healing problems.  Patient understanding treatments given with palliative versus curative intent. Brain metastases s/p resection. PEG tube in place for supplemental nutrition.   Oncology History   # JULY 2017 METASTATIC RCC- GOOD RISK [s/p LUL lung section;Dr.Oaks]; RLL- nodule ~6m  # June 2017- ? Frontal lesion on CT [cannot have MRI]  # RIGHT KIDNEY CANCER [incidental 2012; diverticulitis] pT3a (4.3x4.3x 3.2cm) [[facility] clear cell G-2; Neg margins; May 2012 ]  # July 20th- PAZOPANIB 4 pills/day; Discontinued sec to Elevated LFTs.   # OCT 2017- Start Sunitinib 2w-On; 1 w-OFF; CT NOV 13th- RLL- Improved; no new disease. MARCH 7th CT- CR. SUTENT-HOLD [since July 2018-sec to fatigue/poor tol]  # march 2019- Brain met [s/p resection/ s/p VP shunt; Dr.Jaikumar]; Bx of liver lesion- RCC [[facility]]  # May 12th 2019- Cabo 40 mg/day  # ? Scleroderma [Almodipine; Dr.Kernodle]- CONTRAINDICATION to IMMUNOTHERAPY.      Primary cancer of  right kidney with metastasis from kidney to other site ([facility]    Interval history- patient presents to symptom management clinic today for concerns of left\rib pain that started last night while patient twisting/moving.  He states that he heard a snap/pop and felt pain at the site.  Has not noticed swelling.  Area tender to palpation.  Pain does not radiate.  He describes the pain is severe and interferes with sleep and comfort.  He has taken Tylenol for pain.  No other associated symptoms.  He cites concern that pain may be related to PEG tube and/or shunt.  Patient was initially in clinic for evaluation with JJennet Maduro dietitian, for evaluation of PEG tube and weight loss.  [facility] and he was followed by speech-language pathology and on dysphagia 3 diet with thin liquids.  Wife reporting patient has not used tube in past 3 weeks and has not been performing regular flushings for site care.  Patient eating normal diet and consumes supplemental ensures for additional calories.  Currently awaiting arrival of new oral chemotherapy drug.  Patient states he is agreeable to performing PEG care and wishes to maintain tube in place at this time.   ECOG FS:1 - Symptomatic but completely ambulatory  Review of systems- Review of Systems  Constitutional: Positive for weight loss. Negative for chills, fever and malaise/fatigue.  HENT: Negative for congestion, ear discharge, ear pain, sinus pain, sore throat and tinnitus.   Eyes: Negative.   Respiratory: Negative.  Negative for cough, sputum production and shortness of breath.   Cardiovascular: Negative for chest pain, palpitations, orthopnea, claudication and leg swelling.  estimated to be approximately 7.9 x 7.7 x 5.2 cm (axial image 55 of series 16 and coronal image 16 of series 4). No intra or extrahepatic biliary ductal dilatation. Gallbladder is normal in appearance. A few other scattered T1 hypointense, T2 hyperintense, nonenhancing liver lesions are also noted, subcentimeter in size, likely to represent tiny cysts and/or biliary hamartomas. Pancreas: No pancreatic mass. No pancreatic ductal dilatation. No pancreatic or peripancreatic fluid or inflammatory changes. Spleen:  Unremarkable. Adrenals/Urinary Tract: Status post right nephrectomy. Left kidney is normal in appearance. No hydroureteronephrosis in the visualized portions of the abdomen. Right adrenal gland is normal in appearance. 2.9 x 1.5 cm left adrenal nodule demonstrates diffuse loss of signal intensity on out of phase dual echo images, compatible with an adenoma. Stomach/Bowel: Visualized portions are unremarkable. Vascular/Lymphatic: Extensive atherosclerosis in the visualized abdominal vasculature, without evidence of aneurysm. No lymphadenopathy noted in the abdomen. Other: No significant volume of ascites noted in the visualized portions of the peritoneal cavity. Musculoskeletal: No aggressive appearing osseous lesions are noted in the visualized portions of the skeleton. IMPRESSION: 1. Large infiltrative hypovascular neoplasm with washout and some imaging characteristics concerning for hepatocellular carcinoma. Subtle findings in the liver suggestive of underlying cirrhosis. Correlation with AFP levels is strongly recommended. 2. Left adrenal adenoma again noted. 3. Status post right nephrectomy. Electronically Signed   By: Trudie Reed M.D.   On: 06/30/2017 15:42   Dg Toe Great Right  Result Date: 07/08/2017 CLINICAL DATA:  Toe pain and ulcer tip of great toe EXAM: RIGHT GREAT TOE COMPARISON:  06/24/2010 FINDINGS: There is no evidence of fracture or  dislocation. There is no evidence of arthropathy or other focal bone abnormality. Soft tissues are unremarkable. Negative for osteomyelitis. IMPRESSION: Negative. Electronically Signed   By: Marlan Palau M.D.   On: 07/08/2017 08:43     Assessment and plan- Patient is a 62 y.o. male with metastatic renal cell carcinoma who presents to symptom management clinic for the rib pain and PEG tube site evaluation.   Visit Diagnosis 1. Primary cancer of right kidney with metastasis from kidney to other site [facility])   2. Irritation around percutaneous endoscopic gastrostomy (PEG) tube site (HCC)   3. Rib pain on left side    1.  Metastatic renal cell carcinoma-good risk-status post resection of left upper lobe lung nodule and: Stage IV (right lower lobe lung nodule); brain mass/hydrocephalus-status post resection.  Sutent currently held due to intolerance.  Plan to start Cabometyx per medical oncology upon receipt of medication.  2.  Irritation at PEG tube site- mild crusting and irritation around peg tube insertion. Sutures of outer disc absent. PEG site care provided. Tube flushes easily. Patient provided teach back and demonstrated use of peg tube. He agrees to maintain tube with daily flushing and site care. Discussed calling clinic if concerns or if he wishes to have tube removed.   3.  Rib pain-posterior left side at approximately 10th and 11th rib- Previous ct scan showed lytic lesion at 10th & 11th rib in this location. X-ray negative for fracture. Continue to monitor. Start Lidoderm patches at site and Tramadol for pain uncontrolled by tylenol.   4.  Malnutrition- Patient agreeable to keeping peg in place at this time and following advice of dietitian. Per dietitian, patient will continue to eat orally over next week and follow-up with nutrition regarding weight. Upon reassessment, will decide on supplemental nutrition through PEG.   Follow-up as scheduled with Dr. Donneta Romberg. Patient advised to  notify the clinic if there is no improvement in symptoms or if symptoms worsen in next 3-4 days.   Patient expressed understanding and was in agreement with this plan. He also understands that He can call clinic at any time with any questions, concerns, or complaints.   Thank you for allowing me to participate in the care of this very pleasant patient.   Consuello Masse, DNP, AGNP-C [facility] [phone] (work cell) [phone] (office) 07/10/17 10:28 AM  Cc: Dr. Donneta Romberg

## 2017-07-09 NOTE — Progress Notes (Addendum)
Nutrition Follow-up:  Patient with metastatic renal cell carcinoma. Chart reviewed and noted brain lesion causing hydrocephalus s/p cranial surgery.  PEG tube placed on 2/22 at [facility]. Patient was followed by SLP and on dysphagia 3 with thin liquids diet.  Noted was seen by RD at [facility] on 3/13 and encouraged to start using tube again.    Met with patient and wife in clinic this afternoon.  Wife answered most of the questions.  Wife reports has not used the tube in the last 3 weeks.  Has not flushed the tube.  Wife reports that he ate 3 bowls of cereal this am with whole milk, lunch is usually sandwich and soup and supper is "whatever I cook."  Asked for clarification and wife reports pot roast with vegetables.  Patient reports that he is drinking 3 ensure plus per day.  Drinks tea, powerade.    Reports no issues with nausea. Reports normal bowel movement about 1 time per day.    Noted planning to start new oral chemotherapy drug soon, wife reports to be delivered today.  Noted side effects decreased appetite, nausea, etc.    Nutrition-Focused physical exam completed. Findings are severe rib, severe upper arm, moderate buccal fat depletion,  Moderate temple, mild hand, severe thigh, mild patella, moderate calf muscle depletion, and did not observe edema.     Medications: reviewed  Labs: reviewed  Anthropometrics:   Height: 6'1 inch per patient  Weight: 142 lb 6.4 oz noted at [place] office yesterday UBW: 150 lb in March 2019 before surgery IBW 184 lb with height.   RD visit in 02/11/2016 167 lb BMI: 18 7% weight loss in ithe last 2 months, signficant  Estimated Energy Needs  Kcals: 1900-2200 calories/d Protein: 95-110 g Fluid: 2.2 L/d  NUTRITION DIAGNOSIS: Inadequate oral intake related to cancer and cancer related treatment side effects as evidenced by 7% weight loss and PEG tube placement   INTERVENTION:  Recommend that PEG tube continue to remain in place until side effects known  from new oral chemo drug (can effect nutrition). Also with severe muscle loss and fat loss as well as significant weight loss in the last 2 months.  Patient continues to be under IBW.   Ideally PEG tube should remain in place for 4 weeks or more without patient loosing weight and maintain hydration before removal.  Ultimately patient's choice.  Discussed with patient and wife today.  Encouraged oral intake and discussed calorie goal and sample meal plan given to patient on how to reach that.  Patient planning to eat orally over the next few weeks without using tube to continue to gain weight.  Will re-evaluate in 2 weeks. Patient reports pop and rib, side pain.  Spoke with Ander Purpura, NP and able to see patient today in symptom management.  NP also evaluated PEG tube.      MONITORING, EVALUATION, GOAL: weight trends, intake   NEXT VISIT: May 30 after MD visit  Leyana Whidden B. Zenia Resides, [place], [place] Registered Dietitian [phone] (pager)

## 2017-07-10 ENCOUNTER — Encounter: Payer: Self-pay | Admitting: Nurse Practitioner

## 2017-07-10 MED ORDER — LIDOCAINE 5 % EX PTCH: 1.0000 | MEDICATED_PATCH | CUTANEOUS | 0 refills | Status: DC

## 2017-07-16 ENCOUNTER — Telehealth: Payer: Self-pay | Admitting: *Deleted

## 2017-07-16 NOTE — Telephone Encounter (Signed)
Received incoming fax from [facility]. Lauren wrote script on 07/10/17 for lidoderm patches. This was denied by patient's insurance  "does not meet medical necessity as member is not being treated for pain from shingles."  Lauren/Dr. B - Any recommendations as script was written for lytic lesions? Do I need to try to appeal claim or do you want to try alternative med?

## 2017-07-16 NOTE — Telephone Encounter (Signed)
Looks like a box is $70 through [facility] but that would be the only option for lidoderm patches. I started him on Tramadol though and believe that was helping. We could consider adjusting his dose if he's tolerating it though.

## 2017-07-17 ENCOUNTER — Encounter: Payer: Self-pay | Admitting: *Deleted

## 2017-07-23 ENCOUNTER — Inpatient Hospital Stay: Payer: BLUE CROSS/BLUE SHIELD

## 2017-07-23 ENCOUNTER — Other Ambulatory Visit: Payer: Self-pay

## 2017-07-23 ENCOUNTER — Encounter: Payer: Self-pay | Admitting: Internal Medicine

## 2017-07-23 ENCOUNTER — Telehealth: Payer: Self-pay | Admitting: Internal Medicine

## 2017-07-23 ENCOUNTER — Inpatient Hospital Stay (HOSPITAL_BASED_OUTPATIENT_CLINIC_OR_DEPARTMENT_OTHER): Payer: BLUE CROSS/BLUE SHIELD | Admitting: Internal Medicine

## 2017-07-23 VITALS — BP 105/69 | HR 54 | Temp 97.8°F | Resp 16 | Wt 141.6 lb

## 2017-07-23 DIAGNOSIS — C787 Secondary malignant neoplasm of liver and intrahepatic bile duct: Secondary | ICD-10-CM

## 2017-07-23 DIAGNOSIS — R29818 Other symptoms and signs involving the nervous system: Secondary | ICD-10-CM | POA: Diagnosis not present

## 2017-07-23 DIAGNOSIS — M349 Systemic sclerosis, unspecified: Secondary | ICD-10-CM

## 2017-07-23 DIAGNOSIS — C7802 Secondary malignant neoplasm of left lung: Secondary | ICD-10-CM

## 2017-07-23 DIAGNOSIS — Z931 Gastrostomy status: Secondary | ICD-10-CM | POA: Diagnosis not present

## 2017-07-23 DIAGNOSIS — R634 Abnormal weight loss: Secondary | ICD-10-CM | POA: Diagnosis not present

## 2017-07-23 DIAGNOSIS — I10 Essential (primary) hypertension: Secondary | ICD-10-CM | POA: Diagnosis not present

## 2017-07-23 DIAGNOSIS — R5383 Other fatigue: Secondary | ICD-10-CM | POA: Diagnosis not present

## 2017-07-23 DIAGNOSIS — J449 Chronic obstructive pulmonary disease, unspecified: Secondary | ICD-10-CM

## 2017-07-23 DIAGNOSIS — R5381 Other malaise: Secondary | ICD-10-CM

## 2017-07-23 DIAGNOSIS — I739 Peripheral vascular disease, unspecified: Secondary | ICD-10-CM

## 2017-07-23 DIAGNOSIS — M1990 Unspecified osteoarthritis, unspecified site: Secondary | ICD-10-CM

## 2017-07-23 DIAGNOSIS — C641 Malignant neoplasm of right kidney, except renal pelvis: Secondary | ICD-10-CM | POA: Diagnosis not present

## 2017-07-23 DIAGNOSIS — E46 Unspecified protein-calorie malnutrition: Secondary | ICD-10-CM

## 2017-07-23 DIAGNOSIS — C7931 Secondary malignant neoplasm of brain: Secondary | ICD-10-CM | POA: Diagnosis not present

## 2017-07-23 DIAGNOSIS — F17210 Nicotine dependence, cigarettes, uncomplicated: Secondary | ICD-10-CM

## 2017-07-23 DIAGNOSIS — Z79899 Other long term (current) drug therapy: Secondary | ICD-10-CM

## 2017-07-23 LAB — CBC WITH DIFFERENTIAL/PLATELET
BASOS PCT: 1 %
Basophils Absolute: 0.1 10*3/uL (ref 0–0.1)
EOS ABS: 0.3 10*3/uL (ref 0–0.7)
EOS PCT: 4 %
HCT: 41 % (ref 40.0–52.0)
Hemoglobin: 13.6 g/dL (ref 13.0–18.0)
LYMPHS ABS: 1.9 10*3/uL (ref 1.0–3.6)
Lymphocytes Relative: 23 %
MCH: 30.3 pg (ref 26.0–34.0)
MCHC: 33.2 g/dL (ref 32.0–36.0)
MCV: 91.2 fL (ref 80.0–100.0)
MONOS PCT: 6 %
Monocytes Absolute: 0.5 10*3/uL (ref 0.2–1.0)
NEUTROS PCT: 66 %
Neutro Abs: 5.4 10*3/uL (ref 1.4–6.5)
Platelets: 295 10*3/uL (ref 150–440)
RBC: 4.5 MIL/uL (ref 4.40–5.90)
RDW: 16.7 % — ABNORMAL HIGH (ref 11.5–14.5)
WBC: 8.3 10*3/uL (ref 3.8–10.6)

## 2017-07-23 LAB — COMPREHENSIVE METABOLIC PANEL
ALK PHOS: 77 U/L (ref 38–126)
ALT: 20 U/L (ref 17–63)
AST: 23 U/L (ref 15–41)
Albumin: 4.1 g/dL (ref 3.5–5.0)
Anion gap: 9 (ref 5–15)
BUN: 27 mg/dL — AB (ref 6–20)
CALCIUM: 9.1 mg/dL (ref 8.9–10.3)
CHLORIDE: 103 mmol/L (ref 101–111)
CO2: 25 mmol/L (ref 22–32)
CREATININE: 0.95 mg/dL (ref 0.61–1.24)
GFR calc non Af Amer: >60 mL/min (ref >60)
Glucose, Bld: 78 mg/dL (ref 65–99)
Potassium: 4.6 mmol/L (ref 3.5–5.1)
SODIUM: 137 mmol/L (ref 135–145)
Total Bilirubin: 0.4 mg/dL (ref 0.3–1.2)
Total Protein: 7.9 g/dL (ref 6.5–8.1)

## 2017-07-23 NOTE — Assessment & Plan Note (Addendum)
#  Clear cell kidney cancer; stage IV-clinically stable; on Cabo 40mg /day [may 14th 2019].  #Continue current therapy tolerating well except for fatigue.  Discussed that we will get a repeat imaging in approximately 2 to 3 months since starting treatment  #History of weight loss secondary to brain surgery; status post PEG tube; today patient's weight is stable at 140 pounds; recently evaluated by his nutrition.  Recommend keeping the PEG tube for at least 2 more weeks/to be reevaluated by nutrition; and if weight stable then PEG tube could be explanted.  #Headaches; acute worsening-given history of brain metastases; recommend reevaluation with stat MRI.  Defer to [facility] neurosurgery regarding follow-up with [facility] neurosurgery as per patient preference.  # COPD/smoking- Continues Advair. Unfortunately, continues to smoke.   # Scleroderma; right foot/toe ulceration-secondary to scleroderma.  Currently stable.  Currently on Norvasc.  # follow up in 2 weeks/ [facility];Jolie;  follow up in me on July 1st 2019/labs-cbc/cmp. Care program referral.

## 2017-07-23 NOTE — Progress Notes (Signed)
[facility] OFFICE PROGRESS NOTE  Patient Care Team: Leone Haven, MD as PCP - General (Family Medicine)  Cancer Staging No matching staging information was found for the patient.   Oncology History   # JULY 2017 METASTATIC RCC- GOOD RISK [s/p LUL lung section;Dr.Oaks]; RLL- nodule ~43m  # June 2017- ? Frontal lesion on CT [cannot have MRI]  # RIGHT KIDNEY CANCER [incidental 2012; diverticulitis] pT3a (4.3x4.3x 3.2cm) [[facility] clear cell G-2; Neg margins; May 2012 ]  # July 20th- PAZOPANIB 4 pills/day; Discontinued sec to Elevated LFTs.   # OCT 2017- Start Sunitinib 2w-On; 1 w-OFF; CT NOV 13th- RLL- Improved; no new disease. MARCH 7th CT- CR. SUTENT-HOLD [since July 2018-sec to fatigue/poor tol]  # march 2019- Brain met [s/p resection/ s/p VP shunt; Dr.Jaikumar]; Bx of liver lesion- RCC [[facility]]  # May 12th 2019- Cabo 40 mg/day  # ? Scleroderma [Almodipine; Dr.Kernodle]- CONTRAINDICATION to IMMUNOTHERAPY.  -----------------------------------    DIAGNOSIS: '[ ]'  RCC  STAGE:  IV       ;GOALS: pallaitive  CURRENT/MOST RECENT THERAPY '[ ]'  Cabo 40 mg [may 14th]      Primary cancer of right kidney with metastasis from kidney to other site ([facility]      INTERVAL HISTORY:  Thomas [id number].[id number].  male pleasant patient above history of renal cell carcinoma currently on cabo-started May 14 is here for follow-up.  Patient complains of worsening fatigue.  Chronic shortness of breath especially exertion.  As per the wife patient is having worsening headaches.  Is more memory issues; forgetfulness.   Review of Systems  Constitutional: Positive for malaise/fatigue. Negative for chills, diaphoresis and fever.  HENT: Negative for nosebleeds and sore throat.   Eyes: Negative for double vision.  Respiratory: Positive for shortness of breath. Negative for cough, hemoptysis, sputum production and wheezing.   Cardiovascular: Negative for chest pain, palpitations, orthopnea and  leg swelling.  Gastrointestinal: Negative for abdominal pain, blood in stool, constipation, diarrhea, heartburn, melena, nausea and vomiting.  Genitourinary: Negative for dysuria, frequency and urgency.  Musculoskeletal: Positive for back pain and joint pain.  Skin: Negative.  Negative for itching and rash.  Neurological: Positive for dizziness and headaches. Negative for tingling, focal weakness and weakness.  Endo/Heme/Allergies: Does not bruise/bleed easily.  Psychiatric/Behavioral: Positive for memory loss. Negative for depression. The patient is not nervous/anxious and does not have insomnia.       PAST MEDICAL HISTORY :  Past Medical History:  Diagnosis Date  . Allergic rhinitis   . Anxiety   . Arthritis   . Asthma   . Chronic bronchitis ([place]   . Colitis    Had blood in his stool with this and treated in the hospital  . Colitis cystica profunda   . Colon polyps   . Complication of anesthesia    when waking up get claustophobic with mask, anxiety, panic  . Depression   . Diverticulitis   . ED (erectile dysfunction)   . Genital herpes   . Hypertension   . Kidney disease   . Lytic lesion of bone on x-ray   . Medical history non-contributory   . Occipital lymphadenopathy   . Peripheral vascular disease ([place]   . Renal cancer ([place]   . Shortness of breath dyspnea     PAST SURGICAL HISTORY :   Past Surgical History:  Procedure Laterality Date  . APPENDECTOMY  1966  . COLON SURGERY    . KIDNEY SURGERY Right 2012  Nephrectomy  . PARTIAL COLECTOMY  2012   For perforated colon related to diverticulitis- Dr. Marina Gravel  . VASECTOMY  2000  . VIDEO ASSISTED THORACOSCOPY (VATS)/THOROCOTOMY Left 08/13/2015   Procedure: LEFT THOROCOTOMY WITH LEFT UPPER LOBECTOMY, PREOP BRONCHOSCOPY;  Surgeon: Nestor Lewandowsky, MD;  Location: [facility] ORS;  Service: General;  Laterality: Left;    FAMILY HISTORY :   Family History  Problem Relation Age of Onset  . Arthritis/Rheumatoid Unknown         Parent  . Heart disease Unknown        Grandparent  . Alcoholism Unknown        Other relative  . Arthritis Mother        Rhematoid    SOCIAL HISTORY:   Social History   Tobacco Use  . Smoking status: Current Every Day Smoker    Packs/day: 0.25    Years: 30.00    Pack years: 7.50    Types: Cigarettes  . Smokeless tobacco: Never Used  Substance Use Topics  . Alcohol use: Yes    Alcohol/week: 3.0 oz    Types: 5 Cans of beer per week    Comment: 5 beers a night   . Drug use: No    ALLERGIES:  has No Known Allergies.  MEDICATIONS:  Current Outpatient Medications  Medication Sig Dispense Refill  . acetaminophen (TYLENOL) 500 MG tablet Take 500 mg by mouth every 6 (six) hours as needed for mild pain.    Marland Kitchen amLODipine (NORVASC) 2.5 MG tablet Take 1 tablet (2.5 mg total) by mouth daily. (Patient taking differently: Take 2.5 mg by mouth 2 (two) times daily. ) 60 tablet 3  . cabozantinib (CABOMETYX) 40 MG tablet Take 1 tablet (40 mg total) by mouth daily. Take on an empty stomach, 1 hour before or 2 hours after meals. 30 tablet 3  . meloxicam (MOBIC) 7.5 MG tablet Take 1 tablet by mouth daily.    . traMADol (ULTRAM) 50 MG tablet Take 1 tablet (50 mg total) by mouth every 6 (six) hours as needed for moderate pain or severe pain. 30 tablet 0  . lidocaine (LIDODERM) 5 % Place 1 patch onto the skin daily. Remove & Discard patch within 12 hours or as directed by MD (Patient not taking: Reported on 07/23/2017) 15 patch 0   No current facility-administered medications for this visit.     PHYSICAL EXAMINATION: ECOG PERFORMANCE STATUS: 1 - Symptomatic but completely ambulatory  BP 105/69 (BP Location: Left Arm, Patient Position: Sitting)   Pulse (!) 54   Temp 97.8 F (36.6 C) (Tympanic)   Resp 16   Wt 141 lb 9.6 oz (64.2 kg)   BMI 18.68 kg/m   Filed Weights   07/23/17 1057  Weight: 141 lb 9.6 oz (64.2 kg)    GENERAL: Well-nourished well-developed; Alert, no distress and  comfortable.  Accompanied by his wife.  EYES: no pallor or icterus OROPHARYNX: no thrush or ulceration; NECK: supple; no lymph nodes felt. LYMPH:  no palpable lymphadenopathy in the axillary or inguinal regions LUNGS: Decreased breath sounds auscultation bilaterally. No wheeze or crackles HEART/CVS: regular rate & rhythm and no murmurs; No lower extremity edema ABDOMEN:abdomen soft, non-tender and normal bowel sounds. No hepatomegaly or splenomegaly.  Musculoskeletal:no cyanosis of digits and no clubbing  PSYCH: alert & oriented x 3 with fluent speech NEURO: no focal motor/sensory deficits SKIN:  no rashes or significant lesions    LABORATORY DATA:  I have reviewed the data as listed  Component Value Date/Time   NA 137 07/23/2017 1032   NA 137 12/08/2012 0529   K 4.6 07/23/2017 1032   K 4.0 12/08/2012 0529   CL 103 07/23/2017 1032   CL 106 12/08/2012 0529   CO2 25 07/23/2017 1032   CO2 24 12/08/2012 0529   GLUCOSE 78 07/23/2017 1032   GLUCOSE 76 12/08/2012 0529   BUN 27 (H) 07/23/2017 1032   BUN 6 (L) 12/08/2012 0529   CREATININE 0.95 07/23/2017 1032   CREATININE 1.11 12/08/2012 0529   CALCIUM 9.1 07/23/2017 1032   CALCIUM 8.1 (L) 12/08/2012 0529   PROT 7.9 07/23/2017 1032   PROT 6.9 12/06/2012 0901   ALBUMIN 4.1 07/23/2017 1032   ALBUMIN 3.5 12/06/2012 0901   AST 23 07/23/2017 1032   AST 13 (L) 12/06/2012 0901   ALT 20 07/23/2017 1032   ALT 15 12/06/2012 0901   ALKPHOS 77 07/23/2017 1032   ALKPHOS 73 12/06/2012 0901   BILITOT 0.4 07/23/2017 1032   BILITOT 0.8 12/06/2012 0901   GFRNONAA >60 07/23/2017 1032   GFRNONAA >60 12/08/2012 0529   GFRAA >60 07/23/2017 1032   GFRAA >60 12/08/2012 0529    No results found for: SPEP, UPEP  Lab Results  Component Value Date   WBC 8.3 07/23/2017   NEUTROABS 5.4 07/23/2017   HGB 13.6 07/23/2017   HCT 41.0 07/23/2017   MCV 91.2 07/23/2017   PLT 295 07/23/2017      Chemistry      Component Value Date/Time   NA 137  07/23/2017 1032   NA 137 12/08/2012 0529   K 4.6 07/23/2017 1032   K 4.0 12/08/2012 0529   CL 103 07/23/2017 1032   CL 106 12/08/2012 0529   CO2 25 07/23/2017 1032   CO2 24 12/08/2012 0529   BUN 27 (H) 07/23/2017 1032   BUN 6 (L) 12/08/2012 0529   CREATININE 0.95 07/23/2017 1032   CREATININE 1.11 12/08/2012 0529      Component Value Date/Time   CALCIUM 9.1 07/23/2017 1032   CALCIUM 8.1 (L) 12/08/2012 0529   ALKPHOS 77 07/23/2017 1032   ALKPHOS 73 12/06/2012 0901   AST 23 07/23/2017 1032   AST 13 (L) 12/06/2012 0901   ALT 20 07/23/2017 1032   ALT 15 12/06/2012 0901   BILITOT 0.4 07/23/2017 1032   BILITOT 0.8 12/06/2012 0901       RADIOGRAPHIC STUDIES: I have personally reviewed the radiological images as listed and agreed with the findings in the report. No results found.   ASSESSMENT & PLAN:  Primary cancer of right kidney with metastasis from kidney to other site [facility]) #Clear cell kidney cancer; stage IV-clinically stable; on [place] 79m/day [may 14th 2019].  #Continue current therapy tolerating well except for fatigue.  Discussed that we will get a repeat imaging in approximately 2 to 3 months since starting treatment  #History of weight loss secondary to brain surgery; status post PEG tube; today patient's weight is stable at 140 pounds; recently evaluated by his nutrition.  Recommend keeping the PEG tube for at least 2 more weeks/to be reevaluated by nutrition; and if weight stable then PEG tube could be explanted.  #Headaches; acute worsening-given history of brain metastases; recommend reevaluation with stat MRI.  [facility] as per patient preference.  # COPD/smoking- Continues Advair. Unfortunately, continues to smoke.   # Scleroderma; right foot/toe ulceration-secondary to scleroderma.  Currently stable.  Currently on Norvasc.  # follow up in  2 weeks/ [facility];Jolie;  follow up in me on July 1st  2019/labs-cbc/cmp. Care program referral.        Orders Placed This Encounter  Procedures  . MR Brain W Wo Contrast    Patient can leave after procedure per provider    Standing Status:   Future    Standing Expiration Date:   07/23/2018    Order Specific Question:   If indicated for the ordered procedure, I authorize the administration of contrast media per Radiology protocol    Answer:   Yes    Order Specific Question:   What is the patient's sedation requirement?    Answer:   No Sedation    Order Specific Question:   Does the patient have a pacemaker or implanted devices?    Answer:   No    Order Specific Question:   Use SRS Protocol?    Answer:   Yes    Order Specific Question:   Call Results- Best Contact Number?    Answer:   [phone]    Order Specific Question:   Radiology Contrast Protocol - do NOT remove file path    Answer:   [email or web address]    Order Specific Question:   Preferred imaging location?    Answer:   [facility] (table limit-350lbs)  . CBC with Differential    Standing Status:   Future    Standing Expiration Date:   07/23/2018  . Basic metabolic panel    Standing Status:   Future    Standing Expiration Date:   07/23/2018  . Comprehensive metabolic panel    Standing Status:   Future    Standing Expiration Date:   07/23/2018  . CBC with Differential    Standing Status:   Future    Standing Expiration Date:   07/23/2018   All questions were answered. The patient knows to call the clinic with any problems, questions or concerns.      Cammie Sickle, MD 07/23/2017 5:17 PM

## 2017-07-23 NOTE — Progress Notes (Signed)
Nutrition  Patient did not want to wait on RD following MD appointment today.  Patient rescheduled for nutrition appointment on 6/13.    Mose Colaizzi B. Zenia Resides, [place], [place] Registered Dietitian [phone] (pager)

## 2017-07-23 NOTE — Telephone Encounter (Signed)
Please make a referral to [facility] care program thank you

## 2017-07-24 ENCOUNTER — Ambulatory Visit
Admission: RE | Admit: 2017-07-24 | Discharge: 2017-07-24 | Disposition: A | Discharge status: Home / Self Care | Payer: BLUE CROSS/BLUE SHIELD | Source: Ambulatory Visit | Attending: Internal Medicine | Admitting: Internal Medicine

## 2017-07-24 DIAGNOSIS — Z9889 Other specified postprocedural states: Secondary | ICD-10-CM | POA: Diagnosis not present

## 2017-07-24 DIAGNOSIS — I618 Other nontraumatic intracerebral hemorrhage: Secondary | ICD-10-CM | POA: Insufficient documentation

## 2017-07-24 DIAGNOSIS — R29818 Other symptoms and signs involving the nervous system: Secondary | ICD-10-CM

## 2017-07-24 MED ORDER — GADOBENATE DIMEGLUMINE 529 MG/ML IV SOLN
15.0000 mL | Freq: Once | INTRAVENOUS | Status: AC | PRN
  Administered 2017-07-24: 13 mL via INTRAVENOUS

## 2017-07-24 NOTE — Addendum Note (Signed)
Addended by: Sabino Gasser on: 07/24/2017 09:14 AM   Modules accepted: Orders

## 2017-07-24 NOTE — Telephone Encounter (Signed)
Ref to [facility] program per md order

## 2017-07-30 ENCOUNTER — Telehealth: Payer: Self-pay | Admitting: *Deleted

## 2017-07-30 NOTE — Telephone Encounter (Signed)
Please inform pt that MRI negative for cancer; will forward the disc to Dr.Jaikumar for their review. Thx!!

## 2017-07-30 NOTE — Telephone Encounter (Signed)
Called patient left  the following message from Dr. Burlene Arnt:   Please inform pt that MRI negative for cancer; will forward the disc to Dr.Jaikumar for their review.

## 2017-07-30 NOTE — Telephone Encounter (Signed)
Duplicate-opened in error. 

## 2017-08-04 NOTE — Telephone Encounter (Signed)
I spoke with Butch Penny in radiology and she states she has powershared the brain MRI images to [facility]. Thanks!

## 2017-08-04 NOTE — Telephone Encounter (Signed)
Brooke call X [phone] - See if radiology can enter this in powershare to [facility] Dr. Talbot Grumbling if not we will need a CD of last brain mri to send to [facility].

## 2017-08-06 ENCOUNTER — Inpatient Hospital Stay: Payer: BLUE CROSS/BLUE SHIELD | Attending: Nurse Practitioner

## 2017-08-06 ENCOUNTER — Inpatient Hospital Stay (HOSPITAL_BASED_OUTPATIENT_CLINIC_OR_DEPARTMENT_OTHER): Payer: BLUE CROSS/BLUE SHIELD | Admitting: Nurse Practitioner

## 2017-08-06 ENCOUNTER — Inpatient Hospital Stay: Payer: BLUE CROSS/BLUE SHIELD

## 2017-08-06 ENCOUNTER — Encounter: Payer: Self-pay | Admitting: Nurse Practitioner

## 2017-08-06 VITALS — BP 128/78 | HR 53 | Temp 97.9°F | Resp 16 | Wt 143.0 lb

## 2017-08-06 DIAGNOSIS — Z9221 Personal history of antineoplastic chemotherapy: Secondary | ICD-10-CM

## 2017-08-06 DIAGNOSIS — C787 Secondary malignant neoplasm of liver and intrahepatic bile duct: Secondary | ICD-10-CM | POA: Insufficient documentation

## 2017-08-06 DIAGNOSIS — Z931 Gastrostomy status: Secondary | ICD-10-CM | POA: Insufficient documentation

## 2017-08-06 DIAGNOSIS — R5383 Other fatigue: Secondary | ICD-10-CM | POA: Insufficient documentation

## 2017-08-06 DIAGNOSIS — F17210 Nicotine dependence, cigarettes, uncomplicated: Secondary | ICD-10-CM

## 2017-08-06 DIAGNOSIS — E46 Unspecified protein-calorie malnutrition: Secondary | ICD-10-CM

## 2017-08-06 DIAGNOSIS — R413 Other amnesia: Secondary | ICD-10-CM | POA: Insufficient documentation

## 2017-08-06 DIAGNOSIS — Z79899 Other long term (current) drug therapy: Secondary | ICD-10-CM

## 2017-08-06 DIAGNOSIS — C7802 Secondary malignant neoplasm of left lung: Secondary | ICD-10-CM | POA: Diagnosis not present

## 2017-08-06 DIAGNOSIS — I10 Essential (primary) hypertension: Secondary | ICD-10-CM | POA: Diagnosis not present

## 2017-08-06 DIAGNOSIS — M1990 Unspecified osteoarthritis, unspecified site: Secondary | ICD-10-CM

## 2017-08-06 DIAGNOSIS — R0609 Other forms of dyspnea: Secondary | ICD-10-CM | POA: Insufficient documentation

## 2017-08-06 DIAGNOSIS — C641 Malignant neoplasm of right kidney, except renal pelvis: Secondary | ICD-10-CM | POA: Diagnosis not present

## 2017-08-06 DIAGNOSIS — F419 Anxiety disorder, unspecified: Secondary | ICD-10-CM | POA: Insufficient documentation

## 2017-08-06 DIAGNOSIS — I739 Peripheral vascular disease, unspecified: Secondary | ICD-10-CM | POA: Insufficient documentation

## 2017-08-06 DIAGNOSIS — M549 Dorsalgia, unspecified: Secondary | ICD-10-CM

## 2017-08-06 DIAGNOSIS — R5381 Other malaise: Secondary | ICD-10-CM

## 2017-08-06 DIAGNOSIS — C7931 Secondary malignant neoplasm of brain: Secondary | ICD-10-CM | POA: Diagnosis not present

## 2017-08-06 DIAGNOSIS — J449 Chronic obstructive pulmonary disease, unspecified: Secondary | ICD-10-CM | POA: Diagnosis not present

## 2017-08-06 LAB — CBC WITH DIFFERENTIAL/PLATELET
BASOS ABS: 0 10*3/uL (ref 0–0.1)
BASOS PCT: 1 %
EOS PCT: 4 %
Eosinophils Absolute: 0.3 10*3/uL (ref 0–0.7)
HEMATOCRIT: 43.2 % (ref 40.0–52.0)
Hemoglobin: 14.5 g/dL (ref 13.0–18.0)
LYMPHS PCT: 29 %
Lymphs Abs: 2.2 10*3/uL (ref 1.0–3.6)
MCH: 30.4 pg (ref 26.0–34.0)
MCHC: 33.6 g/dL (ref 32.0–36.0)
MCV: 90.7 fL (ref 80.0–100.0)
MONO ABS: 0.5 10*3/uL (ref 0.2–1.0)
Monocytes Relative: 6 %
NEUTROS ABS: 4.6 10*3/uL (ref 1.4–6.5)
Neutrophils Relative %: 60 %
PLATELETS: 194 10*3/uL (ref 150–440)
RBC: 4.76 MIL/uL (ref 4.40–5.90)
RDW: 17.1 % — AB (ref 11.5–14.5)
WBC: 7.7 10*3/uL (ref 3.8–10.6)

## 2017-08-06 LAB — BASIC METABOLIC PANEL
ANION GAP: 10 (ref 5–15)
BUN: 22 mg/dL — ABNORMAL HIGH (ref 6–20)
CALCIUM: 8.9 mg/dL (ref 8.9–10.3)
CO2: 24 mmol/L (ref 22–32)
Chloride: 104 mmol/L (ref 101–111)
Creatinine, Ser: 0.98 mg/dL (ref 0.61–1.24)
GFR calc Af Amer: >60 mL/min (ref >60)
Glucose, Bld: 80 mg/dL (ref 65–99)
POTASSIUM: 4.8 mmol/L (ref 3.5–5.1)
Sodium: 138 mmol/L (ref 135–145)

## 2017-08-06 NOTE — Progress Notes (Signed)
Nutrition Follow-up:  Patient with metastatic renal cell carcinoma.  Patient with brain lesion s/p cranial surgery at [facility].  PEG placed on 2/22 at [facility].  Patient currently taking cabo 58m/day  Met with patient and wife following visit with NP.  Wife reports that patient has been eating well orally, not drinking supplement shakes daily.  Reports that they have used tube 2 nights giving only 1 carton of tube feeding.  Wife reports that she was gone during the day and did not know what he ate so she gave him 1 carton of tube feeding on 2 different nights.  Otherwise has just been flushing tube with 650mof water daily.  Reports that he has been eating eggs, cheese english muffin, roast pork red potatoes, 2 pieces of toast yesterday.  Could remember what else he ate.  Drinking powerade juice  And whole milk.  Wife reports that tube is bothering him.  Patient reports he wants tube out.    Wife reports no nausea, vomiting.   Medications: reviewed  Labs: reviewed  Anthropometrics:   Weight has increased to 143 lb today from 142 lb 6.4 oz on 5/15  Weight prior to surgery 150 lb on 04/2017 IBW 184 lb  RD visit in 02/11/2016   NUTRITION DIAGNOSIS: Inadequate oral intake improved with weight gain   INTERVENTION:  Ultimately decision regarding PEG removal rest with patient.  Discussed with patient and wife that once tube is removed that she will not be able to give supplemental feeding like she has done on 2 nights since last met to improve nutrition. Wife and patient verbalized understanding.  RD continues to be concerned that patient is under IBW of 184 lb and has not regained weight to pre surgery weight.  RD would not recommend PEG tube being removed but once again will be patient's decision.   Encouraged patient to continue to drink oral nutrition supplements 2-3 daily.  1st case of ensure plus given today.  Encouraged high calorie, high protein foods     MONITORING, EVALUATION, GOAL: weight  trends, intake   NEXT VISIT:  As needed  Byard Carranza B. AlZenia ResidesRDLyonsLDTonsinaegistered Dietitian [phone])

## 2017-08-06 NOTE — Progress Notes (Signed)
[facility] OFFICE PROGRESS NOTE  Patient Care Team: Leone Haven, MD as PCP - General (Family Medicine) Cammie Sickle, MD as Medical Oncologist (Medical Oncology)  Cancer Staging No matching staging information was found for the patient.   Oncology History   # JULY 2017 METASTATIC RCC- GOOD RISK [s/p LUL lung section;Dr.Oaks]; RLL- nodule ~74m  # June 2017- ? Frontal lesion on CT [cannot have MRI]  # RIGHT KIDNEY CANCER [incidental 2012; diverticulitis] pT3a (4.3x4.3x 3.2cm) [[facility] clear cell G-2; Neg margins; May 2012 ]  # July 20th- PAZOPANIB 4 pills/day; Discontinued sec to Elevated LFTs.   # OCT 2017- Start Sunitinib 2w-On; 1 w-OFF; CT NOV 13th- RLL- Improved; no new disease. MARCH 7th CT- CR. SUTENT-HOLD [since July 2018-sec to fatigue/poor tol]  # march 2019- Brain met [s/p resection/ s/p VP shunt; Dr.Jaikumar]; Bx of liver lesion- RCC [[facility]]  # May 12th 2019- Cabo 40 mg/day  # ? Scleroderma [Almodipine; Dr.Kernodle]- CONTRAINDICATION to IMMUNOTHERAPY.  -----------------------------------    DIAGNOSIS: '[ ]'  RCC  STAGE:  IV       ;GOALS: pallaitive  CURRENT/MOST RECENT THERAPY '[ ]'  Cabo 40 mg [may 14th]      Primary cancer of right kidney with metastasis from kidney to other site ([facility]     INTERVAL HISTORY:   Thomas PIEPER89y.o.  male pleasant patient above history of renal cell carcinoma currently on cabo, returns to clinic for follow-up.   He has been on CVietnamsince May 14th and other than fatigue has tolerated it well. He continues to have chronic shortness of breath with exertion. He continues to have fatigue but this is stable. His oral consumption has improved. He is requesting to have tube removed as he feels it is painful at opening. He has used supplemental nutrition 1-2 times over past week. Recent MRI of brain was negative for cancer. Memory impairment is stable and no worse. He denies headaches.    Review of Systems   Constitutional: Positive for malaise/fatigue. Negative for chills, diaphoresis, fever and weight loss.  HENT: Negative for nosebleeds and sore throat.   Eyes: Negative for double vision.  Respiratory: Positive for shortness of breath (with exertion). Negative for cough, hemoptysis, sputum production and wheezing.   Cardiovascular: Negative for chest pain, palpitations, orthopnea and leg swelling.  Gastrointestinal: Negative for abdominal pain, blood in stool, constipation, diarrhea, heartburn, melena, nausea and vomiting.  Genitourinary: Negative for dysuria, frequency and urgency.  Musculoskeletal: Positive for back pain and joint pain.  Skin: Negative.  Negative for itching and rash.  Neurological: Negative for dizziness, tingling, focal weakness, weakness and headaches.  Endo/Heme/Allergies: Does not bruise/bleed easily.  Psychiatric/Behavioral: Positive for memory loss (unchanged; no worse). Negative for depression. The patient is not nervous/anxious and does not have insomnia.       PAST MEDICAL HISTORY :  Past Medical History:  Diagnosis Date  . Allergic rhinitis   . Anxiety   . Arthritis   . Asthma   . [place]   . Colitis    Had blood in his stool with this and treated in the hospital  . Colitis cystica profunda   . Colon polyps   . Complication of anesthesia    when waking up get claustophobic with mask, anxiety, panic  . Depression   . Diverticulitis   . ED (erectile dysfunction)   . Genital herpes   . Hypertension   . Kidney disease   . Lytic lesion of  bone on x-ray   . Medical history non-contributory   . Occipital lymphadenopathy   . Peripheral vascular disease ([place])   . Renal cancer ([place])   . Shortness of breath dyspnea     PAST SURGICAL HISTORY :   Past Surgical History:  Procedure Laterality Date  . APPENDECTOMY  1966  . COLON SURGERY    . KIDNEY SURGERY Right 2012   Nephrectomy  . PARTIAL COLECTOMY  2012   For perforated colon  related to diverticulitis- Dr. Marina Gravel  . VASECTOMY  2000  . VIDEO ASSISTED THORACOSCOPY (VATS)/THOROCOTOMY Left 08/13/2015   Procedure: LEFT THOROCOTOMY WITH LEFT UPPER LOBECTOMY, PREOP BRONCHOSCOPY;  Surgeon: Nestor Lewandowsky, MD;  Location: [facility] ORS;  Service: General;  Laterality: Left;    FAMILY HISTORY :   Family History  Problem Relation Age of Onset  . Arthritis/Rheumatoid Unknown        Parent  . Heart disease Unknown        Grandparent  . Alcoholism Unknown        Other relative  . Arthritis Mother        Rhematoid    SOCIAL HISTORY:   Social History   Tobacco Use  . Smoking status: Current Every Day Smoker    Packs/day: 0.25    Years: 30.00    Pack years: 7.50    Types: Cigarettes  . Smokeless tobacco: Never Used  Substance Use Topics  . Alcohol use: Yes    Alcohol/week: 3.0 oz    Types: 5 Cans of beer per week    Comment: 5 beers a night   . Drug use: No    ALLERGIES:  has No Known Allergies.  MEDICATIONS:  Current Outpatient Medications  Medication Sig Dispense Refill  . acetaminophen (TYLENOL) 500 MG tablet Take 500 mg by mouth every 6 (six) hours as needed for mild pain.    Marland Kitchen amLODipine (NORVASC) 2.5 MG tablet Take 1 tablet (2.5 mg total) by mouth daily. (Patient taking differently: Take 2.5 mg by mouth 2 (two) times daily. ) 60 tablet 3  . cabozantinib (CABOMETYX) 40 MG tablet Take 1 tablet (40 mg total) by mouth daily. Take on an empty stomach, 1 hour before or 2 hours after meals. 30 tablet 3  . meloxicam (MOBIC) 7.5 MG tablet Take 1 tablet by mouth daily.    . traMADol (ULTRAM) 50 MG tablet Take 1 tablet (50 mg total) by mouth every 6 (six) hours as needed for moderate pain or severe pain. 30 tablet 0  . lidocaine (LIDODERM) 5 % Place 1 patch onto the skin daily. Remove & Discard patch within 12 hours or as directed by MD (Patient not taking: Reported on 07/23/2017) 15 patch 0   No current facility-administered medications for this visit.     PHYSICAL  EXAMINATION: ECOG PERFORMANCE STATUS: 1 - Symptomatic but completely ambulatory  BP 128/78 (Patient Position: Sitting)   Pulse (!) 53   Temp 97.9 F (36.6 C) (Tympanic)   Resp 16   Wt 143 lb (64.9 kg)   BMI 18.87 kg/m   Filed Weights   08/06/17 1349  Weight: 143 lb (64.9 kg)   GENERAL: Well-nourished well-developed; Alert, no distress and comfortable. Accompanied by wife.  EYES: no pallor or icterus OROPHARYNX: no thrush or ulceration NECK: supple; no lymph nodes felt LYMPH: no palpable lymphadenopathy in the axillary or inguinal regions LUNGS: Decreased breath sounds auscultation bilaterally. No wheeze or crackles HEART/CVS: regular rate & rhythm and no murmurs; No lower  extremity edema ABDOMEN: abdomen soft, non-tender and normal bowel sounds. No hepatomegaly or splenomegaly. PEG tube in place. Mild irritation at site d/t moisture.  Musculoskeletal: no cyanosis of digits and no clubbing  PSYCH: alert & oriented x 3 with fluent speech NEURO: no focal motor/sensory deficits SKIN: no rashes or significant lesions   LABORATORY DATA:  I have reviewed the data as listed    Component Value Date/Time   NA 138 08/06/2017 1314   NA 137 12/08/2012 0529   K 4.8 08/06/2017 1314   K 4.0 12/08/2012 0529   CL 104 08/06/2017 1314   CL 106 12/08/2012 0529   CO2 24 08/06/2017 1314   CO2 24 12/08/2012 0529   GLUCOSE 80 08/06/2017 1314   GLUCOSE 76 12/08/2012 0529   BUN 22 (H) 08/06/2017 1314   BUN 6 (L) 12/08/2012 0529   CREATININE 0.98 08/06/2017 1314   CREATININE 1.11 12/08/2012 0529   CALCIUM 8.9 08/06/2017 1314   CALCIUM 8.1 (L) 12/08/2012 0529   PROT 7.9 07/23/2017 1032   PROT 6.9 12/06/2012 0901   ALBUMIN 4.1 07/23/2017 1032   ALBUMIN 3.5 12/06/2012 0901   AST 23 07/23/2017 1032   AST 13 (L) 12/06/2012 0901   ALT 20 07/23/2017 1032   ALT 15 12/06/2012 0901   ALKPHOS 77 07/23/2017 1032   ALKPHOS 73 12/06/2012 0901   BILITOT 0.4 07/23/2017 1032   BILITOT 0.8 12/06/2012  0901   GFRNONAA >60 08/06/2017 1314   GFRNONAA >60 12/08/2012 0529   GFRAA >60 08/06/2017 1314   GFRAA >60 12/08/2012 0529    No results found for: SPEP, UPEP  Lab Results  Component Value Date   WBC 7.7 08/06/2017   NEUTROABS 4.6 08/06/2017   HGB 14.5 08/06/2017   HCT 43.2 08/06/2017   MCV 90.7 08/06/2017   PLT 194 08/06/2017      Chemistry      Component Value Date/Time   NA 138 08/06/2017 1314   NA 137 12/08/2012 0529   K 4.8 08/06/2017 1314   K 4.0 12/08/2012 0529   CL 104 08/06/2017 1314   CL 106 12/08/2012 0529   CO2 24 08/06/2017 1314   CO2 24 12/08/2012 0529   BUN 22 (H) 08/06/2017 1314   BUN 6 (L) 12/08/2012 0529   CREATININE 0.98 08/06/2017 1314   CREATININE 1.11 12/08/2012 0529      Component Value Date/Time   CALCIUM 8.9 08/06/2017 1314   CALCIUM 8.1 (L) 12/08/2012 0529   ALKPHOS 77 07/23/2017 1032   ALKPHOS 73 12/06/2012 0901   AST 23 07/23/2017 1032   AST 13 (L) 12/06/2012 0901   ALT 20 07/23/2017 1032   ALT 15 12/06/2012 0901   BILITOT 0.4 07/23/2017 1032   BILITOT 0.8 12/06/2012 0901       RADIOGRAPHIC STUDIES: I have personally reviewed the radiological images as listed and agreed with the findings in the report. No results found.   ASSESSMENT & PLAN:  1. Metastatic Renal Cell Carcinoma - good risk- s/p resection of LUL nodule, stage IV (RLL lung nodule). Sutent 08/2016 held sec to intolerance. 06/2017 CT shows bilateral progression of lung nodules; left rib mets/soft tissue lesions. MRI of abdomen 4-5 cm liver lesion s/p biopsy- positive fo RCC (at [facility]). Brain met- hydrocephalus s/p resection. On Cabometyx 51m daily given with palliative intent. Tolerating well other than fatigue (see below)  2. Malnutrition- Weight increased x 2 lb. Follow up with JJennet Maduro dietitian. Continued to encourage recommendations including protein and increased  calorie intake. Suspect decreased weight and poor oral intake contributory to fatigue.   3. PEG tube-  placed 2/22 at [facility]. supplemental nutrition via PEG. Patient requesting peg removed. Discussed benefits of peg access and my concerns for removal. Patient continues to wish to have removed. Advised patient to follow up with [facility] to discuss adjustment for comfort vs removal.   4. Tobacco Abuse & COPD- doe likely r/t underlying copd and deconditioning. Improved. Continued to counsel on benefits of stopping and cutting back on smoking. He denies being ready to quit smoking. Encouraged follow-up with care program and continue Advair.   rtc as scheduled on 08/24/17 for follow up   No orders of the defined types were placed in this encounter.  All questions were answered. The patient knows to call the clinic with any problems, questions or concerns.    Beckey Rutter, DNP, AGNP-C [place] at [facility] [phone] (work cell) [phone] (office) 08/07/17 4:12 PM

## 2017-08-07 ENCOUNTER — Telehealth: Payer: Self-pay | Admitting: *Deleted

## 2017-08-07 NOTE — Telephone Encounter (Signed)
Called patient's wife to discuss yesterday's appointment. She is questioning why peg wasn't removed in office yesterday. Advised her that they will need to follow up with surgery who placed tube for assessment and removal if patient wishes to proceed. Again advised her of my concerns with removal but that they may proceed if they so choose. She thanked for call and recommendation. They will discuss with surgeon before moving forward.

## 2017-08-07 NOTE — Telephone Encounter (Signed)
Wife called requesting that Thomas Le return her call regarding appointment yesterday and the plan moving forward. She apologizes that she had so many interruptions during the appointment. Return call to [phone]

## 2017-08-10 ENCOUNTER — Encounter (INDEPENDENT_AMBULATORY_CARE_PROVIDER_SITE_OTHER): Payer: Self-pay

## 2017-08-10 ENCOUNTER — Ambulatory Visit (INDEPENDENT_AMBULATORY_CARE_PROVIDER_SITE_OTHER): Payer: BLUE CROSS/BLUE SHIELD | Admitting: Vascular Surgery

## 2017-08-10 ENCOUNTER — Encounter (INDEPENDENT_AMBULATORY_CARE_PROVIDER_SITE_OTHER): Payer: Self-pay | Admitting: Vascular Surgery

## 2017-08-10 VITALS — BP 119/70 | HR 67 | Ht 73.0 in | Wt 143.0 lb

## 2017-08-10 DIAGNOSIS — Z720 Tobacco use: Secondary | ICD-10-CM

## 2017-08-10 DIAGNOSIS — L97512 Non-pressure chronic ulcer of other part of right foot with fat layer exposed: Secondary | ICD-10-CM

## 2017-08-10 NOTE — Progress Notes (Signed)
Subjective:    Patient ID: Thomas Le, male    DOB: [birth year], 62 y.o.   MRN: [id number] Chief Complaint  Patient presents with  . New Patient (Initial Visit)    decreased pedal pulses    Presents as a new patient referred by Dr. Birdie Sons for evaluation of decreased pedal pulses.  The patient has a past medical history of metastatic right renal cancer, s/p G-tube placement, recent diagnosis with scleroderma current tobacco abuse.  The patient presents today with progressively worsening right big toe pain.  The patient notes his discomfort worsens with ambulation.  The patient denies any rest pain.  The patient now has an ulcer forming at the tip of the right big toe.  Notes that it is "gray" and not healing.  During a recent physical exam patient notes that Dr. Birdie Sons was unable to palpate pedal pulses to the bilateral lower extremity.  The patient denies any left lower extremity symptoms.  Patient denies any fever, nausea or vomiting.  Review of Systems  Constitutional: Negative.   HENT: Negative.   Eyes: Negative.   Respiratory: Negative.   Cardiovascular: Negative.   Gastrointestinal: Negative.   Endocrine: Negative.   Genitourinary: Negative.   Musculoskeletal: Negative.   Skin: Positive for wound.  Allergic/Immunologic: Negative.   Neurological: Negative.   Hematological: Negative.   Psychiatric/Behavioral: Negative.       Objective:   Physical Exam  Constitutional: He is oriented to person, place, and time. He appears well-developed and well-nourished. No distress.  HENT:  Head: Normocephalic and atraumatic.  Right Ear: External ear normal.  Left Ear: External ear normal.  Eyes: Pupils are equal, round, and reactive to light. Conjunctivae and EOM are normal.  Neck: Normal range of motion.  Cardiovascular: Normal rate, regular rhythm, normal heart sounds and intact distal pulses.  Pulses:      Radial pulses are 2+ on the right side, and 2+ on the left side.    Hard to palpate pedal pulses to the bilateral lower extremity.  Right foot is warm to approximately mid foot then becomes cooler.  There is a 2 cm x 2 cm ulceration noted to the tip of the right toe.  No granulation tissue noted to the wound bed.  There is no drainage.  There is no necrotic tissue.  There is no cellulitis.  Pulmonary/Chest: Effort normal and breath sounds normal.  Abdominal:  G-tube in place.  Musculoskeletal: Normal range of motion. He exhibits no edema.  Neurological: He is alert and oriented to person, place, and time.  Skin: He is not diaphoretic.  Psychiatric: He has a normal mood and affect. His behavior is normal. Judgment and thought content normal.  Vitals reviewed.  BP 119/70 (BP Location: Right Arm, Patient Position: Sitting)   Pulse 67   Ht 6\' 1"  (1.854 m)   Wt 143 lb (64.9 kg)   BMI 18.87 kg/m   Past Medical History:  Diagnosis Date  . Allergic rhinitis   . Anxiety   . Arthritis   . Asthma   . Chronic bronchitis (HCC)   . Colitis    Had blood in his stool with this and treated in the hospital  . Colitis cystica profunda   . Colon polyps   . Complication of anesthesia    when waking up get claustophobic with mask, anxiety, panic  . Depression   . Diverticulitis   . ED (erectile dysfunction)   . Genital herpes   . Hypertension   .  Kidney disease   . Lytic lesion of bone on x-ray   . Medical history non-contributory   . Occipital lymphadenopathy   . Peripheral vascular disease (HCC)   . Renal cancer (HCC)   . Shortness of breath dyspnea    Social History   Socioeconomic History  . Marital status: Married    Spouse name: Not on file  . Number of children: Not on file  . Years of education: Not on file  . Highest education level: Not on file  Occupational History  . Not on file  Social Needs  . Financial resource strain: Not on file  . Food insecurity:    Worry: Not on file    Inability: Not on file  . Transportation needs:     Medical: Not on file    Non-medical: Not on file  Tobacco Use  . Smoking status: Current Every Day Smoker    Packs/day: 0.25    Years: 30.00    Pack years: 7.50    Types: Cigarettes  . Smokeless tobacco: Never Used  Substance and Sexual Activity  . Alcohol use: Yes    Alcohol/week: 3.0 oz    Types: 5 Cans of beer per week    Comment: 5 beers a night   . Drug use: No  . Sexual activity: Yes  Lifestyle  . Physical activity:    Days per week: Not on file    Minutes per session: Not on file  . Stress: Not on file  Relationships  . Social connections:    Talks on phone: Not on file    Gets together: Not on file    Attends religious service: Not on file    Active member of club or organization: Not on file    Attends meetings of clubs or organizations: Not on file    Relationship status: Not on file  . Intimate partner violence:    Fear of current or ex partner: Not on file    Emotionally abused: Not on file    Physically abused: Not on file    Forced sexual activity: Not on file  Other Topics Concern  . Not on file  Social History Narrative  . Not on file   Past Surgical History:  Procedure Laterality Date  . APPENDECTOMY  1966  . COLON SURGERY    . KIDNEY SURGERY Right 2012   Nephrectomy  . PARTIAL COLECTOMY  2012   For perforated colon related to diverticulitis- Dr. Egbert Garibaldi  . VASECTOMY  2000  . VIDEO ASSISTED THORACOSCOPY (VATS)/THOROCOTOMY Left 08/13/2015   Procedure: LEFT THOROCOTOMY WITH LEFT UPPER LOBECTOMY, PREOP BRONCHOSCOPY;  Surgeon: Hulda Marin, MD;  Location: [facility] ORS;  Service: General;  Laterality: Left;   Family History  Problem Relation Age of Onset  . Arthritis/Rheumatoid Unknown        Parent  . Heart disease Unknown        Grandparent  . Alcoholism Unknown        Other relative  . Arthritis Mother        Rhematoid   No Known Allergies     Assessment & Plan:  Presents as a new patient referred by Dr. Birdie Sons for evaluation of decreased  pedal pulses.  The patient has a past medical history of metastatic right renal cancer, s/p G-tube placement, recent diagnosis with scleroderma current tobacco abuse.  The patient presents today with progressively worsening right big toe pain.  The patient notes his discomfort worsens with ambulation.  The  patient denies any rest pain.  The patient now has an ulcer forming at the tip of the right big toe.  Notes that it is "gray" and not healing.  During a recent physical exam patient notes that Dr. Birdie Sons was unable to palpate pedal pulses to the bilateral lower extremity.  The patient denies any left lower extremity symptoms.  Patient denies any fever, nausea or vomiting.  1. Skin ulcer of toe of right foot with fat layer exposed (HCC) - New Patient with multiple risk factors including current tobacco use for peripheral artery disease Hard to palpate pedal pulses to the right lower extremity on exam with the foot turning cooler approximately midfoot Nonhealing ulceration to the tip of the first right toe Recommend a right lower extremity angiogram with possible intervention to assess the patient's anatomy and degree of contributing peripheral artery disease.  If appropriate an attempt to revascularize the leg can be made at that time. Procedure, risks and benefits explained to the patient All questions answered The patient wishes to proceed  2. Tobacco abuse - Stable We had a discussion for approximately 5 minutes regarding the absolute need for smoking cessation due to the deleterious nature of tobacco on the vascular system. We discussed the tobacco use would diminish patency of any intervention, and likely significantly worsen progressio of disease. We discussed multiple agents for quitting including replacement therapy or medications to reduce cravings such as Chantix. The patient voices their understanding of the importance of smoking cessation.  Current Outpatient Medications on File Prior  to Visit  Medication Sig Dispense Refill  . acetaminophen (TYLENOL) 500 MG tablet Take 500 mg by mouth every 6 (six) hours as needed for mild pain.    Marland Kitchen ADVAIR DISKUS 250-50 MCG/DOSE AEPB   11  . amLODipine (NORVASC) 2.5 MG tablet Take 1 tablet (2.5 mg total) by mouth daily. (Patient taking differently: Take 2.5 mg by mouth 2 (two) times daily. ) 60 tablet 3  . cabozantinib (CABOMETYX) 40 MG tablet Take 1 tablet (40 mg total) by mouth daily. Take on an empty stomach, 1 hour before or 2 hours after meals. 30 tablet 3  . meloxicam (MOBIC) 7.5 MG tablet Take 1 tablet by mouth daily.    . traMADol (ULTRAM) 50 MG tablet Take 1 tablet (50 mg total) by mouth every 6 (six) hours as needed for moderate pain or severe pain. 30 tablet 0  . lidocaine (LIDODERM) 5 % Place 1 patch onto the skin daily. Remove & Discard patch within 12 hours or as directed by MD (Patient not taking: Reported on 07/23/2017) 15 patch 0   No current facility-administered medications on file prior to visit.    There are no Patient Instructions on file for this visit. No follow-ups on file.  KIMBERLY A STEGMAYER, PA-C

## 2017-08-16 ENCOUNTER — Telehealth: Payer: Self-pay | Admitting: Internal Medicine

## 2017-08-16 NOTE — Telephone Encounter (Signed)
Thomas Le has been discussed multiple times with the patient and family the past.  Patient family wants to have PEG tube removed; however Thomas Le is concerned about nutrition/recommends keeping it in for now.   If patient/family wants to proceed with explantation of the PEG tube against our medical decision-it is their choice.  Please check with the family regarding the decision; and if they continue to be interested in having PEG tube explanted-we could refer to IR to have it taken out at [facility] go back to [place].   However if no decisions made-I can re-evaluate the issue at the next visit. Thx  GB

## 2017-08-18 ENCOUNTER — Other Ambulatory Visit: Payer: Self-pay | Admitting: *Deleted

## 2017-08-18 ENCOUNTER — Other Ambulatory Visit: Payer: Self-pay

## 2017-08-18 DIAGNOSIS — C641 Malignant neoplasm of right kidney, except renal pelvis: Secondary | ICD-10-CM

## 2017-08-18 MED ORDER — MUPIROCIN CALCIUM 2 % NA OINT: 1.0000 "application " | TOPICAL_OINTMENT | Freq: Two times a day (BID) | NASAL | 0 refills | Status: DC

## 2017-08-18 NOTE — Telephone Encounter (Signed)
Wife called reporting that patient new chemotherapy has caused sores in his nose and when he had this last year he used Bactroban and she is requesting refill

## 2017-08-19 ENCOUNTER — Other Ambulatory Visit (INDEPENDENT_AMBULATORY_CARE_PROVIDER_SITE_OTHER): Payer: Self-pay | Admitting: Vascular Surgery

## 2017-08-19 ENCOUNTER — Other Ambulatory Visit: Payer: Self-pay | Admitting: *Deleted

## 2017-08-19 DIAGNOSIS — C641 Malignant neoplasm of right kidney, except renal pelvis: Secondary | ICD-10-CM

## 2017-08-19 MED ORDER — MUPIROCIN CALCIUM 2 % NA OINT: 1.0000 "application " | TOPICAL_OINTMENT | Freq: Two times a day (BID) | NASAL | 0 refills | Status: DC

## 2017-08-20 ENCOUNTER — Telehealth: Payer: Self-pay

## 2017-08-20 NOTE — Telephone Encounter (Signed)
Nutrition  Called patient for nutrition follow-up.  Left voicemail.  Will leave 2nd case of ensure for nursing to provide to patient on Monday, July 1 at next appointment.  RD not in clinic on 7/1.  Jerry Clyne B. Zenia Resides, [place], [place] Registered Dietitian [phone] (pager)

## 2017-08-21 ENCOUNTER — Telehealth: Payer: Self-pay | Admitting: *Deleted

## 2017-08-21 NOTE — Telephone Encounter (Signed)
Call returned to wife/ patient and advised wife that per physician OK to hold chemotherapy dosing this weekend. She repeated back to me

## 2017-08-21 NOTE — Telephone Encounter (Signed)
Wife called and states that patient is not doing well with new chemotherapy and is asking if he can skip Friday Sat Sun doses since he has an appointment Monday to see if he feels any better. He is lethargic and weak and feels worse in the evenings. Please advise

## 2017-08-21 NOTE — Telephone Encounter (Signed)
Per Dr. Jacinto Reap, he says it is ok for patient to skip weekend dose of medication. Will discuss further at appt on Monday

## 2017-08-21 NOTE — Telephone Encounter (Signed)
Ok to hold off on treatment per patient's request.

## 2017-08-24 ENCOUNTER — Inpatient Hospital Stay: Payer: BLUE CROSS/BLUE SHIELD | Attending: Internal Medicine

## 2017-08-24 ENCOUNTER — Inpatient Hospital Stay (HOSPITAL_BASED_OUTPATIENT_CLINIC_OR_DEPARTMENT_OTHER): Payer: BLUE CROSS/BLUE SHIELD | Admitting: Internal Medicine

## 2017-08-24 VITALS — BP 106/65 | HR 52 | Temp 97.1°F | Resp 16 | Ht 73.0 in | Wt 141.0 lb

## 2017-08-24 DIAGNOSIS — J449 Chronic obstructive pulmonary disease, unspecified: Secondary | ICD-10-CM

## 2017-08-24 DIAGNOSIS — C641 Malignant neoplasm of right kidney, except renal pelvis: Secondary | ICD-10-CM

## 2017-08-24 DIAGNOSIS — R5383 Other fatigue: Secondary | ICD-10-CM | POA: Insufficient documentation

## 2017-08-24 DIAGNOSIS — Z79899 Other long term (current) drug therapy: Secondary | ICD-10-CM | POA: Diagnosis not present

## 2017-08-24 DIAGNOSIS — I10 Essential (primary) hypertension: Secondary | ICD-10-CM | POA: Insufficient documentation

## 2017-08-24 DIAGNOSIS — C7802 Secondary malignant neoplasm of left lung: Secondary | ICD-10-CM | POA: Diagnosis not present

## 2017-08-24 DIAGNOSIS — F17210 Nicotine dependence, cigarettes, uncomplicated: Secondary | ICD-10-CM | POA: Diagnosis not present

## 2017-08-24 DIAGNOSIS — L97518 Non-pressure chronic ulcer of other part of right foot with other specified severity: Secondary | ICD-10-CM | POA: Diagnosis not present

## 2017-08-24 DIAGNOSIS — Z931 Gastrostomy status: Secondary | ICD-10-CM | POA: Diagnosis not present

## 2017-08-24 DIAGNOSIS — C787 Secondary malignant neoplasm of liver and intrahepatic bile duct: Secondary | ICD-10-CM | POA: Diagnosis not present

## 2017-08-24 DIAGNOSIS — F329 Major depressive disorder, single episode, unspecified: Secondary | ICD-10-CM | POA: Insufficient documentation

## 2017-08-24 DIAGNOSIS — M1990 Unspecified osteoarthritis, unspecified site: Secondary | ICD-10-CM | POA: Insufficient documentation

## 2017-08-24 DIAGNOSIS — Z7952 Long term (current) use of systemic steroids: Secondary | ICD-10-CM | POA: Diagnosis not present

## 2017-08-24 DIAGNOSIS — M349 Systemic sclerosis, unspecified: Secondary | ICD-10-CM | POA: Insufficient documentation

## 2017-08-24 DIAGNOSIS — Z7982 Long term (current) use of aspirin: Secondary | ICD-10-CM | POA: Insufficient documentation

## 2017-08-24 DIAGNOSIS — R5381 Other malaise: Secondary | ICD-10-CM | POA: Diagnosis not present

## 2017-08-24 DIAGNOSIS — Z9221 Personal history of antineoplastic chemotherapy: Secondary | ICD-10-CM | POA: Insufficient documentation

## 2017-08-24 DIAGNOSIS — C7931 Secondary malignant neoplasm of brain: Secondary | ICD-10-CM | POA: Diagnosis not present

## 2017-08-24 DIAGNOSIS — Z91041 Radiographic dye allergy status: Secondary | ICD-10-CM

## 2017-08-24 DIAGNOSIS — I7300 Raynaud's syndrome without gangrene: Secondary | ICD-10-CM | POA: Insufficient documentation

## 2017-08-24 LAB — COMPREHENSIVE METABOLIC PANEL
ALK PHOS: 83 U/L (ref 38–126)
ALT: 20 U/L (ref 0–44)
ANION GAP: 9 (ref 5–15)
AST: 21 U/L (ref 15–41)
Albumin: 4.1 g/dL (ref 3.5–5.0)
BILIRUBIN TOTAL: 0.8 mg/dL (ref 0.3–1.2)
BUN: 18 mg/dL (ref 8–23)
CALCIUM: 9.1 mg/dL (ref 8.9–10.3)
CO2: 25 mmol/L (ref 22–32)
CREATININE: 0.89 mg/dL (ref 0.61–1.24)
Chloride: 103 mmol/L (ref 98–111)
Glucose, Bld: 93 mg/dL (ref 70–99)
Potassium: 4.6 mmol/L (ref 3.5–5.1)
Sodium: 137 mmol/L (ref 135–145)
Total Protein: 7.6 g/dL (ref 6.5–8.1)

## 2017-08-24 LAB — CBC WITH DIFFERENTIAL/PLATELET
BASOS PCT: 1 %
Basophils Absolute: 0 10*3/uL (ref 0–0.1)
Eosinophils Absolute: 0.3 10*3/uL (ref 0–0.7)
Eosinophils Relative: 5 %
HEMATOCRIT: 47 % (ref 40.0–52.0)
Hemoglobin: 15.7 g/dL (ref 13.0–18.0)
Lymphocytes Relative: 33 %
Lymphs Abs: 2.2 10*3/uL (ref 1.0–3.6)
MCH: 30.4 pg (ref 26.0–34.0)
MCHC: 33.5 g/dL (ref 32.0–36.0)
MCV: 90.9 fL (ref 80.0–100.0)
MONO ABS: 0.4 10*3/uL (ref 0.2–1.0)
MONOS PCT: 5 %
Neutro Abs: 3.8 10*3/uL (ref 1.4–6.5)
Neutrophils Relative %: 56 %
Platelets: 238 10*3/uL (ref 150–440)
RBC: 5.17 MIL/uL (ref 4.40–5.90)
RDW: 18.4 % — AB (ref 11.5–14.5)
WBC: 6.7 10*3/uL (ref 3.8–10.6)

## 2017-08-24 MED ORDER — PREDNISONE 50 MG PO TABS: ORAL_TABLET | ORAL | 0 refills | Status: DC

## 2017-08-24 MED ORDER — CEFAZOLIN SODIUM-DEXTROSE 2-4 GM/100ML-% IV SOLN
2.0000 g | Freq: Once | INTRAVENOUS | Status: AC
  Administered 2017-08-25: 2 g via INTRAVENOUS

## 2017-08-24 NOTE — Assessment & Plan Note (Addendum)
+#  Clear cell kidney cancer; stage IV-clinically stable; on [place] 40mg /day [may 14th 2019]. Tolerating well except for  extreme fatigue.  #However, continue current therapy; no evidence of clinical progression noted.  Given the extreme fatigue/patient preference it is okay to continue cab Monday for Friday.  # Fatigue- sec to cabo/multifactorial-smoking/COPD/raynauds etc.  #History of weight loss secondary to brain surgery; status post PEG tube-not using.  Complains of pain around the PEG tube site.  As per nutrition recommend continued PEG tube.  Given patient preference/stable recommend discontinuation of the PEG tube will make a referral to IR.  #Brain mets status post resection/hydrocephalus status post shunting-brain MRI recent June 2019 stable.  # COPD/smoking-stable.   # Scleroderma; right foot/toe ulceration-secondary to scleroderma.  Currently stable.  Currently on Norvasc/ followed by [facility] care; awaiting CTA.   #Given multiple medical problems-unfortunately overall prognosis is poor.  # follow up in 4 weeks; recommend CT chest few days prior; PEG tube.  expalnation; labs prior.   Addendum: Dye allergy; dye allergy preparation given.

## 2017-08-24 NOTE — Patient Instructions (Signed)
Prednisone 50 mg 1 tablet-13 hours prior to scan, 7 hours prior to scan & 1 hour prior to scan prior to scan and again 1 hour prior to scan Pt should take benadryl 25 mg capsule 1 capsule 1 hr prior to procedure Pt should Zantac 150 mg tablet 1 hr prior to the procedure.

## 2017-08-24 NOTE — Progress Notes (Signed)
[place] OFFICE PROGRESS NOTE  Patient Care Team: Leone Haven, MD as PCP - General (Family Medicine) Cammie Sickle, MD as Medical Oncologist (Medical Oncology)  Cancer Staging No matching staging information was found for the patient.   Oncology History   # JULY 2017 METASTATIC RCC- GOOD RISK [s/p LUL lung section;Dr.Oaks]; RLL- nodule ~37m  # June 2017- ? Frontal lesion on CT [cannot have MRI]  # RIGHT KIDNEY CANCER [incidental 2012; diverticulitis] pT3a (4.3x4.3x 3.2cm) [[facility] clear cell G-2; Neg margins; May 2012 ]  # July 20th- PAZOPANIB 4 pills/day; Discontinued sec to Elevated LFTs.   # OCT 2017- Start Sunitinib 2w-On; 1 w-OFF; CT NOV 13th- RLL- Improved; no new disease. MARCH 7th CT- CR. SUTENT-HOLD [since July 2018-sec to fatigue/poor tol]  # march 2019- Brain met [s/p resection/ s/p VP shunt; Dr.Jaikumar]; Bx of liver lesion- RCC [[facility]]  # May 12th 2019- Cabo 40 mg/day  # ? Scleroderma [Almodipine; Dr.Kernodle]- CONTRAINDICATION to IMMUNOTHERAPY.  -----------------------------------    DIAGNOSIS: '[ ]'  RCC  STAGE:  IV       ;GOALS: pallaitive  CURRENT/MOST RECENT THERAPY '[ ]'  Cabo 40 mg [may 14th]      Primary cancer of right kidney with metastasis from kidney to other site ([facility]      INTERVAL HISTORY:  Thomas BUSBY89y.o.  male pleasant patient above history of metastatic renal cell cancer currently on cabo 423mday is here for follow-up.  In the interim patient has been evaluated in wound care; for his bilateral lower extremity wounds.  He is also waiting to get a CTA of his bilateral extremities with vascular tomorrow.  Patient complains of worsening fatigue.  Has chronic shortness of breath not any worse.  Review of Systems  Constitutional: Positive for malaise/fatigue. Negative for chills, diaphoresis, fever and weight loss.  HENT: Negative for nosebleeds and sore throat.   Eyes: Negative for double vision.   Respiratory: Positive for shortness of breath. Negative for cough, hemoptysis, sputum production and wheezing.   Cardiovascular: Negative for chest pain, palpitations, orthopnea and leg swelling.  Gastrointestinal: Negative for abdominal pain, blood in stool, constipation, diarrhea, heartburn, melena, nausea and vomiting.  Genitourinary: Negative for dysuria, frequency and urgency.  Musculoskeletal: Negative for back pain and joint pain.  Skin: Negative.  Negative for itching and rash.  Neurological: Negative for dizziness, tingling, focal weakness, weakness and headaches.  Endo/Heme/Allergies: Does not bruise/bleed easily.  Psychiatric/Behavioral: Negative for depression. The patient has insomnia. The patient is not nervous/anxious.       PAST MEDICAL HISTORY :  Past Medical History:  Diagnosis Date  . Allergic rhinitis   . Anxiety   . Arthritis   . Asthma   . Chronic bronchitis ([place]  . Colitis    Had blood in his stool with this and treated in the hospital  . Colitis cystica profunda   . Colon polyps   . Complication of anesthesia    when waking up get claustophobic with mask, anxiety, panic  . Depression   . Diverticulitis   . ED (erectile dysfunction)   . Genital herpes   . Hypertension   . Kidney disease   . Lytic lesion of bone on x-ray   . Medical history non-contributory   . Occipital lymphadenopathy   . Peripheral vascular disease ([place]  . Renal cancer ([place]  . Shortness of breath dyspnea     PAST SURGICAL HISTORY :   Past Surgical History:  Procedure Laterality Date  . APPENDECTOMY  1966  . COLON SURGERY    . KIDNEY SURGERY Right 2012   Nephrectomy  . LOWER EXTREMITY ANGIOGRAPHY Left 08/25/2017   Procedure: LOWER EXTREMITY ANGIOGRAPHY;  Surgeon: Katha Cabal, MD;  Location: [place] [facility];  Service: Cardiovascular;  Laterality: Left;  . PARTIAL COLECTOMY  2012   For perforated colon related to diverticulitis- Dr. Marina Gravel  . VASECTOMY  2000  .  VIDEO ASSISTED THORACOSCOPY (VATS)/THOROCOTOMY Left 08/13/2015   Procedure: LEFT THOROCOTOMY WITH LEFT UPPER LOBECTOMY, PREOP BRONCHOSCOPY;  Surgeon: Nestor Lewandowsky, MD;  Location: [facility];  Service: General;  Laterality: Left;    FAMILY HISTORY :   Family History  Problem Relation Age of Onset  . Arthritis/Rheumatoid Unknown        Parent  . Heart disease Unknown        Grandparent  . Alcoholism Unknown        Other relative  . Arthritis Mother        Rhematoid    SOCIAL HISTORY:   Social History   Tobacco Use  . Smoking status: Current Every Day Smoker    Packs/day: 0.25    Years: 30.00    Pack years: 7.50    Types: Cigarettes  . Smokeless tobacco: Never Used  Substance Use Topics  . Alcohol use: Yes    Alcohol/week: 3.0 oz    Types: 5 Cans of beer per week    Comment: 5 beers a night   . Drug use: No    ALLERGIES:  is allergic to contrast media [iodinated diagnostic agents].  MEDICATIONS:  Current Outpatient Medications  Medication Sig Dispense Refill  . acetaminophen (TYLENOL) 500 MG tablet Take 500 mg by mouth every 6 (six) hours as needed for mild pain or headache.     . ADVAIR DISKUS 250-50 MCG/DOSE AEPB Inhale 1 puff into the lungs daily as needed (for shortness of breath or wheezing).   11  . amLODipine (NORVASC) 5 MG tablet Take 5 mg by mouth daily.  1  . cabozantinib (CABOMETYX) 40 MG tablet Take 1 tablet (40 mg total) by mouth daily. Take on an empty stomach, 1 hour before or 2 hours after meals. (Patient not taking: Reported on 08/25/2017) 30 tablet 3  . Camphor-Phenol (CAMPHO-PHENIQUE EX) Apply 1 application topically as needed (for pain).    . meloxicam (MOBIC) 7.5 MG tablet Take 7.5 mg by mouth daily.     . mupirocin nasal ointment (BACTROBAN) 2 % Place 1 application into the nose 2 (two) times daily. Use one-half of tube in each nostril twice daily for five (5) days. After application, press sides of nose together and gently massage. 10 g 0  . traMADol  (ULTRAM) 50 MG tablet Take 1 tablet (50 mg total) by mouth every 6 (six) hours as needed for moderate pain or severe pain. 30 tablet 0  . aspirin EC 81 MG tablet Take 1 tablet (81 mg total) by mouth daily. 150 tablet 2  . clopidogrel (PLAVIX) 75 MG tablet Take 1 tablet (75 mg total) by mouth daily. 30 tablet 4  . predniSONE (DELTASONE) 50 MG tablet Take 1 tablet-13 hours prior to scan, 7 hours prior to scan & 1 hour prior to the scan (Patient not taking: Reported on 08/25/2017) 3 tablet 0   No current facility-administered medications for this visit.     PHYSICAL EXAMINATION: ECOG PERFORMANCE STATUS: 2 - Symptomatic, <50% confined to bed  BP 106/65 (BP Location:  Left Arm, Patient Position: Sitting)   Pulse (!) 52   Temp (!) 97.1 F (36.2 C) (Tympanic)   Resp 16   Ht '6\' 1"'  (1.854 m)   Wt 141 lb (64 kg)   BMI 18.60 kg/m   Filed Weights   08/24/17 1434  Weight: 141 lb (64 kg)    GENERAL:  Alert, no distress and comfortable.  *He is in a wheelchair.  Accompanied by family.  EYES: no pallor or icterus OROPHARYNX: no thrush or ulceration; NECK: supple; no lymph nodes felt. LYMPH:  no palpable lymphadenopathy in the axillary or inguinal regions LUNGS: Decreased breath sounds auscultation bilaterally. No wheeze or crackles HEART/CVS: regular rate & rhythm and no murmurs; No lower extremity edema ABDOMEN:abdomen soft, non-tender and normal bowel sounds. No hepatomegaly or splenomegaly.  Musculoskeletal:no cyanosis of digits and no clubbing  PSYCH: alert & oriented x 3 with fluent speech NEURO: no focal motor/sensory deficits SKIN:  no rashes or significant lesions    LABORATORY DATA:  I have reviewed the data as listed    Component Value Date/Time   NA 137 08/24/2017 1407   NA 137 12/08/2012 0529   K 4.6 08/24/2017 1407   K 4.0 12/08/2012 0529   CL 103 08/24/2017 1407   CL 106 12/08/2012 0529   CO2 25 08/24/2017 1407   CO2 24 12/08/2012 0529   GLUCOSE 93 08/24/2017 1407    GLUCOSE 76 12/08/2012 0529   BUN 18 08/24/2017 1407   BUN 6 (L) 12/08/2012 0529   CREATININE 0.89 08/24/2017 1407   CREATININE 1.11 12/08/2012 0529   CALCIUM 9.1 08/24/2017 1407   CALCIUM 8.1 (L) 12/08/2012 0529   PROT 7.6 08/24/2017 1407   PROT 6.9 12/06/2012 0901   ALBUMIN 4.1 08/24/2017 1407   ALBUMIN 3.5 12/06/2012 0901   AST 21 08/24/2017 1407   AST 13 (L) 12/06/2012 0901   ALT 20 08/24/2017 1407   ALT 15 12/06/2012 0901   ALKPHOS 83 08/24/2017 1407   ALKPHOS 73 12/06/2012 0901   BILITOT 0.8 08/24/2017 1407   BILITOT 0.8 12/06/2012 0901   GFRNONAA >60 08/24/2017 1407   GFRNONAA >60 12/08/2012 0529   GFRAA >60 08/24/2017 1407   GFRAA >60 12/08/2012 0529    No results found for: SPEP, UPEP  Lab Results  Component Value Date   WBC 6.7 08/24/2017   NEUTROABS 3.8 08/24/2017   HGB 15.7 08/24/2017   HCT 47.0 08/24/2017   MCV 90.9 08/24/2017   PLT 238 08/24/2017      Chemistry      Component Value Date/Time   NA 137 08/24/2017 1407   NA 137 12/08/2012 0529   K 4.6 08/24/2017 1407   K 4.0 12/08/2012 0529   CL 103 08/24/2017 1407   CL 106 12/08/2012 0529   CO2 25 08/24/2017 1407   CO2 24 12/08/2012 0529   BUN 18 08/24/2017 1407   BUN 6 (L) 12/08/2012 0529   CREATININE 0.89 08/24/2017 1407   CREATININE 1.11 12/08/2012 0529      Component Value Date/Time   CALCIUM 9.1 08/24/2017 1407   CALCIUM 8.1 (L) 12/08/2012 0529   ALKPHOS 83 08/24/2017 1407   ALKPHOS 73 12/06/2012 0901   AST 21 08/24/2017 1407   AST 13 (L) 12/06/2012 0901   ALT 20 08/24/2017 1407   ALT 15 12/06/2012 0901   BILITOT 0.8 08/24/2017 1407   BILITOT 0.8 12/06/2012 0901       RADIOGRAPHIC STUDIES: I have personally reviewed the radiological images  as listed and agreed with the findings in the report. No results found.   ASSESSMENT & PLAN:  Primary cancer of right kidney with metastasis from kidney to other site [facility]) +#Clear cell kidney cancer; stage IV-clinically stable; on Iuka  33m/day [may 14th 2019]. Tolerating well except for  extreme fatigue.  #However, continue current therapy; no evidence of clinical progression noted.  Given the extreme fatigue/patient preference it is okay to continue cab Monday for Friday.  # Fatigue- sec to cabo/multifactorial-smoking/COPD/raynauds etc.  #History of weight loss secondary to brain surgery; status post PEG tube-not using.  Complains of pain around the PEG tube site.  As per nutrition recommend continued PEG tube.  Given patient preference/stable recommend discontinuation of the PEG tube will make a referral to IR.  #Brain mets status post resection/hydrocephalus status post shunting-brain MRI recent June 2019 stable.  # COPD/smoking-stable.   # Scleroderma; right foot/toe ulceration-secondary to scleroderma.  Currently stable.  [facility]   #Given multiple medical problems-unfortunately overall prognosis is poor.  # follow up in 4 weeks; recommend CT chest few days prior; PEG tube.  expalnation; labs prior.   Addendum: Dye allergy; dye allergy preparation given.     Orders Placed This Encounter  Procedures  . CT CHEST W CONTRAST    Standing Status:   Future    Standing Expiration Date:   08/25/2018    Order Specific Question:   If indicated for the ordered procedure, I authorize the administration of contrast media per Radiology protocol    Answer:   Yes    Order Specific Question:   Preferred imaging location?    Answer:   [facility]    Order Specific Question:   Radiology Contrast Protocol - do NOT remove file path    Answer:   [email or web address]  . IR GASTROSTOMY TUBE REMOVAL/REPAIR    Standing Status:   Future    Standing Expiration Date:   10/26/2018    Order Specific Question:   Reason for Exam (SYMPTOM  OR DIAGNOSIS REQUIRED)    Answer:   Feeding tube removal    Order Specific Question:   Preferred Imaging Location?     Answer:   [facility]  . CBC with Differential/Platelet    Standing Status:   Future    Standing Expiration Date:   08/25/2018  . Comprehensive metabolic panel    Standing Status:   Future    Standing Expiration Date:   08/25/2018  . Lactate dehydrogenase    Standing Status:   Future    Standing Expiration Date:   08/25/2018   All questions were answered. The patient knows to call the clinic with any problems, questions or concerns.      GCammie Sickle MD 08/27/2017 9:37 PM

## 2017-08-25 ENCOUNTER — Encounter: Payer: Self-pay | Admitting: *Deleted

## 2017-08-25 ENCOUNTER — Emergency Department
Admission: EM | Admit: 2017-08-25 | Discharge: 2017-08-26 | Disposition: A | Discharge status: Home / Self Care | Payer: BLUE CROSS/BLUE SHIELD | Attending: Emergency Medicine | Admitting: Emergency Medicine

## 2017-08-25 ENCOUNTER — Encounter
Admission: RE | Disposition: A | Discharge status: Home / Self Care | Payer: Self-pay | Source: Ambulatory Visit | Attending: Vascular Surgery

## 2017-08-25 ENCOUNTER — Ambulatory Visit
Admission: RE | Admit: 2017-08-25 | Discharge: 2017-08-25 | Disposition: A | Discharge status: Home / Self Care | Payer: BLUE CROSS/BLUE SHIELD | Source: Ambulatory Visit | Attending: Vascular Surgery | Admitting: Vascular Surgery

## 2017-08-25 DIAGNOSIS — Z86010 Personal history of colonic polyps: Secondary | ICD-10-CM

## 2017-08-25 DIAGNOSIS — Z7902 Long term (current) use of antithrombotics/antiplatelets: Secondary | ICD-10-CM | POA: Insufficient documentation

## 2017-08-25 DIAGNOSIS — Z8261 Family history of arthritis: Secondary | ICD-10-CM

## 2017-08-25 DIAGNOSIS — L97512 Non-pressure chronic ulcer of other part of right foot with fat layer exposed: Secondary | ICD-10-CM

## 2017-08-25 DIAGNOSIS — Z905 Acquired absence of kidney: Secondary | ICD-10-CM

## 2017-08-25 DIAGNOSIS — Z79899 Other long term (current) drug therapy: Secondary | ICD-10-CM | POA: Insufficient documentation

## 2017-08-25 DIAGNOSIS — Z8249 Family history of ischemic heart disease and other diseases of the circulatory system: Secondary | ICD-10-CM

## 2017-08-25 DIAGNOSIS — I70235 Atherosclerosis of native arteries of right leg with ulceration of other part of foot: Secondary | ICD-10-CM | POA: Diagnosis not present

## 2017-08-25 DIAGNOSIS — I10 Essential (primary) hypertension: Secondary | ICD-10-CM | POA: Insufficient documentation

## 2017-08-25 DIAGNOSIS — Z791 Long term (current) use of non-steroidal anti-inflammatories (NSAID): Secondary | ICD-10-CM | POA: Diagnosis not present

## 2017-08-25 DIAGNOSIS — N529 Male erectile dysfunction, unspecified: Secondary | ICD-10-CM | POA: Insufficient documentation

## 2017-08-25 DIAGNOSIS — Z79891 Long term (current) use of opiate analgesic: Secondary | ICD-10-CM | POA: Insufficient documentation

## 2017-08-25 DIAGNOSIS — Z8619 Personal history of other infectious and parasitic diseases: Secondary | ICD-10-CM

## 2017-08-25 DIAGNOSIS — Z7982 Long term (current) use of aspirin: Secondary | ICD-10-CM | POA: Insufficient documentation

## 2017-08-25 DIAGNOSIS — J45909 Unspecified asthma, uncomplicated: Secondary | ICD-10-CM | POA: Insufficient documentation

## 2017-08-25 DIAGNOSIS — Z9049 Acquired absence of other specified parts of digestive tract: Secondary | ICD-10-CM | POA: Insufficient documentation

## 2017-08-25 DIAGNOSIS — I70238 Atherosclerosis of native arteries of right leg with ulceration of other part of lower right leg: Secondary | ICD-10-CM

## 2017-08-25 DIAGNOSIS — Z9852 Vasectomy status: Secondary | ICD-10-CM | POA: Insufficient documentation

## 2017-08-25 DIAGNOSIS — I129 Hypertensive chronic kidney disease with stage 1 through stage 4 chronic kidney disease, or unspecified chronic kidney disease: Secondary | ICD-10-CM

## 2017-08-25 DIAGNOSIS — Z85528 Personal history of other malignant neoplasm of kidney: Secondary | ICD-10-CM

## 2017-08-25 DIAGNOSIS — F17210 Nicotine dependence, cigarettes, uncomplicated: Secondary | ICD-10-CM | POA: Insufficient documentation

## 2017-08-25 DIAGNOSIS — Z9889 Other specified postprocedural states: Secondary | ICD-10-CM

## 2017-08-25 DIAGNOSIS — I97618 Postprocedural hemorrhage and hematoma of a circulatory system organ or structure following other circulatory system procedure: Secondary | ICD-10-CM | POA: Diagnosis not present

## 2017-08-25 DIAGNOSIS — N189 Chronic kidney disease, unspecified: Secondary | ICD-10-CM

## 2017-08-25 DIAGNOSIS — I70299 Other atherosclerosis of native arteries of extremities, unspecified extremity: Secondary | ICD-10-CM

## 2017-08-25 DIAGNOSIS — L97909 Non-pressure chronic ulcer of unspecified part of unspecified lower leg with unspecified severity: Secondary | ICD-10-CM

## 2017-08-25 DIAGNOSIS — I70228 Atherosclerosis of native arteries of extremities with rest pain, other extremity: Secondary | ICD-10-CM

## 2017-08-25 HISTORY — PX: LOWER EXTREMITY ANGIOGRAPHY: CATH118251

## 2017-08-25 SURGERY — LOWER EXTREMITY ANGIOGRAPHY
Anesthesia: Moderate Sedation | Laterality: Left

## 2017-08-25 MED ORDER — FENTANYL CITRATE (PF) 100 MCG/2ML IJ SOLN
INTRAMUSCULAR | Status: DC | PRN
  Administered 2017-08-25: 50 ug via INTRAVENOUS
  Administered 2017-08-25 (×2): 25 ug via INTRAVENOUS
  Administered 2017-08-25: 50 ug via INTRAVENOUS

## 2017-08-25 MED ORDER — FENTANYL CITRATE (PF) 100 MCG/2ML IJ SOLN
INTRAMUSCULAR | Status: AC
  Filled 2017-08-25: qty 2

## 2017-08-25 MED ORDER — CLOPIDOGREL BISULFATE 75 MG PO TABS: 75.0000 mg | ORAL_TABLET | Freq: Every day | ORAL | 4 refills | Status: DC

## 2017-08-25 MED ORDER — ONDANSETRON HCL 4 MG/2ML IJ SOLN: 4.0000 mg | Freq: Four times a day (QID) | INTRAMUSCULAR | Status: DC | PRN

## 2017-08-25 MED ORDER — OXYCODONE HCL 5 MG PO TABS: 5.0000 mg | ORAL_TABLET | ORAL | Status: DC | PRN

## 2017-08-25 MED ORDER — MIDAZOLAM HCL 5 MG/5ML IJ SOLN
INTRAMUSCULAR | Status: AC
  Filled 2017-08-25: qty 5

## 2017-08-25 MED ORDER — SODIUM CHLORIDE 0.9 % IV SOLN: INTRAVENOUS | Status: DC

## 2017-08-25 MED ORDER — METHYLPREDNISOLONE SODIUM SUCC 125 MG IJ SOLR
125.0000 mg | INTRAMUSCULAR | Status: DC | PRN
  Administered 2017-08-25: 125 mg via INTRAVENOUS

## 2017-08-25 MED ORDER — CLOPIDOGREL BISULFATE 75 MG PO TABS
ORAL_TABLET | ORAL | Status: AC
  Filled 2017-08-25: qty 4

## 2017-08-25 MED ORDER — HYDROMORPHONE HCL 1 MG/ML IJ SOLN
1.0000 mg | Freq: Once | INTRAMUSCULAR | Status: AC | PRN
  Administered 2017-08-25: 0.5 mg via INTRAVENOUS

## 2017-08-25 MED ORDER — HEPARIN SODIUM (PORCINE) 1000 UNIT/ML IJ SOLN
INTRAMUSCULAR | Status: DC | PRN
  Administered 2017-08-25: 4000 [IU] via INTRAVENOUS

## 2017-08-25 MED ORDER — SODIUM CHLORIDE 0.9% FLUSH: 3.0000 mL | INTRAVENOUS | Status: DC | PRN

## 2017-08-25 MED ORDER — ASPIRIN EC 81 MG PO TBEC: 81.0000 mg | DELAYED_RELEASE_TABLET | Freq: Every day | ORAL | 2 refills | Status: DC

## 2017-08-25 MED ORDER — HEPARIN (PORCINE) IN NACL 1000-0.9 UT/500ML-% IV SOLN
INTRAVENOUS | Status: AC
  Filled 2017-08-25: qty 1000

## 2017-08-25 MED ORDER — LABETALOL HCL 5 MG/ML IV SOLN: 10.0000 mg | INTRAVENOUS | Status: DC | PRN

## 2017-08-25 MED ORDER — SODIUM CHLORIDE 0.9 % IV SOLN
INTRAVENOUS | Status: DC
  Administered 2017-08-25: 11:00:00 via INTRAVENOUS

## 2017-08-25 MED ORDER — FAMOTIDINE 20 MG PO TABS
40.0000 mg | ORAL_TABLET | ORAL | Status: DC | PRN
  Administered 2017-08-25: 40 mg via ORAL

## 2017-08-25 MED ORDER — METHYLPREDNISOLONE SODIUM SUCC 125 MG IJ SOLR
INTRAMUSCULAR | Status: AC
  Filled 2017-08-25: qty 2

## 2017-08-25 MED ORDER — ACETAMINOPHEN 325 MG PO TABS: 650.0000 mg | ORAL_TABLET | ORAL | Status: DC | PRN

## 2017-08-25 MED ORDER — IOPAMIDOL (ISOVUE-300) INJECTION 61%
INTRAVENOUS | Status: DC | PRN
  Administered 2017-08-25: 110 mL via INTRA_ARTERIAL

## 2017-08-25 MED ORDER — CLOPIDOGREL BISULFATE 300 MG PO TABS
300.0000 mg | ORAL_TABLET | ORAL | Status: AC
  Administered 2017-08-25: 300 mg via ORAL

## 2017-08-25 MED ORDER — FAMOTIDINE 20 MG PO TABS
ORAL_TABLET | ORAL | Status: AC
  Filled 2017-08-25: qty 2

## 2017-08-25 MED ORDER — MORPHINE SULFATE (PF) 4 MG/ML IV SOLN: 2.0000 mg | INTRAVENOUS | Status: DC | PRN

## 2017-08-25 MED ORDER — HYDRALAZINE HCL 20 MG/ML IJ SOLN: 5.0000 mg | INTRAMUSCULAR | Status: DC | PRN

## 2017-08-25 MED ORDER — LIDOCAINE-EPINEPHRINE 1 %-1:100000 IJ SOLN
10.0000 mL | Freq: Once | INTRAMUSCULAR | Status: AC
  Administered 2017-08-25: 10 mL via INTRADERMAL
  Filled 2017-08-25: qty 10

## 2017-08-25 MED ORDER — SODIUM CHLORIDE 0.9% FLUSH: 3.0000 mL | Freq: Two times a day (BID) | INTRAVENOUS | Status: DC

## 2017-08-25 MED ORDER — LIDOCAINE HCL (PF) 1 % IJ SOLN
INTRAMUSCULAR | Status: AC
  Filled 2017-08-25: qty 30

## 2017-08-25 MED ORDER — MIDAZOLAM HCL 2 MG/2ML IJ SOLN
INTRAMUSCULAR | Status: DC | PRN
  Administered 2017-08-25 (×2): 1 mg via INTRAVENOUS
  Administered 2017-08-25: 2 mg via INTRAVENOUS
  Administered 2017-08-25: 1 mg via INTRAVENOUS

## 2017-08-25 MED ORDER — HEPARIN SODIUM (PORCINE) 1000 UNIT/ML IJ SOLN
INTRAMUSCULAR | Status: AC
  Filled 2017-08-25: qty 1

## 2017-08-25 MED ORDER — SODIUM CHLORIDE 0.9 % IV SOLN: 250.0000 mL | INTRAVENOUS | Status: DC | PRN

## 2017-08-25 MED ORDER — HYDROMORPHONE HCL 1 MG/ML IJ SOLN
INTRAMUSCULAR | Status: AC
  Filled 2017-08-25: qty 0.5

## 2017-08-25 MED ORDER — ASPIRIN EC 81 MG PO TBEC
162.0000 mg | DELAYED_RELEASE_TABLET | ORAL | Status: AC
  Administered 2017-08-25: 162 mg via ORAL
  Filled 2017-08-25: qty 2

## 2017-08-25 SURGICAL SUPPLY — 33 items
BALLN LUTONIX 018 6X60X130 (BALLOONS) ×4
BALLN ULTRASCORE 5X40X130 (BALLOONS) ×2
BALLN ULTRVRSE 018 2.5X100X150 (BALLOONS) ×2
BALLN ULTRVRSE 3X80X150 (BALLOONS) ×1
BALLN ULTRVRSE 3X80X150 OTW (BALLOONS) ×1
BALLOON LUTONIX 018 6X60X130 (BALLOONS) ×2 IMPLANT
BALLOON ULTRASCORE 5X40X130 (BALLOONS) ×1 IMPLANT
BALLOON ULTRVRSE 3X80X150 OTW (BALLOONS) ×1 IMPLANT
BALLOON ULTRVS 018 2.5X100X150 (BALLOONS) ×1 IMPLANT
CATH CROSSER S6 154CM (CATHETERS) ×2 IMPLANT
CATH CXI SUPP ANG 2.6FR 150CM (CATHETERS) ×2 IMPLANT
CATH PIG 70CM (CATHETERS) ×2 IMPLANT
CATH USHER TPER 130CM (CATHETERS) ×2 IMPLANT
CATH VERT 5FR 125CM (CATHETERS) ×2 IMPLANT
DEVICE PRESTO INFLATION (MISCELLANEOUS) ×2 IMPLANT
DEVICE SAFEGUARD 24CM (GAUZE/BANDAGES/DRESSINGS) ×2 IMPLANT
DEVICE STARCLOSE SE CLOSURE (Vascular Products) ×2 IMPLANT
DEVICE TORQUE .025-.038 (MISCELLANEOUS) ×2 IMPLANT
GUIDEWIRE PFTE-COATED .018X300 (WIRE) ×2 IMPLANT
KIT FLOWMATE PROCEDURAL (KITS) ×2 IMPLANT
NEEDLE ENTRY 21GA 7CM ECHOTIP (NEEDLE) ×2 IMPLANT
PACK ANGIOGRAPHY (CUSTOM PROCEDURE TRAY) ×2 IMPLANT
SET INTRO CAPELLA COAXIAL (SET/KITS/TRAYS/PACK) ×2 IMPLANT
SHEATH BRITE TIP 5FRX11 (SHEATH) ×2 IMPLANT
SHEATH RAABE 6FR (SHEATH) ×2 IMPLANT
SHIELD RADPAD SCOOP 12X17 (MISCELLANEOUS) ×2 IMPLANT
STENT LIFESTENT 5F 6X60X135 (Permanent Stent) ×2 IMPLANT
STENT VIABAHN 6X50X120 (Permanent Stent) ×2 IMPLANT
TUBING CONTRAST HIGH PRESS 72 (TUBING) ×2 IMPLANT
WIRE AQUATRACK .035X260CM (WIRE) ×2 IMPLANT
WIRE G V18X300CM (WIRE) ×2 IMPLANT
WIRE J 3MM .035X145CM (WIRE) ×2 IMPLANT
WIRE MAGIC TORQUE 315CM (WIRE) ×2 IMPLANT

## 2017-08-25 NOTE — H&P (Signed)
[facility] History & Physical Update  The patient was interviewed and re-examined.  The patient's previous History and Physical has been reviewed and is unchanged.  There is no change in the plan of care. We plan to proceed with the scheduled procedure.  Hortencia Pilar, MD  08/25/2017, 11:09 AM

## 2017-08-25 NOTE — ED Triage Notes (Signed)
Pt had a cardiac cath this am and has bleeding from left groin incision

## 2017-08-25 NOTE — Op Note (Signed)
[facility] Percutaneous Study/Intervention Procedural Note   Date of Surgery: 08/25/2017  Surgeon:  Katha Cabal, MD.  Pre-operative Diagnosis:   Post-operative diagnosis: Same  Procedure(s) Performed: 1. Introduction catheter into right lower extremity 3rd order catheter placement with additional third order catheter placement 2. Contrast injection right lower extremity for distal runoff   3. Percutaneous transluminal angioplasty and stent placement in 2 locations right superficial femoral artery and popliteal artery 4. Crosser atherectomy right tibioperoneal trunk and peroneal artery with angioplasty to 2.5 mm successful              5.  Crosser atherectomy right anterior tibial artery with successful intraluminal reentry               6.  Star close closure left common femoral arteriotomy  Anesthesia: Conscious sedation was administered under my direct supervision by the interventional radiology RN. IV Versed plus fentanyl were utilized. Continuous ECG, pulse oximetry and blood pressure was monitored throughout the entire procedure.  Conscious sedation was for a total of 141 minutes.  Sheath: 6 [place] Raby left common femoral artery  Contrast: 110 cc  Fluoroscopy Time: 21.1 minutes  Indications: [name] presents with ulceration and rest pain of the right great toe.  This places him at risk for limb loss.  The risks and benefits are reviewed all questions answered patient agrees to proceed.  Procedure: CASSELL VOORHIES is a 62 y.o. y.o. male who was identified and appropriate procedural time out was performed. The patient was then placed supine on the table and prepped and draped in the usual sterile fashion.   Ultrasound was placed in the sterile sleeve and the left groin was evaluated the left common femoral artery was echolucent and pulsatile indicating patency.  Image was recorded for  the permanent record and under real-time visualization a microneedle was inserted into the common femoral artery microwire followed by a micro-sheath.  A J-wire was then advanced through the micro-sheath and a  5 [place] sheath was then inserted over a J-wire. J-wire was then advanced and a 5 French pigtail catheter was positioned at the level of T12. AP projection of the aorta was then obtained. Pigtail catheter was repositioned to above the bifurcation and a LAO view of the pelvis was obtained.  Subsequently a rim catheter with the stiff angle Glidewire was used to cross the aortic bifurcation the catheter wire were advanced down into the right distal external iliac artery. Oblique view of the femoral bifurcation was then obtained and subsequently the wire was reintroduced and the pigtail catheter negotiated into the SFA representing third order catheter placement. Distal runoff was then performed.  5000 units of heparin was then given and allowed to circulate and a 6 [place] Raby sheath was advanced up and over the bifurcation and positioned in the femoral artery  KMP  catheter and stiff angle Glidewire were then negotiated down into the distal popliteal.  Distal runoff was then completed by hand injection through the catheter.  Initial findings included a 80% stenosis in the proximal one third of the SFA.  A string sign, greater than 99% stenosis with a large densely calcified eccentric plaque noted in the proximal popliteal.  The trifurcation is occluded including the tibioperoneal trunk proximal peroneal and the posterior tibial and its proximal two thirds.  There is distal reconstitution of the posterior tibial at the ankle level.  The anterior tibial occludes approximately 2 cm after its origin and appears  to reconstitute in its midportion.  The Crosser S6 atherectomy catheter was then delivered onto the field and prepped.  V 18 wire was then negotiated through the Kumpe catheter into the tibioperoneal  trunk and the Kumpe catheter exchanged for an Usher catheter.  V 18 wire was removed and the Crosser S6 catheter was used to perform crossing atherectomy into the mid peroneal.  Hand-injection of contrast through the Usher demonstrated intraluminal positioning and the 018 wire was reintroduced.  A 2.5 mm x 10 cm Ultraverse balloon was then advanced across the occlusion and stenosis inflated to 12 atm for 2 minutes.  Follow-up hand-injection contrast now demonstrated wide patency with less than 10% residual stenosis of the tibioperoneal trunk and peroneal are patent and filled down to the foot.  Kumpe catheter and V 18 wire were then used to select the anterior tibial which represents the additional third order catheter placement.  Usher catheter was exchanged and the Crosser S6 catheter was now advanced into the anterior tibial.  The crosser was then negotiated to the mid anterior tibial level.  Usher catheter was advanced and noted by hand-injection to be intraluminal to areas of focal stenosis greater than 80% were still noted the first approximately 1 cm distally the second approximately 2-1/2 cm distally.  V 18 wire was reintroduced and the Usher catheter was exchanged for a CXI angled 018 catheter.  During the procedure the patient was moving on a frequent basis.  He has involuntary jerking motions and it was at this time that he moved to a significant degree.  This was at the time of manipulating the wire.  Follow-up imaging after this incident demonstrated significant dissection in the anterior tibial.  Attempts at crossing back into the true lumen were not successful again but there was extensive motion ongoing.  And since the peroneal had been revascularized I elected to address the SFA and popliteal lesions.  The detector was then repositioned in the distal lesion which was within the popliteal was addressed.  Earlier in the case a 5 mm ultra cross balloon been used to angioplasty this area it did not  demonstrate sufficient expansion of the lesion and given the densely calcified eccentric characteristic I selected a 6 mm x 50 mm via bond stent this was deployed across this lesion and a 6 mm x 60 mm Lutonix drug-eluting balloon was used to angioplasty the popliteal artery. Inflations were to 12  atmospheres for 2 minutes. Follow-up imaging demonstrated patency with less than 15% residual stenosis.  The detector was then positioned to the proximal one third of the SFA and the 80% stenosis previously noted was identified this to would been treated with the ultra cross balloon earlier in the case but did not yield an adequate expansion.  A 6 mm x 60 life stent was deployed across this stenosis.  Subsequently, a 6 mm x 60 Lutonix balloon was utilized inflating to 12 atm for 2 full minutes. Follow-up imaging demonstrated less than 5% residual stenosis.  Distal runoff was then reassessed and noted to be widely patent as described above.  The patient now has single-vessel runoff via the peroneal all the way to the foot.  After review of these images the sheath is pulled into the left external iliac oblique of the common femoral is obtained and a Star close device deployed. There no immediate complications.   Findings: The abdominal aorta is opacified with a bolus injection contrast. Left renal artery is single and widely patent there  is absence of the right renal artery. The aorta itself has diffuse disease but no hemodynamically significant lesions. The common and external iliac arteries are widely patent bilaterally.  The right common femoral is widely patent as is the profunda femoris.  The SFA does indeed have a significant stenosis greater than 80% in the proximal one third.  The distal popliteal demonstrates increasing disease with a string sign in the above-noted densely calcified lesion.  The trifurcation is heavily diseased with occlusion of the anterior tibial, tibioperoneal trunk, peroneal and  posterior tibial.  There is reconstitution of the peroneal and the anterior tibial in their mid portions.    Following crosser atherectomy and angioplasty of the peroneal and tibioperoneal trunk there is now patency with single-vessel runoff to the foot.  As described above although successful atherectomy was performed of the anterior tibial completion balloon angioplasty was not successful in this can be readdressed if the patient has persistent wound issues.  Under the circumstances I would certainly be prepared for pedal access to secure patency of the anterior tibial.  Angioplasty and stent placement of the popliteal at Hunter's canal yields an excellent result with less than 15% residual stenosis.  Angioplasty and stent placement of the proximal one third of the SFA yields an excellent result with less than 5% residual stenosis.    Summary: Successful recanalization right lower extremity for limb salvage    Disposition: Patient was taken to the recovery room in stable condition having tolerated the procedure well.  Thomas Le, Dolores Lory 08/25/2017,3:28 PM

## 2017-08-25 NOTE — ED Provider Notes (Signed)
[facility] Emergency Department Provider Note  ____________________________________________   First MD Initiated Contact with Patient 08/25/17 2329     (approximate)  I have reviewed the triage vital signs and the nursing notes.   HISTORY  Chief Complaint Wound Check   HPI Thomas Le is a 62 y.o. male who comes to the emergency department by EMS with bleeding from his left groin for about the past 7 hours or so.  Today the patient had an angiogram of his left lower extremity secondary to peripheral artery disease.  He initially went home and felt improved however around 5 PM it began to bleed and he has had difficulty stopping it.  He says he takes intermittent aspirin but does not take Plavix, Brilinta, warfarin, or any other blood thinning medication.  He has no numbness or tingling in his leg.  He has no known history of bleeding diatheses.  He has mild throbbing aching constant pain in his left groin.  Nonradiating.  Nothing seems to make it better or worse.    Past Medical History:  Diagnosis Date  . Allergic rhinitis   . Anxiety   . Arthritis   . Asthma   . Chronic bronchitis ([place])   . Colitis    Had blood in his stool with this and treated in the hospital  . Colitis cystica profunda   . Colon polyps   . Complication of anesthesia    when waking up get claustophobic with mask, anxiety, panic  . Depression   . Diverticulitis   . ED (erectile dysfunction)   . Genital herpes   . Hypertension   . Kidney disease   . Lytic lesion of bone on x-ray   . Medical history non-contributory   . Occipital lymphadenopathy   . Peripheral vascular disease ([place])   . Renal cancer ([place])   . Shortness of breath dyspnea     Patient Active Problem List   Diagnosis Date Noted  . Skin ulcer of toe of right foot with fat layer exposed ([place]) 08/10/2017  . Tobacco abuse 07/07/2017  . Goals of care, counseling/discussion 07/05/2017  . Allergic rhinitis  11/15/2015  . Primary cancer of right kidney with metastasis from kidney to other site [facility]) 08/30/2015  . Lung mass 08/13/2015  . Erectile dysfunction 06/14/2015  . Occipital lymphadenopathy 06/14/2015  . Anxiety and depression 05/14/2015  . Bilateral hand pain 05/14/2015  . Scleroderma ([place]) 05/14/2015  . Diarrhea 05/14/2015    Past Surgical History:  Procedure Laterality Date  . APPENDECTOMY  1966  . COLON SURGERY    . KIDNEY SURGERY Right 2012   Nephrectomy  . PARTIAL COLECTOMY  2012   For perforated colon related to diverticulitis- Dr. Marina Gravel  . VASECTOMY  2000  . VIDEO ASSISTED THORACOSCOPY (VATS)/THOROCOTOMY Left 08/13/2015   Procedure: LEFT THOROCOTOMY WITH LEFT UPPER LOBECTOMY, PREOP BRONCHOSCOPY;  Surgeon: Nestor Lewandowsky, MD;  Location: [facility] ORS;  Service: General;  Laterality: Left;    Prior to Admission medications   Medication Sig Start Date End Date Taking? Authorizing Provider  acetaminophen (TYLENOL) 500 MG tablet Take 500 mg by mouth every 6 (six) hours as needed for mild pain or headache.     [provider]  ADVAIR DISKUS 250-50 MCG/DOSE AEPB Inhale 1 puff into the lungs daily as needed (for shortness of breath or wheezing).  07/17/17   [provider]  amLODipine (NORVASC) 5 MG tablet Take 5 mg by mouth daily. 08/18/17   [provider]  aspirin EC 81 MG tablet Take 1 tablet (81 mg total) by mouth daily. 08/25/17   Schnier, Dolores Lory, MD  cabozantinib (CABOMETYX) 40 MG tablet Take 1 tablet (40 mg total) by mouth daily. Take on an empty stomach, 1 hour before or 2 hours after meals. Patient not taking: Reported on 08/25/2017 07/06/17   Cammie Sickle, MD  Camphor-Phenol (CAMPHO-PHENIQUE EX) Apply 1 application topically as needed (for pain).    [provider]  clopidogrel (PLAVIX) 75 MG tablet Take 1 tablet (75 mg total) by mouth daily. 08/25/17   Schnier, Dolores Lory, MD  meloxicam (MOBIC) 7.5 MG tablet Take 7.5 mg by mouth daily.      [provider]  mupirocin nasal ointment (BACTROBAN) 2 % Place 1 application into the nose 2 (two) times daily. Use one-half of tube in each nostril twice daily for five (5) days. After application, press sides of nose together and gently massage. 08/19/17   Cammie Sickle, MD  predniSONE (DELTASONE) 50 MG tablet Take 1 tablet-13 hours prior to scan, 7 hours prior to scan & 1 hour prior to the scan Patient not taking: Reported on 08/25/2017 08/24/17   Cammie Sickle, MD  traMADol (ULTRAM) 50 MG tablet Take 1 tablet (50 mg total) by mouth every 6 (six) hours as needed for moderate pain or severe pain. 07/09/17   Verlon Au, NP    Allergies Contrast media [iodinated diagnostic agents]  Family History  Problem Relation Age of Onset  . Arthritis/Rheumatoid Unknown        Parent  . Heart disease Unknown        Grandparent  . Alcoholism Unknown        Other relative  . Arthritis Mother        Rhematoid    Social History Social History   Tobacco Use  . Smoking status: Current Every Day Smoker    Packs/day: 0.25    Years: 30.00    Pack years: 7.50    Types: Cigarettes  . Smokeless tobacco: Never Used  Substance Use Topics  . Alcohol use: Yes    Alcohol/week: 3.0 oz    Types: 5 Cans of beer per week    Comment: 5 beers a night   . Drug use: No    Review of Systems Constitutional: No fever/chills Eyes: No visual changes. ENT: No sore throat. Cardiovascular: Denies chest pain. Respiratory: Denies shortness of breath. Gastrointestinal: No abdominal pain.  No nausea, no vomiting.  Genitourinary: Negative for dysuria. Musculoskeletal: Negative for back pain. Skin: Positive for wound Neurological: Negative for headaches, focal weakness or numbness.   ____________________________________________   PHYSICAL EXAM:  VITAL SIGNS: ED Triage Vitals  Enc Vitals Group     BP      Pulse      Resp      Temp      Temp src      SpO2      Weight       Height      Head Circumference      Peak Flow      Pain Score      Pain Loc      Pain Edu?      Excl. in [place]?     Constitutional: Alert and oriented x4 joking laughing pleasant cooperative speaks in full clear sentences no diaphoresis Eyes: PERRL EOMI. Head: Atraumatic. Nose: No congestion/rhinnorhea. Mouth/Throat: No trismus Neck: No stridor.   Cardiovascular: Normal  rate, regular rhythm. Grossly normal heart sounds.  Good peripheral circulation. Respiratory: Normal respiratory effort.  No retractions. Lungs CTAB and moving good air Gastrointestinal: Soft nontender Musculoskeletal: No lower extremity edema   Left lower extremity warm.  Less than 2-second capillary refill and biphasic pulse Neurologic:  Normal speech and language. No gross focal neurologic deficits are appreciated. Skin: Puncture site to left groin oozing small amount of capillary blood.  Bounding femoral pulse.   Psychiatric: Mood and affect are normal. Speech and behavior are normal.    ____________________________________________   DIFFERENTIAL includes but not limited to  Hematoma, aneurysm, pseudoaneurysm, bleeding, laceration ____________________________________________   LABS (all labs ordered are listed, but only abnormal results are displayed)  Labs Reviewed - No data to display   __________________________________________  EKG   ____________________________________________  RADIOLOGY  ____________________________________________   PROCEDURES  Procedure(s) performed: Yes  .Marland KitchenLaceration Repair Date/Time: 08/25/2017 11:52 PM Performed by: Darel Hong, MD Authorized by: Darel Hong, MD   Consent:    Consent obtained:  Verbal   Consent given by:  Patient   Risks discussed:  Infection, pain, retained foreign body, poor cosmetic result and poor wound healing Anesthesia (see MAR for exact dosages):    Anesthesia method:  Local infiltration   Local anesthetic:  Lidocaine 1% WITH  epi Laceration details:    Location:  Leg   Leg location: Left groin.   Length (cm):  0.5 Repair type:    Repair type:  Simple Exploration:    Hemostasis achieved with:  Direct pressure and epinephrine   Wound exploration: entire depth of wound probed and visualized     Contaminated: no   Treatment:    Area cleansed with:  Saline   Amount of cleaning:  Extensive   Irrigation solution:  Sterile saline   Visualized foreign bodies/material removed: no   Skin repair:    Repair method:  Sutures   Suture size:  4-0   Suture material:  Nylon   Number of sutures:  2 Approximation:    Approximation:  Close Post-procedure details:    Dressing:  Sterile dressing   Patient tolerance of procedure:  Tolerated well, no immediate complications    Critical Care performed: no  ____________________________________________   INITIAL IMPRESSION / ASSESSMENT AND PLAN / ED COURSE  Pertinent labs & imaging results that were available during my care of the patient were reviewed by me and considered in my medical decision making (see chart for details).       ----------------------------------------- 11:53 PM on 08/25/2017 -----------------------------------------  Using lidocaine with epinephrine I infiltrated the area and placed two 4-0 nylon sutures with complete hemostasis.  I confirmed a biphasic distal pulse.  ----------------------------------------- 12:37 AM on 08/26/2017 -----------------------------------------  I observe the patient more than half hour after achieving hemostasis and he is able to get up and ambulate with no return of his symptoms.  He remains neuro intact.  He is discharged home in improved condition verbalizes understanding and agreement with plan. ____________________________________________   FINAL CLINICAL IMPRESSION(S) / ED DIAGNOSES  Final diagnoses:  Postoperative hemorrhage involving circulatory system following other circulatory system procedure       NEW MEDICATIONS STARTED DURING THIS VISIT:  New Prescriptions   No medications on file     Note:  This document was prepared using Dragon voice recognition software and may include unintentional dictation errors.     Darel Hong, MD 08/26/17 [phone]

## 2017-08-26 ENCOUNTER — Encounter: Payer: Self-pay | Admitting: Vascular Surgery

## 2017-08-26 ENCOUNTER — Other Ambulatory Visit (INDEPENDENT_AMBULATORY_CARE_PROVIDER_SITE_OTHER): Payer: Self-pay

## 2017-08-26 DIAGNOSIS — L97512 Non-pressure chronic ulcer of other part of right foot with fat layer exposed: Secondary | ICD-10-CM

## 2017-08-26 NOTE — Discharge Instructions (Signed)
Today I placed a total of two 4-0 nylon sutures in your left groin.  These can come out in 3 to 5 days.  Any doctor can do this.  Follow-up as scheduled and return to the emergency department for any concerns.  It was a pleasure to take care of you today, and thank you for coming to our emergency department.  If you have any questions or concerns before leaving please ask the nurse to grab me and I'm more than happy to go through your aftercare instructions again.  If you were prescribed any opioid pain medication today such as Norco, Vicodin, Percocet, morphine, hydrocodone, or oxycodone please make sure you do not drive when you are taking this medication as it can alter your ability to drive safely.  If you have any concerns once you are home that you are not improving or are in fact getting worse before you can make it to your follow-up appointment, please do not hesitate to call 911 and come back for further evaluation.  Darel Hong, MD

## 2017-09-02 ENCOUNTER — Ambulatory Visit
Admission: RE | Admit: 2017-09-02 | Discharge: 2017-09-02 | Disposition: A | Discharge status: Home / Self Care | Payer: BLUE CROSS/BLUE SHIELD | Source: Ambulatory Visit | Attending: Internal Medicine | Admitting: Internal Medicine

## 2017-09-02 DIAGNOSIS — Z431 Encounter for attention to gastrostomy: Secondary | ICD-10-CM | POA: Diagnosis not present

## 2017-09-02 DIAGNOSIS — C641 Malignant neoplasm of right kidney, except renal pelvis: Secondary | ICD-10-CM

## 2017-09-02 HISTORY — PX: IR GASTROSTOMY TUBE REMOVAL: IMG5492

## 2017-09-02 NOTE — Procedures (Signed)
Successful bedside removal of intact balloon retention G-tube. No immediate post procedural complications.  Ronny Bacon, MD Pager #: [phone]

## 2017-09-03 ENCOUNTER — Encounter: Payer: Self-pay | Admitting: Interventional Radiology

## 2017-09-09 ENCOUNTER — Ambulatory Visit: Payer: BLUE CROSS/BLUE SHIELD

## 2017-09-10 ENCOUNTER — Telehealth: Payer: Self-pay | Admitting: *Deleted

## 2017-09-10 NOTE — Telephone Encounter (Signed)
Patient's wife called to clarify Pre-meds for his upcoming scan.  Writer reviewed test prep medication per Dr. Andre Lefort  last note.

## 2017-09-10 NOTE — Telephone Encounter (Signed)
Patient's wife states that this was resolved. She found the AVS summary with the instructions on the premeds.

## 2017-09-14 ENCOUNTER — Ambulatory Visit (INDEPENDENT_AMBULATORY_CARE_PROVIDER_SITE_OTHER): Payer: BLUE CROSS/BLUE SHIELD | Admitting: Vascular Surgery

## 2017-09-17 ENCOUNTER — Ambulatory Visit
Admission: RE | Admit: 2017-09-17 | Discharge: 2017-09-17 | Disposition: A | Discharge status: Home / Self Care | Payer: BLUE CROSS/BLUE SHIELD | Source: Ambulatory Visit | Attending: Internal Medicine | Admitting: Internal Medicine

## 2017-09-17 ENCOUNTER — Ambulatory Visit (INDEPENDENT_AMBULATORY_CARE_PROVIDER_SITE_OTHER): Payer: BLUE CROSS/BLUE SHIELD | Admitting: Vascular Surgery

## 2017-09-17 DIAGNOSIS — I2510 Atherosclerotic heart disease of native coronary artery without angina pectoris: Secondary | ICD-10-CM | POA: Insufficient documentation

## 2017-09-17 DIAGNOSIS — I700 Atherosclerosis of aorta: Secondary | ICD-10-CM | POA: Insufficient documentation

## 2017-09-17 DIAGNOSIS — C7951 Secondary malignant neoplasm of bone: Secondary | ICD-10-CM | POA: Insufficient documentation

## 2017-09-17 DIAGNOSIS — C787 Secondary malignant neoplasm of liver and intrahepatic bile duct: Secondary | ICD-10-CM | POA: Insufficient documentation

## 2017-09-17 DIAGNOSIS — R918 Other nonspecific abnormal finding of lung field: Secondary | ICD-10-CM | POA: Diagnosis not present

## 2017-09-17 DIAGNOSIS — S2232XA Fracture of one rib, left side, initial encounter for closed fracture: Secondary | ICD-10-CM | POA: Insufficient documentation

## 2017-09-17 DIAGNOSIS — D3502 Benign neoplasm of left adrenal gland: Secondary | ICD-10-CM | POA: Diagnosis not present

## 2017-09-17 DIAGNOSIS — C641 Malignant neoplasm of right kidney, except renal pelvis: Secondary | ICD-10-CM | POA: Insufficient documentation

## 2017-09-17 MED ORDER — IOHEXOL 300 MG/ML  SOLN
75.0000 mL | Freq: Once | INTRAMUSCULAR | Status: AC | PRN
  Administered 2017-09-17: 75 mL via INTRAVENOUS

## 2017-09-21 ENCOUNTER — Inpatient Hospital Stay (HOSPITAL_BASED_OUTPATIENT_CLINIC_OR_DEPARTMENT_OTHER): Payer: BLUE CROSS/BLUE SHIELD | Admitting: Internal Medicine

## 2017-09-21 ENCOUNTER — Other Ambulatory Visit: Payer: Self-pay

## 2017-09-21 ENCOUNTER — Inpatient Hospital Stay: Payer: BLUE CROSS/BLUE SHIELD

## 2017-09-21 ENCOUNTER — Ambulatory Visit (INDEPENDENT_AMBULATORY_CARE_PROVIDER_SITE_OTHER): Payer: BLUE CROSS/BLUE SHIELD | Admitting: Vascular Surgery

## 2017-09-21 VITALS — BP 99/69 | HR 77 | Temp 98.1°F | Resp 16 | Wt 136.8 lb

## 2017-09-21 DIAGNOSIS — R5383 Other fatigue: Secondary | ICD-10-CM

## 2017-09-21 DIAGNOSIS — C641 Malignant neoplasm of right kidney, except renal pelvis: Secondary | ICD-10-CM

## 2017-09-21 DIAGNOSIS — I10 Essential (primary) hypertension: Secondary | ICD-10-CM

## 2017-09-21 DIAGNOSIS — Z931 Gastrostomy status: Secondary | ICD-10-CM

## 2017-09-21 DIAGNOSIS — M1990 Unspecified osteoarthritis, unspecified site: Secondary | ICD-10-CM

## 2017-09-21 DIAGNOSIS — C787 Secondary malignant neoplasm of liver and intrahepatic bile duct: Secondary | ICD-10-CM

## 2017-09-21 DIAGNOSIS — Z9221 Personal history of antineoplastic chemotherapy: Secondary | ICD-10-CM

## 2017-09-21 DIAGNOSIS — C7931 Secondary malignant neoplasm of brain: Secondary | ICD-10-CM

## 2017-09-21 DIAGNOSIS — M349 Systemic sclerosis, unspecified: Secondary | ICD-10-CM

## 2017-09-21 DIAGNOSIS — Z7982 Long term (current) use of aspirin: Secondary | ICD-10-CM

## 2017-09-21 DIAGNOSIS — C7802 Secondary malignant neoplasm of left lung: Secondary | ICD-10-CM

## 2017-09-21 DIAGNOSIS — I7300 Raynaud's syndrome without gangrene: Secondary | ICD-10-CM

## 2017-09-21 DIAGNOSIS — L97518 Non-pressure chronic ulcer of other part of right foot with other specified severity: Secondary | ICD-10-CM

## 2017-09-21 DIAGNOSIS — J449 Chronic obstructive pulmonary disease, unspecified: Secondary | ICD-10-CM

## 2017-09-21 DIAGNOSIS — Z79899 Other long term (current) drug therapy: Secondary | ICD-10-CM

## 2017-09-21 DIAGNOSIS — F17210 Nicotine dependence, cigarettes, uncomplicated: Secondary | ICD-10-CM

## 2017-09-21 DIAGNOSIS — Z7952 Long term (current) use of systemic steroids: Secondary | ICD-10-CM

## 2017-09-21 LAB — LACTATE DEHYDROGENASE: LDH: 108 U/L (ref 98–192)

## 2017-09-21 LAB — CBC WITH DIFFERENTIAL/PLATELET
BASOS ABS: 0.1 10*3/uL (ref 0–0.1)
Basophils Relative: 1 %
Eosinophils Absolute: 0.4 10*3/uL (ref 0–0.7)
Eosinophils Relative: 6 %
HCT: 45 % (ref 40.0–52.0)
Hemoglobin: 15.1 g/dL (ref 13.0–18.0)
Lymphocytes Relative: 25 %
Lymphs Abs: 1.7 10*3/uL (ref 1.0–3.6)
MCH: 31 pg (ref 26.0–34.0)
MCHC: 33.6 g/dL (ref 32.0–36.0)
MCV: 92.3 fL (ref 80.0–100.0)
MONO ABS: 0.5 10*3/uL (ref 0.2–1.0)
Monocytes Relative: 7 %
Neutro Abs: 4.2 10*3/uL (ref 1.4–6.5)
Neutrophils Relative %: 61 %
Platelets: 241 10*3/uL (ref 150–440)
RBC: 4.87 MIL/uL (ref 4.40–5.90)
RDW: 19.8 % — AB (ref 11.5–14.5)
WBC: 6.9 10*3/uL (ref 3.8–10.6)

## 2017-09-21 LAB — COMPREHENSIVE METABOLIC PANEL
ALK PHOS: 65 U/L (ref 38–126)
ALT: 14 U/L (ref 0–44)
AST: 21 U/L (ref 15–41)
Albumin: 3.7 g/dL (ref 3.5–5.0)
Anion gap: 10 (ref 5–15)
BILIRUBIN TOTAL: 0.7 mg/dL (ref 0.3–1.2)
BUN: 18 mg/dL (ref 8–23)
CALCIUM: 8.7 mg/dL — AB (ref 8.9–10.3)
CO2: 22 mmol/L (ref 22–32)
Chloride: 105 mmol/L (ref 98–111)
Creatinine, Ser: 0.9 mg/dL (ref 0.61–1.24)
GFR calc Af Amer: >60 mL/min (ref >60)
GFR calc non Af Amer: >60 mL/min (ref >60)
Glucose, Bld: 101 mg/dL — ABNORMAL HIGH (ref 70–99)
Potassium: 3.8 mmol/L (ref 3.5–5.1)
Sodium: 137 mmol/L (ref 135–145)
Total Protein: 7.1 g/dL (ref 6.5–8.1)

## 2017-09-21 NOTE — Progress Notes (Signed)
[place] OFFICE PROGRESS NOTE  Patient Care Team: Leone Haven, MD as PCP - General (Family Medicine) Cammie Sickle, MD as Medical Oncologist (Medical Oncology)  Cancer Staging No matching staging information was found for the patient.   Oncology History   # JULY 2017 METASTATIC RCC- GOOD RISK [s/p LUL lung section;Dr.Oaks]; RLL- nodule ~34m  # June 2017- ? Frontal lesion on CT [cannot have MRI]  # RIGHT KIDNEY CANCER [incidental 2012; diverticulitis] pT3a (4.3x4.3x 3.2cm) [[facility] clear cell G-2; Neg margins; May 2012 ]  # July 20th- PAZOPANIB 4 pills/day; Discontinued sec to Elevated LFTs.   # OCT 2017- Start Sunitinib 2w-On; 1 w-OFF; CT NOV 13th- RLL- Improved; no new disease. MARCH 7th CT- CR. SUTENT-HOLD [since July 2018-sec to fatigue/poor tol]  # march 2019- Brain met [s/p resection/ s/p VP shunt; Dr.Jaikumar]; Bx of liver lesion- RCC [[facility]]  # May 12th 2019- Cabo 40 mg/day  # ? Scleroderma [Almodipine; Dr.[facility]le]- CONTRAINDICATION to IMMUNOTHERAPY; Severe PVD s/p angio & stenting [Dr.Schneir; July 2019] -----------------------------------    DIAGNOSIS: '[ ]'  RCC  STAGE:  IV ; GOALS: pallaitive  CURRENT/MOST RECENT THERAPY '[ ]'  Cabo 40 mg [may 14th]      Primary cancer of right kidney with metastasis from kidney to other site ([facility]      INTERVAL HISTORY:  Thomas [id number].o.  male pleasant patient above history of metastatic renal cell cancer currently on cabo 427mday is here for follow-up.  In the interim patient had his PEG tube taken out;   The interim patient also had a angiogram/stenting of his bilateral lower extremity arterial disease.  He continues to complain of pain around the site of the PEG tube.  Fatigue stable.  No nausea no vomiting.  Continues to complain of tingling and numbness in his feet.  Wound is healing better.  He has been depressed over the last few weeks since the death of his father.  Review of  Systems  Constitutional: Positive for malaise/fatigue. Negative for chills, diaphoresis, fever and weight loss.  HENT: Negative for nosebleeds and sore throat.   Eyes: Negative for double vision.  Respiratory: Positive for shortness of breath. Negative for cough, hemoptysis, sputum production and wheezing.   Cardiovascular: Negative for chest pain, palpitations, orthopnea and leg swelling.  Gastrointestinal: Negative for abdominal pain, blood in stool, constipation, diarrhea, heartburn, melena, nausea and vomiting.  Genitourinary: Negative for dysuria, frequency and urgency.  Musculoskeletal: Negative for back pain and joint pain.  Skin: Negative.  Negative for itching and rash.  Neurological: Positive for tingling. Negative for dizziness, focal weakness, weakness and headaches.       Discoloration of his toes-improved.  Endo/Heme/Allergies: Does not bruise/bleed easily.  Psychiatric/Behavioral: Positive for depression (No suicidal ideation). The patient has insomnia. The patient is not nervous/anxious.       PAST MEDICAL HISTORY :  Past Medical History:  Diagnosis Date  . Allergic rhinitis   . Anxiety   . Arthritis   . Asthma   . [place]  . Colitis    Had blood in his stool with this and treated in the hospital  . Colitis cystica profunda   . Colon polyps   . Complication of anesthesia    when waking up get claustophobic with mask, anxiety, panic  . Depression   . Diverticulitis   . ED (erectile dysfunction)   . Genital herpes   . Hypertension   . Kidney disease   .  Lytic lesion of bone on x-ray   . Medical history non-contributory   . Occipital lymphadenopathy   . Peripheral vascular disease ([place])   . Renal cancer ([place])   . Shortness of breath dyspnea     PAST SURGICAL HISTORY :   Past Surgical History:  Procedure Laterality Date  . APPENDECTOMY  1966  . COLON SURGERY    . IR GASTROSTOMY TUBE REMOVAL  09/02/2017  . KIDNEY SURGERY Right 2012    Nephrectomy  . LOWER EXTREMITY ANGIOGRAPHY Left 08/25/2017   Procedure: LOWER EXTREMITY ANGIOGRAPHY;  Surgeon: Katha Cabal, MD;  Location: [facility];  Service: Cardiovascular;  Laterality: Left;  . PARTIAL COLECTOMY  2012   For perforated colon related to diverticulitis- Dr. Marina Gravel  . VASECTOMY  2000  . VIDEO ASSISTED THORACOSCOPY (VATS)/THOROCOTOMY Left 08/13/2015   Procedure: LEFT THOROCOTOMY WITH LEFT UPPER LOBECTOMY, PREOP BRONCHOSCOPY;  Surgeon: Nestor Lewandowsky, MD;  Location: [facility] ORS;  Service: General;  Laterality: Left;    FAMILY HISTORY :   Family History  Problem Relation Age of Onset  . Arthritis/Rheumatoid Unknown        Parent  . Heart disease Unknown        Grandparent  . Alcoholism Unknown        Other relative  . Arthritis Mother        Rhematoid    SOCIAL HISTORY:   Social History   Tobacco Use  . Smoking status: Current Every Day Smoker    Packs/day: 0.25    Years: 30.00    Pack years: 7.50    Types: Cigarettes  . Smokeless tobacco: Never Used  Substance Use Topics  . Alcohol use: Yes    Alcohol/week: 3.0 oz    Types: 5 Cans of beer per week    Comment: 5 beers a night   . Drug use: No    ALLERGIES:  is allergic to contrast media [iodinated diagnostic agents].  MEDICATIONS:  Current Outpatient Medications  Medication Sig Dispense Refill  . acetaminophen (TYLENOL) 500 MG tablet Take 500 mg by mouth every 6 (six) hours as needed for mild pain or headache.     . ADVAIR DISKUS 250-50 MCG/DOSE AEPB Inhale 1 puff into the lungs daily as needed (for shortness of breath or wheezing).   11  . amLODipine (NORVASC) 5 MG tablet Take 5 mg by mouth daily.  1  . aspirin EC 81 MG tablet Take 1 tablet (81 mg total) by mouth daily. 150 tablet 2  . cabozantinib (CABOMETYX) 40 MG tablet Take 1 tablet (40 mg total) by mouth daily. Take on an empty stomach, 1 hour before or 2 hours after meals. (Patient not taking: Reported on 08/25/2017) 30 tablet 3  .  Camphor-Phenol (CAMPHO-PHENIQUE EX) Apply 1 application topically as needed (for pain).    Marland Kitchen clopidogrel (PLAVIX) 75 MG tablet Take 1 tablet (75 mg total) by mouth daily. 30 tablet 4  . meloxicam (MOBIC) 7.5 MG tablet Take 7.5 mg by mouth daily.     . mupirocin nasal ointment (BACTROBAN) 2 % Place 1 application into the nose 2 (two) times daily. Use one-half of tube in each nostril twice daily for five (5) days. After application, press sides of nose together and gently massage. 10 g 0  . predniSONE (DELTASONE) 50 MG tablet Take 1 tablet-13 hours prior to scan, 7 hours prior to scan & 1 hour prior to the scan (Patient not taking: Reported on 08/25/2017) 3 tablet 0  .  traMADol (ULTRAM) 50 MG tablet Take 1 tablet (50 mg total) by mouth every 6 (six) hours as needed for moderate pain or severe pain. 30 tablet 0   No current facility-administered medications for this visit.     PHYSICAL EXAMINATION: ECOG PERFORMANCE STATUS: 2 - Symptomatic, <50% confined to bed  BP 99/69 (BP Location: Left Arm, Patient Position: Sitting)   Pulse 77   Temp 98.1 F (36.7 C) (Tympanic)   Resp 16   Wt 136 lb 12.8 oz (62.1 kg)   BMI 18.05 kg/m   Filed Weights   09/21/17 1437  Weight: 136 lb 12.8 oz (62.1 kg)    GENERAL:  Alert, no distress and comfortable.  He is walking himself.  Accompanied by family.  EYES: no pallor or icterus OROPHARYNX: no thrush or ulceration; NECK: supple; no lymph nodes felt. LYMPH:  no palpable lymphadenopathy in the axillary or inguinal regions LUNGS: Decreased breath sounds auscultation bilaterally. No wheeze or crackles HEART/CVS: regular rate & rhythm and no murmurs; No lower extremity edema ABDOMEN:abdomen soft, non-tender and normal bowel sounds. No hepatomegaly or splenomegaly.  PEG tube explanted. Musculoskeletal:no cyanosis of digits and no clubbing  PSYCH: alert & oriented x 3 with fluent speech NEURO: no focal motor/sensory deficits SKIN:  : Toe ulcers bilateral lower  extremities healing.    LABORATORY DATA:  I have reviewed the data as listed    Component Value Date/Time   NA 137 09/21/2017 1413   NA 137 12/08/2012 0529   K 3.8 09/21/2017 1413   K 4.0 12/08/2012 0529   CL 105 09/21/2017 1413   CL 106 12/08/2012 0529   CO2 22 09/21/2017 1413   CO2 24 12/08/2012 0529   GLUCOSE 101 (H) 09/21/2017 1413   GLUCOSE 76 12/08/2012 0529   BUN 18 09/21/2017 1413   BUN 6 (L) 12/08/2012 0529   CREATININE 0.90 09/21/2017 1413   CREATININE 1.11 12/08/2012 0529   CALCIUM 8.7 (L) 09/21/2017 1413   CALCIUM 8.1 (L) 12/08/2012 0529   PROT 7.1 09/21/2017 1413   PROT 6.9 12/06/2012 0901   ALBUMIN 3.7 09/21/2017 1413   ALBUMIN 3.5 12/06/2012 0901   AST 21 09/21/2017 1413   AST 13 (L) 12/06/2012 0901   ALT 14 09/21/2017 1413   ALT 15 12/06/2012 0901   ALKPHOS 65 09/21/2017 1413   ALKPHOS 73 12/06/2012 0901   BILITOT 0.7 09/21/2017 1413   BILITOT 0.8 12/06/2012 0901   GFRNONAA >60 09/21/2017 1413   GFRNONAA >60 12/08/2012 0529   GFRAA >60 09/21/2017 1413   GFRAA >60 12/08/2012 0529    No results found for: SPEP, UPEP  Lab Results  Component Value Date   WBC 6.9 09/21/2017   NEUTROABS 4.2 09/21/2017   HGB 15.1 09/21/2017   HCT 45.0 09/21/2017   MCV 92.3 09/21/2017   PLT 241 09/21/2017      Chemistry      Component Value Date/Time   NA 137 09/21/2017 1413   NA 137 12/08/2012 0529   K 3.8 09/21/2017 1413   K 4.0 12/08/2012 0529   CL 105 09/21/2017 1413   CL 106 12/08/2012 0529   CO2 22 09/21/2017 1413   CO2 24 12/08/2012 0529   BUN 18 09/21/2017 1413   BUN 6 (L) 12/08/2012 0529   CREATININE 0.90 09/21/2017 1413   CREATININE 1.11 12/08/2012 0529      Component Value Date/Time   CALCIUM 8.7 (L) 09/21/2017 1413   CALCIUM 8.1 (L) 12/08/2012 0529   ALKPHOS 65  09/21/2017 1413   ALKPHOS 73 12/06/2012 0901   AST 21 09/21/2017 1413   AST 13 (L) 12/06/2012 0901   ALT 14 09/21/2017 1413   ALT 15 12/06/2012 0901   BILITOT 0.7 09/21/2017  1413   BILITOT 0.8 12/06/2012 0901       RADIOGRAPHIC STUDIES: I have personally reviewed the radiological images as listed and agreed with the findings in the report. No results found.   ASSESSMENT & PLAN:  Primary cancer of right kidney with metastasis from kidney to other site [facility]) +#Clear cell kidney cancer; stage IV-clinically stable; on [place] 65m/day [may 14th 2019]. Tolerating well except for  extreme fatigue; July 2019 CT chest and pelvis-stable disease.   #I would recommend continued cabo-40 mg a day Monday through Friday; weekends off-secondary to fatigue.  # Fatigue- sec to cabo/multifactorial-smoking/COPD/raynauds etc. Stable.  #Continued weight loss-status post PEG tube explantation recommend increase protein intake.  Discussed with nutritionist.  Given samples of boost.  #Brain mets status post resection/hydrocephalus status post shunting-June 2019 brain MRI stable.  # COPD/smoking-stable unfortunately, continues to smoke.    # PVD-improved s/p angio and stenting [Dr.Schneir]  # depression- new;  defer to Dr.Sonnenberg/ in 2 weeks.  #Scleroderma-clinically stable continue amlodipine.  #Follow-up in approximately 1 month labs.   # I reviewed the blood work- with the patient in detail; also reviewed the imaging independently [as summarized above]; and with the patient in detail.     No orders of the defined types were placed in this encounter.  All questions were answered. The patient knows to call the clinic with any problems, questions or concerns.      GCammie Sickle MD 09/21/2017 4:44 PM

## 2017-09-21 NOTE — Assessment & Plan Note (Addendum)
+#  Clear cell kidney cancer; stage IV-clinically stable; on Cabo 40mg /day [may 14th 2019]. Tolerating well except for  extreme fatigue; July 2019 CT chest and pelvis-stable disease.   #I would recommend continued cabo-40 mg a day Monday through Friday; weekends off-secondary to fatigue.  # Fatigue- sec to cabo/multifactorial-smoking/COPD/raynauds etc. Stable.  #Continued weight loss-status post PEG tube explantation recommend increase protein intake.  Discussed with nutritionist.  Given samples of boost.  #Brain mets status post resection/hydrocephalus status post shunting-June 2019 brain MRI stable.  # COPD/smoking-stable unfortunately, continues to smoke.    # PVD-improved s/p angio and stenting [Dr.Schneir]  # depression- new;  defer to Dr.Sonnenberg/ in 2 weeks.  #Scleroderma-clinically stable continue amlodipine.  #Follow-up in approximately 1 month labs.   # I reviewed the blood work- with the patient in detail; also reviewed the imaging independently [as summarized above]; and with the patient in detail.

## 2017-09-21 NOTE — Progress Notes (Signed)
Nutrition  Patient in clinic this pm requesting 2nd case of ensure enlive.  Patient did not need case on July 1.    Noted PEG tube has been removed per patient request.  Patient tired not sleeping well per wife.  Wife reports that she snores and keeps patient awake.    Patient has lost weight since PEG tube has been removed.    Intervention: Patient given 2nd case of ensure enlive today.  Encouraged patient to eat high calorie, high protein foods to maintain weight.  Next visit: as needed, patient to contact RD  Gretel Cantu B. Zenia Resides, [employer], [place] Registered Dietitian [phone] (pager)

## 2017-09-28 ENCOUNTER — Encounter (INDEPENDENT_AMBULATORY_CARE_PROVIDER_SITE_OTHER): Payer: Self-pay | Admitting: Vascular Surgery

## 2017-09-28 ENCOUNTER — Ambulatory Visit (INDEPENDENT_AMBULATORY_CARE_PROVIDER_SITE_OTHER): Payer: BLUE CROSS/BLUE SHIELD | Admitting: Vascular Surgery

## 2017-09-28 VITALS — BP 103/68 | HR 62 | Resp 16 | Ht 73.0 in | Wt 138.4 lb

## 2017-09-28 DIAGNOSIS — L97512 Non-pressure chronic ulcer of other part of right foot with fat layer exposed: Secondary | ICD-10-CM | POA: Diagnosis not present

## 2017-09-28 DIAGNOSIS — I7025 Atherosclerosis of native arteries of other extremities with ulceration: Secondary | ICD-10-CM

## 2017-09-28 DIAGNOSIS — J449 Chronic obstructive pulmonary disease, unspecified: Secondary | ICD-10-CM

## 2017-09-28 DIAGNOSIS — C641 Malignant neoplasm of right kidney, except renal pelvis: Secondary | ICD-10-CM

## 2017-10-07 ENCOUNTER — Encounter (INDEPENDENT_AMBULATORY_CARE_PROVIDER_SITE_OTHER): Payer: Self-pay | Admitting: Vascular Surgery

## 2017-10-07 DIAGNOSIS — J449 Chronic obstructive pulmonary disease, unspecified: Secondary | ICD-10-CM | POA: Insufficient documentation

## 2017-10-07 DIAGNOSIS — I7025 Atherosclerosis of native arteries of other extremities with ulceration: Secondary | ICD-10-CM | POA: Insufficient documentation

## 2017-10-07 NOTE — Progress Notes (Signed)
MRN : [id number]  Thomas Le is a 62 y.o. (18-Nov-1955) male who presents with chief complaint of  Chief Complaint  Patient presents with  . Follow-up  .  History of Present Illness: The patient is seen for evaluation of painful lower extremities and diminished pulses associated with ulceration of the foot.  The patient notes the ulcer has been present for multiple weeks and has not been improving.  It is very painful and has had some drainage.  No specific history of trauma noted by the patient.  The patient denies fever or chills.  the patient does have diabetes which has been difficult to control.  Patient notes prior to the ulcer developing the extremities were painful particularly with ambulation or activity and the discomfort is very consistent day today. Typically, the pain occurs at less than one block, progress is as activity continues to the point that the patient must stop walking. Resting including standing still for several minutes allowed resumption of the activity and the ability to walk a similar distance before stopping again. Uneven terrain and inclined shorten the distance. The pain has been progressive over the past several years.   The patient denies rest pain or dangling of an extremity off the side of the bed during the night for relief. No prior interventions or surgeries.  No history of back problems or DJD of the lumbar sacral spine.   The patient denies amaurosis fugax or recent TIA symptoms. There are no recent neurological changes noted. The patient denies history of DVT, PE or superficial thrombophlebitis. The patient denies recent episodes of angina or shortness of breath.   Current Meds  Medication Sig  . acetaminophen (TYLENOL) 500 MG tablet Take 500 mg by mouth every 6 (six) hours as needed for mild pain or headache.   . ADVAIR DISKUS 250-50 MCG/DOSE AEPB Inhale 1 puff into the lungs daily as needed (for shortness of breath or wheezing).   Marland Kitchen  amLODipine (NORVASC) 5 MG tablet Take 5 mg by mouth daily.  Marland Kitchen aspirin EC 81 MG tablet Take 1 tablet (81 mg total) by mouth daily.  . Camphor-Phenol (CAMPHO-PHENIQUE EX) Apply 1 application topically as needed (for pain).  Marland Kitchen clopidogrel (PLAVIX) 75 MG tablet Take 1 tablet (75 mg total) by mouth daily.  . meloxicam (MOBIC) 7.5 MG tablet Take 7.5 mg by mouth daily.   . mupirocin nasal ointment (BACTROBAN) 2 % Place 1 application into the nose 2 (two) times daily. Use one-half of tube in each nostril twice daily for five (5) days. After application, press sides of nose together and gently massage.  . traMADol (ULTRAM) 50 MG tablet Take 1 tablet (50 mg total) by mouth every 6 (six) hours as needed for moderate pain or severe pain.    Past Medical History:  Diagnosis Date  . Allergic rhinitis   . Anxiety   . Arthritis   . Asthma   . Chronic bronchitis (HCC)   . Colitis    Had blood in his stool with this and treated in the hospital  . Colitis cystica profunda   . Colon polyps   . Complication of anesthesia    when waking up get claustophobic with mask, anxiety, panic  . Depression   . Diverticulitis   . ED (erectile dysfunction)   . Genital herpes   . Hypertension   . Kidney disease   . Lytic lesion of bone on x-ray   . Medical history non-contributory   . Occipital  lymphadenopathy   . Peripheral vascular disease (HCC)   . Renal cancer (HCC)   . Shortness of breath dyspnea     Past Surgical History:  Procedure Laterality Date  . APPENDECTOMY  1966  . COLON SURGERY    . IR GASTROSTOMY TUBE REMOVAL  09/02/2017  . KIDNEY SURGERY Right 2012   Nephrectomy  . LOWER EXTREMITY ANGIOGRAPHY Left 08/25/2017   Procedure: LOWER EXTREMITY ANGIOGRAPHY;  Surgeon: Renford Dills, MD;  Location: [facility] INVASIVE [facility];  Service: Cardiovascular;  Laterality: Left;  . PARTIAL COLECTOMY  2012   For perforated colon related to diverticulitis- Dr. Egbert Garibaldi  . VASECTOMY  2000  . VIDEO ASSISTED  THORACOSCOPY (VATS)/THOROCOTOMY Left 08/13/2015   Procedure: LEFT THOROCOTOMY WITH LEFT UPPER LOBECTOMY, PREOP BRONCHOSCOPY;  Surgeon: Hulda Marin, MD;  Location: [facility] ORS;  Service: General;  Laterality: Left;    Social History Social History   Tobacco Use  . Smoking status: Current Every Day Smoker    Packs/day: 0.25    Years: 30.00    Pack years: 7.50    Types: Cigarettes  . Smokeless tobacco: Never Used  Substance Use Topics  . Alcohol use: Yes    Alcohol/week: 5.0 standard drinks    Types: 5 Cans of beer per week    Comment: 5 beers a night   . Drug use: No    Family History Family History  Problem Relation Age of Onset  . Arthritis/Rheumatoid Unknown        Parent  . Heart disease Unknown        Grandparent  . Alcoholism Unknown        Other relative  . Arthritis Mother        Rhematoid    Allergies  Allergen Reactions  . Contrast Media [Iodinated Diagnostic Agents] Nausea And Vomiting     REVIEW OF SYSTEMS (Negative unless checked)  Constitutional: [] Weight loss  [] Fever  [] Chills Cardiac: [] Chest pain   [] Chest pressure   [] Palpitations   [] Shortness of breath when laying flat   [] Shortness of breath with exertion. Vascular:  [x] Pain in legs with walking   [x] Pain in legs at rest  [] History of DVT   [] Phlebitis   [] Swelling in legs   [] Varicose veins   [x] Non-healing ulcers Pulmonary:   [] Uses home oxygen   [] Productive cough   [] Hemoptysis   [] Wheeze  [x] COPD   [] Asthma Neurologic:  [] Dizziness   [] Seizures   [] History of stroke   [] History of TIA  [] Aphasia   [] Vissual changes   [] Weakness or numbness in arm   [] Weakness or numbness in leg Musculoskeletal:   [x] Joint swelling   [x] Joint pain   [] Low back pain Hematologic:  [] Easy bruising  [] Easy bleeding   [] Hypercoagulable state   [] Anemic Gastrointestinal:  [] Diarrhea   [] Vomiting  [] Gastroesophageal reflux/heartburn   [] Difficulty swallowing. Genitourinary:  [x] Chronic kidney disease   [] Difficult  urination  [] Frequent urination   [] Blood in urine Skin:  [] Rashes   [] Ulcers  Psychological:  [] History of anxiety   []  History of major depression.  Physical Examination  Vitals:   09/28/17 1634  BP: 103/68  Pulse: 62  Resp: 16  Weight: 138 lb 6.4 oz (62.8 kg)  Height: 6\' 1"  (1.854 m)   Body mass index is 18.26 kg/m. Gen: WD/WN, NAD Head: [place]/AT, No temporalis wasting.  Ear/Nose/Throat: Hearing grossly intact, nares w/o erythema or drainage Eyes: PER, EOMI, sclera nonicteric.  Neck: Supple, no large masses.   Pulmonary:  Good air  movement, no audible wheezing bilaterally, no use of accessory muscles.  Cardiac: RRR, no JVD Vascular: Ulcer present right foot appears uninfected but pale and not granulating Vessel Right Left  Radial Palpable Palpable  PT Not Palpable Not Palpable  DP Not Palpable Not Palpable  Gastrointestinal: Non-distended. No guarding/no peritoneal signs.  Musculoskeletal: M/S 5/5 throughout.  No deformity or atrophy.  Neurologic: CN 2-12 intact. Symmetrical.  Speech is fluent. Motor exam as listed above. Psychiatric: Judgment intact, Mood & affect appropriate for pt's clinical situation. Dermatologic: No rashes + ulcers noted.  No changes consistent with cellulitis. Lymph : No lichenification or skin changes of chronic lymphedema.  CBC Lab Results  Component Value Date   WBC 6.9 09/21/2017   HGB 15.1 09/21/2017   HCT 45.0 09/21/2017   MCV 92.3 09/21/2017   PLT 241 09/21/2017    BMET    Component Value Date/Time   NA 137 09/21/2017 1413   NA 137 12/08/2012 0529   K 3.8 09/21/2017 1413   K 4.0 12/08/2012 0529   CL 105 09/21/2017 1413   CL 106 12/08/2012 0529   CO2 22 09/21/2017 1413   CO2 24 12/08/2012 0529   GLUCOSE 101 (H) 09/21/2017 1413   GLUCOSE 76 12/08/2012 0529   BUN 18 09/21/2017 1413   BUN 6 (L) 12/08/2012 0529   CREATININE 0.90 09/21/2017 1413   CREATININE 1.11 12/08/2012 0529   CALCIUM 8.7 (L) 09/21/2017 1413   CALCIUM 8.1 (L)  12/08/2012 0529   GFRNONAA >60 09/21/2017 1413   GFRNONAA >60 12/08/2012 0529   GFRAA >60 09/21/2017 1413   GFRAA >60 12/08/2012 0529   Estimated Creatinine Clearance: 74.6 mL/min (by C-G formula based on SCr of 0.9 mg/dL).  COAG Lab Results  Component Value Date   INR 0.95 05/06/2017   INR 1.0 05/13/2011    Radiology Ct Chest W Contrast  Result Date: 09/18/2017 CLINICAL DATA:  Right renal cell carcinoma with pulmonary metastatic disease. Chest pressure. EXAM: CT CHEST WITH CONTRAST TECHNIQUE: Multidetector CT imaging of the chest was performed during intravenous contrast administration. CONTRAST:  75mL OMNIPAQUE IOHEXOL 300 MG/ML  SOLN COMPARISON:  CT chest and MR abdomen 06/30/2017. FINDINGS: Cardiovascular: Atherosclerotic calcification of the arterial vasculature, including coronary arteries. Heart size normal. No pericardial effusion. Mediastinum/Nodes: Mediastinal and hilar lymph nodes are not enlarged by CT size criteria. No axillary adenopathy. Esophagus is grossly unremarkable. Lungs/Pleura: Mild scattered pulmonary parenchymal scarring. Postoperative changes of left upper lobectomy. 3 mm medial left lower lobe nodule (series 3, image 142) appears new. Additional scattered pulmonary nodules measure up to 4 mm in the right lower lobe, as before. No pleural fluid. Airway is otherwise unremarkable. Upper Abdomen: There are low-attenuation lesions in the right hepatic lobe with the largest cluster seen inferiorly, measuring approximately 4.0 x 4.1 cm. Visualized portion of the gallbladder is unremarkable. Right adrenalectomy and right nephrectomy. Left adrenal adenoma, as on 06/30/2017. Visualized portions of the left kidney, spleen, pancreas, stomach and bowel are otherwise grossly unremarkable. Retroperitoneal lymph nodes are not enlarged by CT size criteria. Musculoskeletal: Pathologic fracture of the posterior left eleventh rib. Lucent lesion in the left posterolateral ninth rib. Old  bilateral rib fractures. There is an expansile lesion in the right fifth rib, at the costovertebral junction. IMPRESSION: 1. New 3 mm medial left lower lobe nodule, nonspecific. Additional bilateral pulmonary nodules are unchanged. 2. Osseous metastatic disease, as before, with a pathologic fracture of the posterior left eleventh rib. 3. Low-attenuation lesions in the liver, as  before, highly worrisome for metastatic disease. 4. Aortic atherosclerosis (ICD10-170.0). Coronary artery calcification. 5. Left adrenal adenoma. Electronically Signed   By: Leanna Battles M.D.   On: 09/18/2017 10:38     Assessment/Plan 1. Atherosclerosis of native arteries of the extremities with ulceration (HCC)  Recommend:  The patient has pain symptoms and an ulcer associated with atherosclerotic disease.   Noninvasive studies including ABI's and right leg arterial ultrasound of the legs will be obtained and the patient will follow up with me to review these studies.  The patient should continue walking and begin a more formal exercise program. The patient should continue his antiplatelet therapy and aggressive treatment of the lipid abnormalities.  The patient should begin wearing graduated compression socks 15-20 mmHg strength to control edema.   2. Skin ulcer of toe of right foot with fat layer exposed (HCC) Wound care per podiatry  3. Primary cancer of right kidney with metastasis from kidney to other site [facility] Plan per oncology service  4. Chronic obstructive pulmonary disease, unspecified COPD type (HCC) Continue pulmonary medications and aerosols as already ordered, these medications have been reviewed and there are no changes at this time.      Levora Dredge, MD  10/07/2017 2:05 PM

## 2017-10-09 ENCOUNTER — Ambulatory Visit: Payer: BLUE CROSS/BLUE SHIELD | Admitting: Family Medicine

## 2017-10-21 ENCOUNTER — Encounter: Payer: Self-pay | Admitting: Internal Medicine

## 2017-10-21 ENCOUNTER — Inpatient Hospital Stay: Payer: BLUE CROSS/BLUE SHIELD

## 2017-10-21 ENCOUNTER — Other Ambulatory Visit: Payer: Self-pay

## 2017-10-21 ENCOUNTER — Inpatient Hospital Stay: Payer: BLUE CROSS/BLUE SHIELD | Attending: Internal Medicine | Admitting: Internal Medicine

## 2017-10-21 ENCOUNTER — Other Ambulatory Visit: Payer: Self-pay | Admitting: Internal Medicine

## 2017-10-21 VITALS — BP 120/73 | HR 62 | Temp 97.7°F | Resp 22 | Ht 73.0 in | Wt 140.0 lb

## 2017-10-21 DIAGNOSIS — F17210 Nicotine dependence, cigarettes, uncomplicated: Secondary | ICD-10-CM | POA: Diagnosis not present

## 2017-10-21 DIAGNOSIS — R5381 Other malaise: Secondary | ICD-10-CM

## 2017-10-21 DIAGNOSIS — F418 Other specified anxiety disorders: Secondary | ICD-10-CM | POA: Diagnosis not present

## 2017-10-21 DIAGNOSIS — J449 Chronic obstructive pulmonary disease, unspecified: Secondary | ICD-10-CM | POA: Insufficient documentation

## 2017-10-21 DIAGNOSIS — I7300 Raynaud's syndrome without gangrene: Secondary | ICD-10-CM | POA: Insufficient documentation

## 2017-10-21 DIAGNOSIS — L271 Localized skin eruption due to drugs and medicaments taken internally: Secondary | ICD-10-CM

## 2017-10-21 DIAGNOSIS — R5383 Other fatigue: Secondary | ICD-10-CM | POA: Diagnosis not present

## 2017-10-21 DIAGNOSIS — C641 Malignant neoplasm of right kidney, except renal pelvis: Secondary | ICD-10-CM | POA: Insufficient documentation

## 2017-10-21 DIAGNOSIS — M349 Systemic sclerosis, unspecified: Secondary | ICD-10-CM | POA: Diagnosis not present

## 2017-10-21 DIAGNOSIS — R64 Cachexia: Secondary | ICD-10-CM | POA: Insufficient documentation

## 2017-10-21 DIAGNOSIS — M549 Dorsalgia, unspecified: Secondary | ICD-10-CM | POA: Diagnosis not present

## 2017-10-21 DIAGNOSIS — Z79899 Other long term (current) drug therapy: Secondary | ICD-10-CM | POA: Diagnosis not present

## 2017-10-21 DIAGNOSIS — I10 Essential (primary) hypertension: Secondary | ICD-10-CM | POA: Diagnosis not present

## 2017-10-21 DIAGNOSIS — C7802 Secondary malignant neoplasm of left lung: Secondary | ICD-10-CM

## 2017-10-21 DIAGNOSIS — Z7982 Long term (current) use of aspirin: Secondary | ICD-10-CM | POA: Diagnosis not present

## 2017-10-21 DIAGNOSIS — C7931 Secondary malignant neoplasm of brain: Secondary | ICD-10-CM | POA: Diagnosis not present

## 2017-10-21 LAB — COMPREHENSIVE METABOLIC PANEL
ALK PHOS: 59 U/L (ref 38–126)
ALT: 23 U/L (ref 0–44)
ANION GAP: 9 (ref 5–15)
AST: 31 U/L (ref 15–41)
Albumin: 4 g/dL (ref 3.5–5.0)
BUN: 19 mg/dL (ref 8–23)
CALCIUM: 9 mg/dL (ref 8.9–10.3)
CO2: 24 mmol/L (ref 22–32)
CREATININE: 0.91 mg/dL (ref 0.61–1.24)
Chloride: 109 mmol/L (ref 98–111)
Glucose, Bld: 95 mg/dL (ref 70–99)
Potassium: 4.3 mmol/L (ref 3.5–5.1)
SODIUM: 142 mmol/L (ref 135–145)
TOTAL PROTEIN: 7.1 g/dL (ref 6.5–8.1)
Total Bilirubin: 0.7 mg/dL (ref 0.3–1.2)

## 2017-10-21 LAB — CBC WITH DIFFERENTIAL/PLATELET
BASOS ABS: 0.1 10*3/uL (ref 0–0.1)
BASOS PCT: 1 %
EOS ABS: 0.2 10*3/uL (ref 0–0.7)
Eosinophils Relative: 3 %
HEMATOCRIT: 42.9 % (ref 40.0–52.0)
HEMOGLOBIN: 14.2 g/dL (ref 13.0–18.0)
Lymphocytes Relative: 29 %
Lymphs Abs: 2.1 10*3/uL (ref 1.0–3.6)
MCH: 32.1 pg (ref 26.0–34.0)
MCHC: 33.2 g/dL (ref 32.0–36.0)
MCV: 96.7 fL (ref 80.0–100.0)
MONOS PCT: 7 %
Monocytes Absolute: 0.5 10*3/uL (ref 0.2–1.0)
NEUTROS ABS: 4.5 10*3/uL (ref 1.4–6.5)
NEUTROS PCT: 60 %
Platelets: 247 10*3/uL (ref 150–440)
RBC: 4.43 MIL/uL (ref 4.40–5.90)
RDW: 20.9 % — ABNORMAL HIGH (ref 11.5–14.5)
WBC: 7.4 10*3/uL (ref 3.8–10.6)

## 2017-10-21 LAB — LACTATE DEHYDROGENASE: LDH: 136 U/L (ref 98–192)

## 2017-10-21 MED ORDER — SUNITINIB MALATE 25 MG PO CAPS: 25.0000 mg | ORAL_CAPSULE | Freq: Every day | ORAL | 4 refills | Status: DC

## 2017-10-21 MED ORDER — OXYCODONE HCL 5 MG PO TABS: 5.0000 mg | ORAL_TABLET | Freq: Two times a day (BID) | ORAL | 0 refills | Status: AC | PRN

## 2017-10-21 NOTE — Patient Instructions (Signed)
STOP CABO  Call vascular doc for sooner appt for Ultrasound of legs.

## 2017-10-21 NOTE — Assessment & Plan Note (Signed)
+#  Clear cell kidney cancer; stage IV-clinically stable; on Segundo 40mg /day [may 14th 2019]. Tolerating well except for  extreme fatigue; July 2019 CT chest and pelvis-STABLE; but see discussion below.   # DISCONTINUE Cabo sec severe pain the feet [see discussion below] ; plan sutent 25mg  2 weeks on and 1 week OFF.   # Bil Foot pain- hand foot syndrome/ PVD/ scleroderma/ smoking- recommend oxycodone 5 mg BID prn.   # Fatigue- sec to cabo/multifactorial-smoking/COPD/raynauds etc. WORSE   #Brain mets status post resection/hydrocephalus status post shunting-June 2019 brain MRI- STABLE.  # COPD/smoking-stable unfortunately, continues to smoke.  STABLE.   # Depression- new;  defer to Dr.Sonnenberg/  # follow up in 2 weeks/ no labs. Discussed with alysson.   Cc; Dr. Claretha Cooper.

## 2017-10-21 NOTE — Progress Notes (Signed)
Patient c/o worsening mood swings/depression. Pt very tearful. Pt states that he cries for no apparent reason and has numerous emotional breakdowns.

## 2017-10-21 NOTE — Progress Notes (Signed)
[facility] OFFICE PROGRESS NOTE  Patient Care Team: Thomas Haven, MD as PCP - General (Family Medicine) Thomas Sickle, MD as Medical Oncologist (Medical Oncology)  Cancer Staging No matching staging information was found for the patient.   Oncology History   # JULY 2017 METASTATIC RCC- GOOD RISK [s/p LUL lung section;Thomas Le]; RLL- nodule ~68m  # June 2017- ? Frontal lesion on CT [cannot have MRI]  # RIGHT KIDNEY CANCER [incidental 2012; diverticulitis] pT3a (4.3x4.3x 3.2cm) [[facility] clear cell G-2; Neg margins; May 2012 ]  # July 20th- PAZOPANIB 4 pills/day; Discontinued sec to Elevated LFTs.   # OCT 2017- Start Sunitinib 2w-On; 1 w-OFF; CT NOV 13th- RLL- Improved; no new disease. MARCH 7th CT- CR. SUTENT-HOLD [since July 2018-sec to fatigue/poor tol]  # march 2019- Brain met [s/p resection/ s/p VP shunt; Thomas Le]; Bx of liver lesion- RCC [[facility]]  # May 12th 2019- Cabo 40 mg/day  # ? Scleroderma [Almodipine; Thomas Le]- CONTRAINDICATION to IMMUNOTHERAPY; Severe PVD s/p angio & stenting [Thomas Le; July 2019] -----------------------------------    DIAGNOSIS: '[ ]'  RCC  STAGE:  IV ; GOALS: pallaitive  CURRENT/MOST RECENT THERAPY '[ ]'  Cabo 40 mg [may 14th]      Primary cancer of right kidney with metastasis from kidney to other site ([facility]      INTERVAL HISTORY:  Thomas REBELLO89y.o.  male pleasant patient above history of kidney cancer metastatic-currently on Cabo-40 mg a day is here for follow-up.  In the interim patient was evaluated vascular surgery recommended arterial Dopplers.   Patient complains of worsening pain in his feet.  Burning pain.  Improved with oxycodone 1 a day.  He feels miserable.  Continued fatigue.  Continued shortness of breath with exertion.  Unfortunately continues to smoke.  As per the wife he has been depressed.  No suicidal ideation.  Review of Systems  Constitutional: Positive for malaise/fatigue. Negative  for chills, diaphoresis, fever and weight loss.  HENT: Negative for nosebleeds and sore throat.   Eyes: Negative for double vision.  Respiratory: Positive for shortness of breath. Negative for cough, hemoptysis, sputum production and wheezing.   Cardiovascular: Negative for chest pain, palpitations, orthopnea and leg swelling.  Gastrointestinal: Negative for abdominal pain, blood in stool, constipation, diarrhea, heartburn, melena, nausea and vomiting.  Genitourinary: Negative for dysuria, frequency and urgency.  Musculoskeletal: Positive for back pain and joint pain.  Skin: Negative.  Negative for itching and rash.  Neurological: Negative for dizziness, tingling, focal weakness, weakness and headaches.  Endo/Heme/Allergies: Does not bruise/bleed easily.  Psychiatric/Behavioral: Positive for depression. The patient is nervous/anxious. The patient does not have insomnia.       PAST MEDICAL HISTORY :  Past Medical History:  Diagnosis Date  . Allergic rhinitis   . Anxiety   . Arthritis   . Asthma   . [place]itis [place]   . Colitis    Had blood in his stool with this and treated in the hospital  . Colitis cystica profunda   . Colon polyps   . Complication of anesthesia    when waking up get claustophobic with mask, anxiety, panic  . Depression   . Diverticulitis   . ED (erectile dysfunction)   . Genital herpes   . Hypertension   . Kidney disease   . Lytic lesion of bone on x-ray   . Medical history non-contributory   . Occipital lymphadenopathy   . [place]ipheral vascular disease ([place]   . [place]al cancer ([place]   .  Shortness of breath dyspnea     PAST SURGICAL HISTORY :   Past Surgical History:  Procedure Laterality Date  . APPENDECTOMY  1966  . COLON SURGERY    . IR GASTROSTOMY TUBE REMOVAL  09/02/2017  . KIDNEY SURGERY Right 2012   Nephrectomy  . LOWER EXTREMITY ANGIOGRAPHY Left 08/25/2017   Procedure: LOWER EXTREMITY ANGIOGRAPHY;  Surgeon: Thomas Cabal, MD;   Location: [place] [facility];  Service: Cardiovascular;  Laterality: Left;  . PARTIAL COLECTOMY  2012   For perforated colon related to diverticulitis- Dr. Marina Le  . VASECTOMY  2000  . VIDEO ASSISTED THORACOSCOPY (VATS)/THOROCOTOMY Left 08/13/2015   Procedure: LEFT THOROCOTOMY WITH LEFT UPPER LOBECTOMY, PREOP BRONCHOSCOPY;  Surgeon: Thomas Lewandowsky, MD;  Location: [facility] ORS;  Service: General;  Laterality: Left;    FAMILY HISTORY :   Family History  Problem Relation Age of Onset  . Arthritis/Rheumatoid Unknown        Parent  . Heart disease Unknown        Grandparent  . Alcoholism Unknown        Other relative  . Arthritis Mother        Rhematoid    SOCIAL HISTORY:   Social History   Tobacco Use  . Smoking status: Current Every Day Smoker    Packs/day: 0.25    Years: 30.00    Pack years: 7.50    Types: Cigarettes  . Smokeless tobacco: Never Used  Substance Use Topics  . Alcohol use: Yes    Alcohol/week: 5.0 standard drinks    Types: 5 Cans of beer per week    Comment: 5 beers a night   . Drug use: No    ALLERGIES:  is allergic to contrast media [iodinated diagnostic agents].  MEDICATIONS:  Current Outpatient Medications  Medication Sig Dispense Refill  . acetaminophen (TYLENOL) 500 MG tablet Take 500 mg by mouth every 6 (six) hours as needed for mild pain or headache.     . ADVAIR DISKUS 250-50 MCG/DOSE AEPB Inhale 1 puff into the lungs daily as needed (for shortness of breath or wheezing).   11  . amLODipine (NORVASC) 5 MG tablet Take 5 mg by mouth daily.  1  . aspirin EC 81 MG tablet Take 1 tablet (81 mg total) by mouth daily. 150 tablet 2  . cabozantinib (CABOMETYX) 40 MG tablet Take 1 tablet (40 mg total) by mouth daily. Take on an empty stomach, 1 hour before or 2 hours after meals. 30 tablet 3  . Camphor-Phenol (CAMPHO-PHENIQUE EX) Apply 1 application topically as needed (for pain).    Marland Kitchen clopidogrel (PLAVIX) 75 MG tablet Take 1 tablet (75 mg total) by mouth daily.  30 tablet 4  . meloxicam (MOBIC) 7.5 MG tablet Take 7.5 mg by mouth daily.     . mupirocin nasal ointment (BACTROBAN) 2 % Place 1 application into the nose 2 (two) times daily. Use one-half of tube in each nostril twice daily for five (5) days. After application, press sides of nose together and gently massage. (Patient not taking: Reported on 10/21/2017) 10 g 0  . oxyCODONE (OXY IR/ROXICODONE) 5 MG immediate release tablet Take 1 tablet (5 mg total) by mouth every 12 (twelve) hours as needed for severe pain. 30 tablet 0  . SUNItinib (SUTENT) 25 MG capsule Take 1 capsule (25 mg total) by mouth daily. 2 weeks -ON and 1 week OFF 14 capsule 4   No current facility-administered medications for this visit.  PHYSICAL EXAMINATION: ECOG PERFORMANCE STATUS: 2 - Symptomatic, <50% confined to bed  BP 120/73 (BP Location: Left Arm, Patient Position: Sitting)   Pulse 62   Temp 97.7 F (36.5 C) (Tympanic)   Resp (!) 22   Ht '6\' 1"'  (1.854 m)   Wt 140 lb (63.5 kg)   BMI 18.47 kg/m   Filed Weights   10/21/17 1454  Weight: 140 lb (63.5 kg)    Physical Exam  Constitutional: He is oriented to person, place, and time.  Thin built cachectic appearing male patient.  He is in a wheelchair.  Accompanied by his wife.  HENT:  Head: Normocephalic and atraumatic.  Mouth/Throat: Oropharynx is clear and moist. No oropharyngeal exudate.  Eyes: Pupils are equal, round, and reactive to light.  Neck: Normal range of motion. Neck supple.  Cardiovascular: Normal rate and regular rhythm.  Pulmonary/Chest: No respiratory distress. He has no wheezes.  Decreased breath sounds bilaterally.  Abdominal: Soft. Bowel sounds are normal. He exhibits no distension and no mass. There is no tenderness. There is no rebound and no guarding.  Musculoskeletal: Normal range of motion. He exhibits no edema or tenderness.  Neurological: He is alert and oriented to person, place, and time.  Skin: Skin is warm.  bil Toe ulceration.   Desquamation of the skin; toe ulceration.  Psychiatric: Affect normal.  Depressed mood.  Crying intermittently.       LABORATORY DATA:  I have reviewed the data as listed    Component Value Date/Time   NA 142 10/21/2017 1435   NA 137 12/08/2012 0529   K 4.3 10/21/2017 1435   K 4.0 12/08/2012 0529   CL 109 10/21/2017 1435   CL 106 12/08/2012 0529   CO2 24 10/21/2017 1435   CO2 24 12/08/2012 0529   GLUCOSE 95 10/21/2017 1435   GLUCOSE 76 12/08/2012 0529   BUN 19 10/21/2017 1435   BUN 6 (L) 12/08/2012 0529   CREATININE 0.91 10/21/2017 1435   CREATININE 1.11 12/08/2012 0529   CALCIUM 9.0 10/21/2017 1435   CALCIUM 8.1 (L) 12/08/2012 0529   PROT 7.1 10/21/2017 1435   PROT 6.9 12/06/2012 0901   ALBUMIN 4.0 10/21/2017 1435   ALBUMIN 3.5 12/06/2012 0901   AST 31 10/21/2017 1435   AST 13 (L) 12/06/2012 0901   ALT 23 10/21/2017 1435   ALT 15 12/06/2012 0901   ALKPHOS 59 10/21/2017 1435   ALKPHOS 73 12/06/2012 0901   BILITOT 0.7 10/21/2017 1435   BILITOT 0.8 12/06/2012 0901   GFRNONAA >60 10/21/2017 1435   GFRNONAA >60 12/08/2012 0529   GFRAA >60 10/21/2017 1435   GFRAA >60 12/08/2012 0529    No results found for: SPEP, UPEP  Lab Results  Component Value Date   WBC 7.4 10/21/2017   NEUTROABS 4.5 10/21/2017   HGB 14.2 10/21/2017   HCT 42.9 10/21/2017   MCV 96.7 10/21/2017   PLT 247 10/21/2017      Chemistry      Component Value Date/Time   NA 142 10/21/2017 1435   NA 137 12/08/2012 0529   K 4.3 10/21/2017 1435   K 4.0 12/08/2012 0529   CL 109 10/21/2017 1435   CL 106 12/08/2012 0529   CO2 24 10/21/2017 1435   CO2 24 12/08/2012 0529   BUN 19 10/21/2017 1435   BUN 6 (L) 12/08/2012 0529   CREATININE 0.91 10/21/2017 1435   CREATININE 1.11 12/08/2012 0529      Component Value Date/Time   CALCIUM 9.0 10/21/2017  1435   CALCIUM 8.1 (L) 12/08/2012 0529   ALKPHOS 59 10/21/2017 1435   ALKPHOS 73 12/06/2012 0901   AST 31 10/21/2017 1435   AST 13 (L) 12/06/2012  0901   ALT 23 10/21/2017 1435   ALT 15 12/06/2012 0901   BILITOT 0.7 10/21/2017 1435   BILITOT 0.8 12/06/2012 0901       RADIOGRAPHIC STUDIES: I have personally reviewed the radiological images as listed and agreed with the findings in the report. No results found.   ASSESSMENT & PLAN:  Primary cancer of right kidney with metastasis from kidney to other site [facility]) +#Clear cell kidney cancer; stage IV-clinically stable; on [place] 9m/day [may 14th 2019]. Tolerating well except for  extreme fatigue; July 2019 CT chest and pelvis-STABLE; but see discussion below.   # DISCONTINUE Cabo sec severe pain the feet [see discussion below] ; plan sutent 278m2 weeks on and 1 week OFF.   # Bil Foot pain- hand foot syndrome/ PVD/ scleroderma/ smoking- recommend oxycodone 5 mg BID prn.   # Fatigue- sec to cabo/multifactorial-smoking/COPD/raynauds etc. WORSE   #Brain mets status post resection/hydrocephalus status post shunting-June 2019 brain MRI- STABLE.  # COPD/smoking-stable unfortunately, continues to smoke.  STABLE.   # Depression- new;  defer to Dr.Sonnenberg/  # follow up in 2 weeks/ no labs. Discussed with alysson.   Cc; Dr. SoClaretha Cooper    No orders of the defined types were placed in this encounter.  All questions were answered. The patient knows to call the clinic with any problems, questions or concerns.      GoCammie SickleMD 10/21/2017 4:29 PM

## 2017-10-22 ENCOUNTER — Telehealth: Payer: Self-pay | Admitting: Pharmacist

## 2017-10-22 ENCOUNTER — Telehealth: Payer: Self-pay | Admitting: Pharmacy Technician

## 2017-10-22 DIAGNOSIS — C641 Malignant neoplasm of right kidney, except renal pelvis: Secondary | ICD-10-CM

## 2017-10-22 MED ORDER — SUNITINIB MALATE 25 MG PO CAPS: 25.0000 mg | ORAL_CAPSULE | Freq: Every day | ORAL | 4 refills | Status: DC

## 2017-10-22 NOTE — Telephone Encounter (Signed)
Oral Oncology Pharmacist Encounter  Received new prescription for Sutent (sunitinib) for the treatment of metastatic RCC, planned duration until disease progression or unacceptable drug toxicity.  CMP/CBC from 10/21/17 assessed, no relevant lab abnormalities. Prescription dose and frequency assessed. Patient has been on sunitinib in the past and this was stopped d/t fatigue/poor tolerance. Starting him on a lower dose to hopefully increase tolerance.    Current medication list in Epic reviewed, no relevant DDIs with Sutent identified.  Prescription has been e-scribed to the [facility] for benefits analysis and approval.  Oral Oncology Clinic will continue to follow for insurance authorization, copayment issues, initial counseling and start date.  Darl Pikes, PharmD, BCPS, [facility] Hematology/Oncology Clinical Pharmacist [facility] Oral [place] [facility] [phone]  10/22/2017 2:18 PM

## 2017-10-22 NOTE — Telephone Encounter (Signed)
Oral Oncology Patient Advocate Encounter  Prior Authorization for Sutent has been approved.    PA# [id number] Effective dates: 10/22/2017 through 10/21/2018  Oral Oncology Clinic will continue to follow.   Thomas Le Phone [phone] Fax [phone] 10/22/2017 2:51 PM

## 2017-10-22 NOTE — Telephone Encounter (Signed)
Oral Oncology Patient Advocate Encounter  Received notification from [facility] that prior authorization for Sutent is required.  PA submitted on CoverMyMeds Key [id number] Status is pending  Oral Oncology Clinic will continue to follow.  [place] Patient [place] Phone [phone] Fax [phone] 10/22/2017 2:50 PM

## 2017-10-23 ENCOUNTER — Ambulatory Visit: Payer: BLUE CROSS/BLUE SHIELD | Admitting: Family Medicine

## 2017-10-23 ENCOUNTER — Ambulatory Visit (INDEPENDENT_AMBULATORY_CARE_PROVIDER_SITE_OTHER): Payer: BLUE CROSS/BLUE SHIELD | Admitting: Family Medicine

## 2017-10-23 ENCOUNTER — Encounter: Payer: Self-pay | Admitting: Family Medicine

## 2017-10-23 VITALS — BP 104/78 | HR 58 | Temp 98.1°F | Ht 73.0 in | Wt 138.2 lb

## 2017-10-23 DIAGNOSIS — F32A Depression, unspecified: Secondary | ICD-10-CM

## 2017-10-23 DIAGNOSIS — R29818 Other symptoms and signs involving the nervous system: Secondary | ICD-10-CM | POA: Diagnosis not present

## 2017-10-23 DIAGNOSIS — F419 Anxiety disorder, unspecified: Secondary | ICD-10-CM

## 2017-10-23 DIAGNOSIS — F320 Major depressive disorder, single episode, mild: Secondary | ICD-10-CM

## 2017-10-23 DIAGNOSIS — C641 Malignant neoplasm of right kidney, except renal pelvis: Secondary | ICD-10-CM

## 2017-10-23 DIAGNOSIS — F329 Major depressive disorder, single episode, unspecified: Secondary | ICD-10-CM

## 2017-10-23 DIAGNOSIS — Z982 Presence of cerebrospinal fluid drainage device: Secondary | ICD-10-CM

## 2017-10-23 NOTE — Patient Instructions (Signed)
Nice to see you. We will check on depression medication for you. If you develop confusion, headaches, numbness, weakness, vision changes, nausea, vomiting, balance issues, or any new or changing symptoms please seek medical attention immediately.

## 2017-10-23 NOTE — Assessment & Plan Note (Signed)
Patient is currently following with oncology for this.  He will continue to follow with them.

## 2017-10-23 NOTE — Assessment & Plan Note (Addendum)
This issue has worsened particularly following the death of his father.  He notes no suicidal ideation.  His depression likely accounts for his withdrawn demeanor and flat affect.  Discussed SSRIs though the patient's wife is hesitant to use those given her prior reactions.  They would be willing to trial Serzone though there appears to be an interaction with Advair which he only takes about once a week.  Will check with our clinical pharmacist regarding this.  We will then start him on medication for this.  We will check lab work to ensure there is no other contributing cause for his withdrawn demeanor.  I did discuss given that he has had issues with his shunt in the past having him evaluated in the emergency room so he could have a scan of his head today to ensure that that is not contributing given the change in his demeanor though he and his wife both declined this and understood the potential risks.  I offered to try to get a CT set up as an outpatient for today though they declined it as well and opted to have this done early next week.  CT scan was ordered and I discussed with our referral coordinator getting this set up for early next week.  The patient and his wife are both given return precautions and if he has any persistent or worsening symptoms he should be evaluated in the emergency department.

## 2017-10-23 NOTE — Progress Notes (Addendum)
[name], MD Phone: [phone]  Thomas Le is a 62 y.o. male who presents today for follow-up.  CC: Depression, metastatic renal cell carcinoma, CSF shunt  Patient presents with his wife.  They note he has been struggling for the last several months with depression following his father's death and dealing with all of his own medical issues.  He has had some memory issues over this time with difficulty recalling things.  At times he is not sleeping well.  His wife does note he has been a little more withdrawn and not acting himself compared to previously.  This has been going on for some time though with possible slight worsening of this over the last several days.  He has had no confusion though does have trouble remembering things at times.  No headaches, numbness, weakness, vision changes, breathing issues, nausea, vomiting, or suicidal ideation.  They report no balance issues.  Denies head injury.  They wonder about following up with a different neurosurgeon so that they do not have to travel to [facility].  He does have a shunt in place that has not been evaluated recently though they report he is not having any of the symptoms he had previously when there were issues with the shunt.  He had previously been having urinary incontinence, gait issues, and confusion prior to shunt placement.  He does not have any of those at this time.  Social History   Tobacco Use  Smoking Status Current Every Day Smoker  . Packs/day: 0.25  . Years: 30.00  . Pack years: 7.50  . Types: Cigarettes  Smokeless Tobacco Never Used     ROS see history of present illness  Objective  Physical Exam Vitals:   10/23/17 1507  BP: 104/78  Pulse: (!) 58  Temp: 98.1 F (36.7 C)    BP Readings from Last 3 Encounters:  10/23/17 104/78  10/21/17 120/73  09/28/17 103/68   Wt Readings from Last 3 Encounters:  10/23/17 138 lb 3.2 oz (62.7 kg)  10/21/17 140 lb (63.5 kg)  09/28/17 138 lb 6.4 oz (62.8 kg)      Physical Exam  Constitutional: No distress.  HENT:  Head: Normocephalic and atraumatic.  Mouth/Throat: Oropharynx is clear and moist.  Eyes: Pupils are equal, round, and reactive to light. Conjunctivae are normal.  Cardiovascular: Normal rate, regular rhythm and normal heart sounds.  Pulmonary/Chest: Effort normal and breath sounds normal.  Musculoskeletal: He exhibits no edema.  Neurological: He is alert.  CN 2-12 intact, 5/5 strength in bilateral biceps, triceps, grip, quads, hamstrings, plantar and dorsiflexion, sensation to light touch intact in bilateral UE and LE, gait is slow though steady  Skin: Skin is warm and dry. He is not diaphoretic.  Psychiatric:  Depressed, affect flat and withdrawn, he answers questions appropriately though at times answers are slightly delayed, intermittent eye contact     Assessment/Plan: Please see individual problem list.  Anxiety and depression This issue has worsened particularly following the death of his father.  He notes no suicidal ideation.  His depression likely accounts for his withdrawn demeanor and flat affect.  Discussed SSRIs though the patient's wife is hesitant to use those given her prior reactions.  They would be willing to trial Serzone though there appears to be an interaction with Advair which he only takes about once a week.  Will check with our clinical pharmacist regarding this.  We will then start him on medication for this.  We will check lab work  to ensure there is no other contributing cause for his withdrawn demeanor.  I did discuss given that he has had issues with his shunt in the past having him evaluated in the emergency room so he could have a scan of his head today to ensure that that is not contributing given the change in his demeanor though he and his wife both declined this and understood the potential risks.  I offered to try to get a CT set up as an outpatient for today though they declined it as well and opted to  have this done early next week.  CT scan was ordered and I discussed with our referral coordinator getting this set up for early next week.  The patient and his wife are both given return precautions and if he has any persistent or worsening symptoms he should be evaluated in the emergency department.  Primary cancer of right kidney with metastasis from kidney to other site [facility]) Patient is currently following with oncology for this.  He will continue to follow with them.  History of brain shunt Refer to neurosurgery.   Orders Placed This Encounter  Procedures  . CT Head Wo Contrast    Standing Status:   Future    Standing Expiration Date:   01/24/2019    Order Specific Question:   Preferred imaging location?    Answer:   [facility]    Order Specific Question:   Radiology Contrast Protocol - do NOT remove file path    Answer:   [email or web address]  . CBC w/Diff    Standing Status:   Future    Standing Expiration Date:   10/24/2018  . Comp Met (CMET)    Standing Status:   Future    Standing Expiration Date:   10/24/2018  . TSH    Standing Status:   Future    Standing Expiration Date:   10/24/2018  . Ambulatory referral to Neurosurgery    Referral Priority:   Routine    Referral Type:   Surgical    Referral Reason:   Specialty Services Required    Requested Specialty:   Neurosurgery    Number of Visits Requested:   1    No orders of the defined types were placed in this encounter.    [name], MD [place]

## 2017-10-24 DIAGNOSIS — Z982 Presence of cerebrospinal fluid drainage device: Secondary | ICD-10-CM | POA: Insufficient documentation

## 2017-10-24 NOTE — Assessment & Plan Note (Signed)
Refer to neurosurgery.

## 2017-10-27 ENCOUNTER — Telehealth: Payer: Self-pay | Admitting: Family Medicine

## 2017-10-27 DIAGNOSIS — R29818 Other symptoms and signs involving the nervous system: Secondary | ICD-10-CM

## 2017-10-27 MED ORDER — POLYETHYLENE GLYCOL 3350 17 G PO PACK
17.00 | PACK | ORAL | Status: DC
Start: ? — End: 2017-10-27

## 2017-10-27 MED ORDER — OXYCODONE HCL 5 MG PO TABS
5.00 | ORAL_TABLET | ORAL | Status: DC
Start: ? — End: 2017-10-27

## 2017-10-27 MED ORDER — INSULIN REGULAR HUMAN 100 UNIT/ML IJ SOLN
0.00 | INTRAMUSCULAR | Status: DC
Start: 2017-10-25 — End: 2017-10-27

## 2017-10-27 MED ORDER — ALBUTEROL SULFATE HFA 108 (90 BASE) MCG/ACT IN AERS
2.00 | INHALATION_SPRAY | RESPIRATORY_TRACT | Status: DC
Start: ? — End: 2017-10-27

## 2017-10-27 MED ORDER — MORPHINE SULFATE 2 MG/ML IJ SOLN
2.00 | INTRAMUSCULAR | Status: DC
Start: ? — End: 2017-10-27

## 2017-10-27 MED ORDER — BISACODYL 10 MG RE SUPP
10.00 | RECTAL | Status: DC
Start: ? — End: 2017-10-27

## 2017-10-27 MED ORDER — DOCUSATE SODIUM 100 MG PO CAPS
100.00 | ORAL_CAPSULE | ORAL | Status: DC
Start: 2017-10-25 — End: 2017-10-27

## 2017-10-27 MED ORDER — ACETAMINOPHEN 325 MG PO TABS
650.00 | ORAL_TABLET | ORAL | Status: DC
Start: ? — End: 2017-10-27

## 2017-10-27 MED ORDER — GENERIC EXTERNAL MEDICATION
1.00 | Status: DC
Start: ? — End: 2017-10-27

## 2017-10-27 MED ORDER — LEVETIRACETAM 500 MG PO TABS
1000.00 | ORAL_TABLET | ORAL | Status: DC
Start: 2017-10-25 — End: 2017-10-27

## 2017-10-27 MED ORDER — AMLODIPINE BESYLATE 5 MG PO TABS
5.00 | ORAL_TABLET | ORAL | Status: DC
Start: 2017-10-26 — End: 2017-10-27

## 2017-10-27 NOTE — Telephone Encounter (Signed)
-----   Message from De Hollingshead, [facility] sent at 10/27/2017 11:02 AM EDT ----- Marykay Lex,   Did they ask specifically about nefazodone? I would avoid that medication due to interacting with his sunitinib via CYP3A4.  - Trazodone does not have that concern, if you are targeting help with insomnia. I can't tell if that is a major concern for him. Mirtazapine could also be an option, if weight loss/lack of appetite is a concern for him.  - I would also consider SNRIs such as duloxetine or venlafaxine. I see in his 8/28 hem/onc note that "burning pain in his feet" was mentioned, likely as a result of the cabozantinib. It does not look like neuropathic pain medications are typically used for that side effect (discontinuation of the offending agent is the treatment), but SNRIs may provide some pain benefit? Neither agent has any drug interactions with his current regimen and his renal function is fine.  - I can't tell per note review if anxiety is a major component. If you think he would benefit from a more-activating agent, bupropion does not have any drug interactions. However, I would avoid this one if he is having issues with anxiety. (I'm DEFINITELY not concerned about the fact that he's still smoking, given the progression of his cancer).   Let me know if you have any further questions.   Catie  ----- Message ----- From: Leone Haven, MD Sent: 10/24/2017   2:10 PM EDT To: De Hollingshead, [facility]  Hey Catie,   I saw this patient for depression today. I discussed starting him on an SSRI, though his wife is hesitant for him to take one given her issues with them. They are both willing to trial serzone, though there is an interaction that comes up with his advair regarding prolonged qt interval. He is only using his advair about once a week and thus he might just stop using it, though I wanted to see what you knew about this possible interaction. Thanks.   Randall Hiss

## 2017-10-27 NOTE — Telephone Encounter (Signed)
Patient's wife called back and would like a call back.

## 2017-10-27 NOTE — Telephone Encounter (Signed)
I attempted to contact the patient and his wife to follow-up and see how he was doing after our visit last week. There was no answer or voicemail. Please try to contact them in the morning to see how he is doing after his visit. Please also let them know that the pharmacist advised that serzone was not a good option given one of the other medications that he is on. We could try trazodone or remeron which could help with sleep issues if he is having those. I also wanted to follow-up about getting the CT of his head scheduled. Initially this was scheduled for later this week though it appears that the patient was hospitalized over the weekend for issues with his shunt. He will need to have hospital follow-up scheduled. Please try to arrange this for him. Thanks.

## 2017-10-28 NOTE — Telephone Encounter (Signed)
Dr. Caryl Bis, I have cancelle the CT that was scheduled on 9/6. Please see wife's response below.

## 2017-10-28 NOTE — Telephone Encounter (Signed)
Spoke with patients wife. He is doing better after hospitalization she states shunt was debrided? Spelling. Imaging performed in hospital at [facility].  He would be willing to try medications as long as it would help with depression.

## 2017-10-29 ENCOUNTER — Ambulatory Visit: Payer: BLUE CROSS/BLUE SHIELD

## 2017-10-29 MED ORDER — MIRTAZAPINE 15 MG PO TABS: 15.0000 mg | ORAL_TABLET | Freq: Every day | ORAL | 2 refills | Status: DC

## 2017-10-29 NOTE — Telephone Encounter (Signed)
Oral Chemotherapy Pharmacist Encounter  Patient Education I spoke with patient's wife for overview of new oral chemotherapy medication: Sutent (sunitinib) for the treatment of metastatic RCC, planned duration until disease progression or unacceptable drug toxicity.   Counseled patient on administration, dosing, side effects, monitoring, drug-food interactions, safe handling, storage, and disposal. Patient will take 1 capsule (25 mg total) by mouth daily. Take for 14 days on, then 7 days off. Repeat every 21 days.  They know he is NOT to start his Sutent until instructed to begin by Dr. Rogue Bussing.  Side effects include but not limited to: decreased hgn/wbc/plt, diarrhea, N/V, mouth sores/irratition, increased BP.    Reviewed with patient importance of keeping a medication schedule and plan for any missed doses.  Ms. Goin voiced understanding and appreciation. All questions answered. Medication handout placed in the mail.  Provided patient with Oral [facility] phone number. Patient knows to call the office with questions or concerns. Oral Chemotherapy [facility] will continue to follow.  Darl Pikes, PharmD, BCPS, [facility] Hematology/Oncology Clinical Pharmacist [facility] [phone]  10/29/2017 2:32 PM

## 2017-10-29 NOTE — Addendum Note (Signed)
Addended by: Leone Haven on: 10/29/2017 09:39 AM   Modules accepted: Orders

## 2017-10-29 NOTE — Telephone Encounter (Signed)
Called and instructed patients wife not get prescription for remeron and that it was cancelled at pharmacy.

## 2017-10-29 NOTE — Telephone Encounter (Addendum)
Noted.  I initially sent in remeron, though noted that there was risk for seizures with this after reviewing medication information and patient recently had a seizure related to his brain issues. I contact the pharmacy and spoke with [facility] staff to cancel this prescription. Please advise the patients wife that he should not take this medication if the pharmacy for some reason fills it. I am going to forward to Catie to get her input as it appears there is a warning on several of the medications that were suggested regarding not using in patients with brain damage and seizure history.

## 2017-10-30 ENCOUNTER — Telehealth: Payer: Self-pay | Admitting: *Deleted

## 2017-10-30 ENCOUNTER — Ambulatory Visit: Payer: BLUE CROSS/BLUE SHIELD

## 2017-10-30 NOTE — Telephone Encounter (Signed)
Refill request for Cabometyx received from [facility], It seems that he is now on Sutent. Is this correct?

## 2017-10-30 NOTE — Telephone Encounter (Signed)
Yes, you are correct. I have called [facility] and informed them that he is no longer on Cabometyx.  Darl Pikes, PharmD, BCPS, [facility] Hematology/Oncology Clinical Pharmacist [facility] [phone]  10/30/2017 2:37 PM

## 2017-11-03 ENCOUNTER — Telehealth: Payer: Self-pay

## 2017-11-03 ENCOUNTER — Telehealth: Payer: Self-pay | Admitting: Pharmacy Technician

## 2017-11-03 MED FILL — SUTENT 25 MG CAPSULE: 25 | 21 days supply | Qty: 14 | Fill #0

## 2017-11-03 NOTE — Telephone Encounter (Signed)
Copied from [place] [id number]. Topic: Referral - Question >> Nov 03, 2017  4:48 PM Sheran Luz wrote: Chong Sicilian from [facility] is requesting a call back from referral coordinator regarding pt. Please advise.   [phone]

## 2017-11-03 NOTE — Telephone Encounter (Signed)
Oral Oncology Patient Advocate Encounter  I spoke with Santiago Glad, patients wife, to set up initial delivery of Sutent.  Copay for Sutent is $0.00.    [facility] will mail prescription 11/03/17 to be delivered on 11/04/17.  [place] [facility] [place] Phone [phone] Fax [phone] 11/03/2017 12:56 PM

## 2017-11-06 ENCOUNTER — Other Ambulatory Visit: Payer: Self-pay

## 2017-11-06 ENCOUNTER — Inpatient Hospital Stay: Payer: BLUE CROSS/BLUE SHIELD | Attending: Internal Medicine | Admitting: Internal Medicine

## 2017-11-06 VITALS — BP 120/73 | HR 76 | Temp 96.7°F | Resp 18 | Wt 140.6 lb

## 2017-11-06 DIAGNOSIS — F17210 Nicotine dependence, cigarettes, uncomplicated: Secondary | ICD-10-CM | POA: Insufficient documentation

## 2017-11-06 DIAGNOSIS — R5383 Other fatigue: Secondary | ICD-10-CM

## 2017-11-06 DIAGNOSIS — I739 Peripheral vascular disease, unspecified: Secondary | ICD-10-CM

## 2017-11-06 DIAGNOSIS — C7802 Secondary malignant neoplasm of left lung: Secondary | ICD-10-CM | POA: Diagnosis not present

## 2017-11-06 DIAGNOSIS — C7931 Secondary malignant neoplasm of brain: Secondary | ICD-10-CM | POA: Insufficient documentation

## 2017-11-06 DIAGNOSIS — F418 Other specified anxiety disorders: Secondary | ICD-10-CM | POA: Diagnosis not present

## 2017-11-06 DIAGNOSIS — R0609 Other forms of dyspnea: Secondary | ICD-10-CM | POA: Diagnosis not present

## 2017-11-06 DIAGNOSIS — C641 Malignant neoplasm of right kidney, except renal pelvis: Secondary | ICD-10-CM | POA: Diagnosis not present

## 2017-11-06 DIAGNOSIS — R5381 Other malaise: Secondary | ICD-10-CM | POA: Insufficient documentation

## 2017-11-06 DIAGNOSIS — J449 Chronic obstructive pulmonary disease, unspecified: Secondary | ICD-10-CM | POA: Insufficient documentation

## 2017-11-06 DIAGNOSIS — Z9221 Personal history of antineoplastic chemotherapy: Secondary | ICD-10-CM | POA: Diagnosis not present

## 2017-11-06 DIAGNOSIS — I10 Essential (primary) hypertension: Secondary | ICD-10-CM | POA: Diagnosis not present

## 2017-11-06 DIAGNOSIS — Z7982 Long term (current) use of aspirin: Secondary | ICD-10-CM | POA: Diagnosis not present

## 2017-11-06 DIAGNOSIS — Z79899 Other long term (current) drug therapy: Secondary | ICD-10-CM | POA: Diagnosis not present

## 2017-11-06 DIAGNOSIS — L271 Localized skin eruption due to drugs and medicaments taken internally: Secondary | ICD-10-CM | POA: Insufficient documentation

## 2017-11-06 NOTE — Patient Instructions (Signed)
#   start sutent in 2 weeks from now [so 4 weeks from surgery]  # also call [facility] to check if staples could be removed with [place] at [facility]

## 2017-11-06 NOTE — Progress Notes (Signed)
Pt here for follow up  Per wife on aug 31 pt had a grand mal seizure -called 911 then pt was taken to [facility]- per wife the " shunt had debris in it "-she was told . In hospital 36 h   Was told to take off Plavix and ASA  . No further SZ. Per pt and wife.

## 2017-11-06 NOTE — Progress Notes (Signed)
[facility] OFFICE PROGRESS NOTE  Patient Care Team: Leone Haven, MD as PCP - General (Family Medicine) Cammie Sickle, MD as Medical Oncologist (Medical Oncology)  Cancer Staging No matching staging information was found for the patient.   Oncology History   # JULY 2017 METASTATIC RCC- GOOD RISK [s/p LUL lung section;Dr.Oaks]; RLL- nodule ~48m  # June 2017- ? Frontal lesion on CT [cannot have MRI]  # RIGHT KIDNEY CANCER [incidental 2012; diverticulitis] pT3a (4.3x4.3x 3.2cm) [[facility] clear cell G-2; Neg margins; May 2012 ]  # July 20th- PAZOPANIB 4 pills/day; Discontinued sec to Elevated LFTs.   # OCT 2017- Start Sunitinib 2w-On; 1 w-OFF; CT NOV 13th- RLL- Improved; no new disease. MARCH 7th CT- CR. SUTENT-HOLD [since July 2018-sec to fatigue/poor tol]  # march 2019- Brain met [s/p resection/ s/p VP shunt; Dr.Jaikumar]; Bx of liver lesion- RCC [[facility]]  # May 12th 2019- Cabo 40 mg/day  # ? Scleroderma [Almodipine; Dr.Kernodle]- CONTRAINDICATION to IMMUNOTHERAPY; Severe PVD s/p angio & stenting [Dr.Schneir; July 2019] -----------------------------------    DIAGNOSIS: _0  RCC  STAGE:  IV ; GOALS: pallaitive  CURRENT/MOST RECENT THERAPY _1  Cabo 40 mg [may 14th]      Primary cancer of right kidney with metastasis from kidney to other site ([facility]      INTERVAL HISTORY:  MDECKLYN HYDER89y.o.  male pleasant patient above history of kidney cancer metastatic-currently on  Taken off Cabo-40 mg because of side effects is here for follow-up.  [facility]  Patient was noted to have shunt malfunction.  Patient had revision of the shunt.  He still has staples in place.  Patient continues to feel poorly.  Has not had any seizures since home.  Continues to have leg ulcers.  Shortness of breath on exertion.  Continues to smoke,unfortunately.  Review of Systems  Constitutional: Positive for  malaise/fatigue. Negative for chills, diaphoresis, fever and weight loss.  HENT: Negative for nosebleeds and sore throat.   Eyes: Negative for double vision.  Respiratory: Positive for shortness of breath. Negative for cough, hemoptysis, sputum production and wheezing.   Cardiovascular: Negative for chest pain, palpitations, orthopnea and leg swelling.  Gastrointestinal: Negative for abdominal pain, blood in stool, constipation, diarrhea, heartburn, melena, nausea and vomiting.  Genitourinary: Negative for dysuria, frequency and urgency.  Musculoskeletal: Positive for joint pain.  Skin: Negative.  Negative for itching and rash.  Neurological: Negative for dizziness, tingling, focal weakness, weakness and headaches.  Endo/Heme/Allergies: Does not bruise/bleed easily.  Psychiatric/Behavioral: The patient does not have insomnia.       PAST MEDICAL HISTORY :  Past Medical History:  Diagnosis Date  . Allergic rhinitis   . Anxiety   . Arthritis   . Asthma   . Chronic bronchitis ([place]   . Colitis    Had blood in his stool with this and treated in the hospital  . Colitis cystica profunda   . Colon polyps   . Complication of anesthesia    when waking up get claustophobic with mask, anxiety, panic  . Depression   . Diverticulitis   . ED (erectile dysfunction)   . Genital herpes   . Hypertension   . Kidney disease   . Lytic lesion of bone on x-ray   . Medical history non-contributory   . Occipital lymphadenopathy   . Peripheral vascular disease ([place]   . Renal cancer ([place]   . Shortness of breath dyspnea  PAST SURGICAL HISTORY :   Past Surgical History:  Procedure Laterality Date  . APPENDECTOMY  1966  . COLON SURGERY    . IR GASTROSTOMY TUBE REMOVAL  09/02/2017  . KIDNEY SURGERY Right 2012   Nephrectomy  . LOWER EXTREMITY ANGIOGRAPHY Left 08/25/2017   Procedure: LOWER EXTREMITY ANGIOGRAPHY;  Surgeon: Katha Cabal, MD;  Location: [facility];  Service:  Cardiovascular;  Laterality: Left;  . PARTIAL COLECTOMY  2012   For perforated colon related to diverticulitis- Dr. Marina Gravel  . VASECTOMY  2000  . VIDEO ASSISTED THORACOSCOPY (VATS)/THOROCOTOMY Left 08/13/2015   Procedure: LEFT THOROCOTOMY WITH LEFT UPPER LOBECTOMY, PREOP BRONCHOSCOPY;  Surgeon: Nestor Lewandowsky, MD;  Location: [facility];  Service: General;  Laterality: Left;    FAMILY HISTORY :   Family History  Problem Relation Age of Onset  . Arthritis/Rheumatoid Unknown        Parent  . Heart disease Unknown        Grandparent  . Alcoholism Unknown        Other relative  . Arthritis Mother        Rhematoid    SOCIAL HISTORY:   Social History   Tobacco Use  . Smoking status: Current Every Day Smoker    Packs/day: 0.25    Years: 30.00    Pack years: 7.50    Types: Cigarettes  . Smokeless tobacco: Never Used  Substance Use Topics  . Alcohol use: Yes    Alcohol/week: 5.0 standard drinks    Types: 5 Cans of beer per week    Comment: 5 beers a night   . Drug use: No    ALLERGIES:  is allergic to contrast media [iodinated diagnostic agents].  MEDICATIONS:  Current Outpatient Medications  Medication Sig Dispense Refill  . ADVAIR DISKUS 250-50 MCG/DOSE AEPB Inhale 1 puff into the lungs daily as needed (for shortness of breath or wheezing).   11  . amLODipine (NORVASC) 5 MG tablet Take 5 mg by mouth daily.  1  . Camphor-Phenol (CAMPHO-PHENIQUE EX) Apply 1 application topically as needed (for pain).    . meloxicam (MOBIC) 7.5 MG tablet Take 7.5 mg by mouth daily.     . mupirocin nasal ointment (BACTROBAN) 2 % Place 1 application into the nose 2 (two) times daily. Use one-half of tube in each nostril twice daily for five (5) days. After application, press sides of nose together and gently massage. 10 g 0  . acetaminophen (TYLENOL) 500 MG tablet Take 500 mg by mouth every 6 (six) hours as needed for mild pain or headache.     Marland Kitchen aspirin EC 81 MG tablet Take 1 tablet (81 mg total) by  mouth daily. (Patient not taking: Reported on 11/06/2017) 150 tablet 2  . clopidogrel (PLAVIX) 75 MG tablet Take 1 tablet (75 mg total) by mouth daily. (Patient not taking: Reported on 11/06/2017) 30 tablet 4  . oxyCODONE (OXY IR/ROXICODONE) 5 MG immediate release tablet Take 1 tablet (5 mg total) by mouth every 12 (twelve) hours as needed for severe pain. (Patient not taking: Reported on 11/06/2017) 30 tablet 0  . SUNItinib (SUTENT) 25 MG capsule Take 1 capsule (25 mg total) by mouth daily. Take for 14 days on, then 7 days off. Repeat every 21 days. (Patient not taking: Reported on 11/06/2017) 14 capsule 4   No current facility-administered medications for this visit.     PHYSICAL EXAMINATION: ECOG PERFORMANCE STATUS: 2 - Symptomatic, <50% confined to bed  BP 120/73 (  BP Location: Left Arm, Patient Position: Sitting)   Pulse 76   Temp (!) 96.7 F (35.9 C) (Tympanic)   Resp 18   Wt 140 lb 9.6 oz (63.8 kg)   BMI 18.55 kg/m   Filed Weights   11/06/17 1442  Weight: 140 lb 9.6 oz (63.8 kg)    Physical Exam  Constitutional: He is oriented to person, place, and time.  Thin built cachectic appearing male patient.  He is in a wheelchair.  Accompanied by his wife.  HENT:  Head: Normocephalic and atraumatic.  Mouth/Throat: Oropharynx is clear and moist. No oropharyngeal exudate.  Eyes: Pupils are equal, round, and reactive to light.  Neck: Normal range of motion. Neck supple.  Cardiovascular: Normal rate and regular rhythm.  Pulmonary/Chest: No respiratory distress. He has no wheezes.  Decreased breath sounds bilaterally.  Abdominal: Soft. Bowel sounds are normal. He exhibits no distension and no mass. There is no tenderness. There is no rebound and no guarding.  Musculoskeletal: Normal range of motion. He exhibits no edema or tenderness.  Neurological: He is alert and oriented to person, place, and time.  Skin: Skin is warm.  bil Toe ulceration.  Desquamation of the skin; toe ulceration.   Psychiatric: Affect normal.       LABORATORY DATA:  I have reviewed the data as listed    Component Value Date/Time   NA 142 10/21/2017 1435   NA 137 12/08/2012 0529   K 4.3 10/21/2017 1435   K 4.0 12/08/2012 0529   CL 109 10/21/2017 1435   CL 106 12/08/2012 0529   CO2 24 10/21/2017 1435   CO2 24 12/08/2012 0529   GLUCOSE 95 10/21/2017 1435   GLUCOSE 76 12/08/2012 0529   BUN 19 10/21/2017 1435   BUN 6 (L) 12/08/2012 0529   CREATININE 0.91 10/21/2017 1435   CREATININE 1.11 12/08/2012 0529   CALCIUM 9.0 10/21/2017 1435   CALCIUM 8.1 (L) 12/08/2012 0529   PROT 7.1 10/21/2017 1435   PROT 6.9 12/06/2012 0901   ALBUMIN 4.0 10/21/2017 1435   ALBUMIN 3.5 12/06/2012 0901   AST 31 10/21/2017 1435   AST 13 (L) 12/06/2012 0901   ALT 23 10/21/2017 1435   ALT 15 12/06/2012 0901   ALKPHOS 59 10/21/2017 1435   ALKPHOS 73 12/06/2012 0901   BILITOT 0.7 10/21/2017 1435   BILITOT 0.8 12/06/2012 0901   GFRNONAA >60 10/21/2017 1435   GFRNONAA >60 12/08/2012 0529   GFRAA >60 10/21/2017 1435   GFRAA >60 12/08/2012 0529    No results found for: SPEP, UPEP  Lab Results  Component Value Date   WBC 7.4 10/21/2017   NEUTROABS 4.5 10/21/2017   HGB 14.2 10/21/2017   HCT 42.9 10/21/2017   MCV 96.7 10/21/2017   PLT 247 10/21/2017      Chemistry      Component Value Date/Time   NA 142 10/21/2017 1435   NA 137 12/08/2012 0529   K 4.3 10/21/2017 1435   K 4.0 12/08/2012 0529   CL 109 10/21/2017 1435   CL 106 12/08/2012 0529   CO2 24 10/21/2017 1435   CO2 24 12/08/2012 0529   BUN 19 10/21/2017 1435   BUN 6 (L) 12/08/2012 0529   CREATININE 0.91 10/21/2017 1435   CREATININE 1.11 12/08/2012 0529      Component Value Date/Time   CALCIUM 9.0 10/21/2017 1435   CALCIUM 8.1 (L) 12/08/2012 0529   ALKPHOS 59 10/21/2017 1435   ALKPHOS 73 12/06/2012 0901   AST 31  10/21/2017 1435   AST 13 (L) 12/06/2012 0901   ALT 23 10/21/2017 1435   ALT 15 12/06/2012 0901   BILITOT 0.7 10/21/2017  1435   BILITOT 0.8 12/06/2012 0901       RADIOGRAPHIC STUDIES: I have personally reviewed the radiological images as listed and agreed with the findings in the report. No results found.   ASSESSMENT & PLAN:  Primary cancer of right kidney with metastasis from kidney to other site [facility]) #Clear cell kidney cancer; stage IV-clinically stable; Cabo-on hold because of poor tolerance/bilateral lower extremity wounds/healing issues.  CT chest and pelvis July 2019 stable  #plan to start Sutent 2m 2 weeks on and 1 week OFF.  Medication available.  However given the recent neurosurgery/incision hold Sutent; plan to restart in about 2 weeks [4 weeks from surgery].  Follow-up in 3 weeks  # Bil Foot pain- hand foot syndrome/ PVD/ scleroderma/ smoking-continue oxycodone twice daily as needed.  # COPD/smoking-stable unfortunately, continues to smoke.  Stable  #Staple removal; [facility] of [facility] issues [facility]  [facility].  [facility]  #Follow-up in 3 weeks labs.    Orders Placed This Encounter  Procedures  . CBC with Differential    Standing Status:   Future    Standing Expiration Date:   11/07/2018  . Basic metabolic panel    Standing Status:   Future    Standing Expiration Date:   11/07/2018   All questions were answered. The patient knows to call the clinic with any problems, questions or concerns.      GCammie Sickle MD 11/06/2017 5:04 PM

## 2017-11-06 NOTE — Assessment & Plan Note (Addendum)
#  Clear cell kidney cancer; stage IV-clinically stable; Cabo-on hold because of poor tolerance/bilateral lower extremity wounds/healing issues.  CT chest and pelvis July 2019 stable  #plan to start Sutent 25mg  2 weeks on and 1 week OFF.  Medication available.  However given the recent neurosurgery/incision hold Sutent; plan to restart in about 2 weeks [4 weeks from surgery].  Follow-up in 3 weeks  # Bil Foot pain- hand foot syndrome/ PVD/ scleroderma/ smoking-continue oxycodone twice daily as needed.  # COPD/smoking-stable unfortunately, continues to smoke.  Stable  #Staple removal; patient because of social issues having difficulty having the staples [facility].  [facility].  I have also asked the patient to call [facility] regarding the same.  #Follow-up in 3 weeks labs.

## 2017-11-13 ENCOUNTER — Telehealth: Payer: Self-pay | Admitting: *Deleted

## 2017-11-13 NOTE — Telephone Encounter (Signed)
Wife called and left a vm for RN to return her phone call. States that she contacted [facility] and spoke with nursing team at [facility]surgery. [facility] agreed [facility] remove staples. She wanted to make an apt for this.  I reviewed chart - note on care everywhere at [facility].  Telephone Encounter - Jeralyn Bennett, RN - 11/13/2017 11:13 AM EDT I called the wife Thomas Le) and told her it would be fine if someone at [facility] removed the sutures/staples as long as the wound continues to look good. I assured her we would cancel appointment for Monday.    Dr. Jacinto Reap may I schedule - staple removal on Monday.

## 2017-11-16 ENCOUNTER — Other Ambulatory Visit: Payer: Self-pay | Admitting: *Deleted

## 2017-11-16 ENCOUNTER — Inpatient Hospital Stay: Payer: BLUE CROSS/BLUE SHIELD | Admitting: *Deleted

## 2017-11-16 DIAGNOSIS — Z4802 Encounter for removal of sutures: Secondary | ICD-10-CM

## 2017-11-16 NOTE — Telephone Encounter (Signed)
md - patient may come on Monday for staple removal. Contacted pt on Friday. 9/20. Pt given a nurse apt for staple removal at 300 pm on Monday 9/23

## 2017-11-16 NOTE — Progress Notes (Signed)
Patient presents to clinic for nursing visit only for staples removal. Skin intact-right lateral scalp. No signs of infection. Staples removed per hospital policy. steristrips applied to scalp. Patient discharged by RN.

## 2017-11-17 ENCOUNTER — Other Ambulatory Visit (INDEPENDENT_AMBULATORY_CARE_PROVIDER_SITE_OTHER): Payer: Self-pay | Admitting: Vascular Surgery

## 2017-11-17 NOTE — Telephone Encounter (Signed)
We can call it in please

## 2017-11-27 ENCOUNTER — Other Ambulatory Visit: Payer: Self-pay

## 2017-11-27 ENCOUNTER — Encounter: Payer: Self-pay | Admitting: Internal Medicine

## 2017-11-27 ENCOUNTER — Inpatient Hospital Stay: Payer: BLUE CROSS/BLUE SHIELD | Attending: Internal Medicine | Admitting: Internal Medicine

## 2017-11-27 ENCOUNTER — Inpatient Hospital Stay: Payer: BLUE CROSS/BLUE SHIELD

## 2017-11-27 VITALS — BP 108/66 | HR 69 | Temp 97.6°F | Resp 20 | Ht 73.0 in | Wt 137.0 lb

## 2017-11-27 DIAGNOSIS — R569 Unspecified convulsions: Secondary | ICD-10-CM | POA: Diagnosis not present

## 2017-11-27 DIAGNOSIS — C787 Secondary malignant neoplasm of liver and intrahepatic bile duct: Secondary | ICD-10-CM

## 2017-11-27 DIAGNOSIS — R918 Other nonspecific abnormal finding of lung field: Secondary | ICD-10-CM | POA: Insufficient documentation

## 2017-11-27 DIAGNOSIS — M79671 Pain in right foot: Secondary | ICD-10-CM | POA: Diagnosis not present

## 2017-11-27 DIAGNOSIS — Z9221 Personal history of antineoplastic chemotherapy: Secondary | ICD-10-CM

## 2017-11-27 DIAGNOSIS — Z23 Encounter for immunization: Secondary | ICD-10-CM | POA: Insufficient documentation

## 2017-11-27 DIAGNOSIS — Z7982 Long term (current) use of aspirin: Secondary | ICD-10-CM | POA: Insufficient documentation

## 2017-11-27 DIAGNOSIS — C641 Malignant neoplasm of right kidney, except renal pelvis: Secondary | ICD-10-CM | POA: Diagnosis not present

## 2017-11-27 DIAGNOSIS — Z79899 Other long term (current) drug therapy: Secondary | ICD-10-CM | POA: Insufficient documentation

## 2017-11-27 DIAGNOSIS — J449 Chronic obstructive pulmonary disease, unspecified: Secondary | ICD-10-CM | POA: Insufficient documentation

## 2017-11-27 DIAGNOSIS — M79672 Pain in left foot: Secondary | ICD-10-CM

## 2017-11-27 DIAGNOSIS — I10 Essential (primary) hypertension: Secondary | ICD-10-CM | POA: Diagnosis not present

## 2017-11-27 DIAGNOSIS — F17210 Nicotine dependence, cigarettes, uncomplicated: Secondary | ICD-10-CM | POA: Diagnosis not present

## 2017-11-27 LAB — CBC WITH DIFFERENTIAL/PLATELET
Basophils Absolute: 0.1 10*3/uL (ref 0–0.1)
Basophils Relative: 1 %
EOS PCT: 3 %
Eosinophils Absolute: 0.2 10*3/uL (ref 0–0.7)
HCT: 37.8 % — ABNORMAL LOW (ref 40.0–52.0)
Hemoglobin: 12.8 g/dL — ABNORMAL LOW (ref 13.0–18.0)
LYMPHS PCT: 24 %
Lymphs Abs: 2 10*3/uL (ref 1.0–3.6)
MCH: 34.1 pg — AB (ref 26.0–34.0)
MCHC: 33.9 g/dL (ref 32.0–36.0)
MCV: 100.6 fL — ABNORMAL HIGH (ref 80.0–100.0)
MONO ABS: 0.8 10*3/uL (ref 0.2–1.0)
MONOS PCT: 9 %
Neutro Abs: 5.6 10*3/uL (ref 1.4–6.5)
Neutrophils Relative %: 63 %
PLATELETS: 261 10*3/uL (ref 150–440)
RBC: 3.75 MIL/uL — AB (ref 4.40–5.90)
RDW: 15 % — AB (ref 11.5–14.5)
WBC: 8.7 10*3/uL (ref 3.8–10.6)

## 2017-11-27 LAB — BASIC METABOLIC PANEL
Anion gap: 8 (ref 5–15)
BUN: 28 mg/dL — AB (ref 8–23)
CHLORIDE: 104 mmol/L (ref 98–111)
CO2: 26 mmol/L (ref 22–32)
Calcium: 9.2 mg/dL (ref 8.9–10.3)
Creatinine, Ser: 1 mg/dL (ref 0.61–1.24)
GFR calc Af Amer: >60 mL/min (ref >60)
GFR calc non Af Amer: >60 mL/min (ref >60)
GLUCOSE: 139 mg/dL — AB (ref 70–99)
POTASSIUM: 4.2 mmol/L (ref 3.5–5.1)
Sodium: 138 mmol/L (ref 135–145)

## 2017-11-27 NOTE — Progress Notes (Signed)
[place] OFFICE PROGRESS NOTE  Patient Care Team: Leone Haven, MD as PCP - General (Family Medicine) Cammie Sickle, MD as Medical Oncologist (Medical Oncology)  Cancer Staging No matching staging information was found for the patient.   Oncology History   # JULY 2017 METASTATIC RCC- GOOD RISK [s/p LUL lung section;Dr.Oaks]; RLL- nodule ~72m  # June 2017- ? Frontal lesion on CT [cannot have MRI]  # RIGHT KIDNEY CANCER [incidental 2012; diverticulitis] pT3a (4.3x4.3x 3.2cm) [[facility] clear cell G-2; Neg margins; May 2012 ]  # July 20th- PAZOPANIB 4 pills/day; Discontinued sec to Elevated LFTs.   # OCT 2017- Start Sunitinib 2w-On; 1 w-OFF; CT NOV 13th- RLL- Improved; no new disease. MARCH 7th CT- CR. SUTENT-HOLD [since July 2018-sec to fatigue/poor tol]  # march 2019- Brain met [s/p resection/ s/p VP shunt; Dr.Jaikumar]; Bx of liver lesion- RCC [[facility]]  # May 12th 2019- Cabo 40 mg/day; SEP 2019- STOPPED SEC to intol [fatigue/legs ulcers]  # OCT 4th 2019- Re-Start Sutent 25 mg- 2w/1w  # ? Scleroderma [Almodipine; Dr.Kernodle]- CONTRAINDICATION to IMMUNOTHERAPY; Severe PVD s/p angio & stenting [Dr.Schneir; July 2019] -----------------------------------    DIAGNOSIS: _0  RCC  STAGE:  IV ; GOALS: pallaitive  CURRENT/MOST RECENT THERAPY [ Oct 4th 2019- Sutent]     Primary cancer of right kidney with metastasis from kidney to other site ([facility]      INTERVAL HISTORY:  Thomas [id number].o.  male pleasant patient above history of kidney cancer metastatic-most recently on Cabo-40 mg.  Patient is currently off medication given poor tolerance/extreme fatigue/and also non-healing ulcers of his toes and legs.  In the interim patient had his staples removed office scalp.  Patient denies any seizures.  Continues to have nonhealing wounds of the leg/ulcers.  Overall improving.  Continues to have chronic shortness of breath chronic cough.   Review of Systems   Constitutional: Positive for malaise/fatigue. Negative for chills, diaphoresis, fever and weight loss.  HENT: Negative for nosebleeds and sore throat.   Eyes: Negative for double vision.  Respiratory: Positive for shortness of breath. Negative for cough, hemoptysis, sputum production and wheezing.   Cardiovascular: Negative for chest pain, palpitations, orthopnea and leg swelling.  Gastrointestinal: Negative for abdominal pain, blood in stool, constipation, diarrhea, heartburn, melena, nausea and vomiting.  Genitourinary: Negative for dysuria, frequency and urgency.  Musculoskeletal: Positive for joint pain.  Skin: Negative.  Negative for itching and rash.  Neurological: Negative for dizziness, tingling, focal weakness, weakness and headaches.  Endo/Heme/Allergies: Does not bruise/bleed easily.  Psychiatric/Behavioral: The patient does not have insomnia.       PAST MEDICAL HISTORY :  Past Medical History:  Diagnosis Date  . Allergic rhinitis   . Anxiety   . Arthritis   . Asthma   . Chronic bronchitis ([place]   . Colitis    Had blood in his stool with this and treated in the hospital  . Colitis cystica profunda   . Colon polyps   . Complication of anesthesia    when waking up get claustophobic with mask, anxiety, panic  . Depression   . Diverticulitis   . ED (erectile dysfunction)   . Genital herpes   . Hypertension   . Kidney disease   . Lytic lesion of bone on x-ray   . Medical history non-contributory   . Occipital lymphadenopathy   . Peripheral vascular disease ([place]   . Renal cancer ([place]   . Shortness of breath dyspnea  PAST SURGICAL HISTORY :   Past Surgical History:  Procedure Laterality Date  . APPENDECTOMY  1966  . COLON SURGERY    . IR GASTROSTOMY TUBE REMOVAL  09/02/2017  . KIDNEY SURGERY Right 2012   Nephrectomy  . LOWER EXTREMITY ANGIOGRAPHY Left 08/25/2017   Procedure: LOWER EXTREMITY ANGIOGRAPHY;  Surgeon: Katha Cabal, MD;  Location: [place] [facility];  Service: Cardiovascular;  Laterality: Left;  . PARTIAL COLECTOMY  2012   For perforated colon related to diverticulitis- Dr. Marina Gravel  . VASECTOMY  2000  . VIDEO ASSISTED THORACOSCOPY (VATS)/THOROCOTOMY Left 08/13/2015   Procedure: LEFT THOROCOTOMY WITH LEFT UPPER LOBECTOMY, PREOP BRONCHOSCOPY;  Surgeon: Nestor Lewandowsky, MD;  Location: [facility] OR[facility];  Service: General;  Laterality: Left;    FAMILY HISTORY :   Family History  Problem Relation Age of Onset  . Arthritis/Rheumatoid Unknown        Parent  . Heart disease Unknown        Grandparent  . Alcoholism Unknown        Other relative  . Arthritis Mother        Rhematoid    SOCIAL HISTORY:   Social History   Tobacco Use  . Smoking status: Current Every Day Smoker    Packs/day: 0.25    Years: 30.00    Pack years: 7.50    Types: Cigarettes  . Smokeless tobacco: Never Used  Substance Use Topics  . Alcohol use: Yes    Alcohol/week: 5.0 standard drinks    Types: 5 Cans of beer per week    Comment: 5 beers a night   . Drug use: No    ALLERGIES:  is allergic to contrast media [iodinated diagnostic agents].  MEDICATIONS:  Current Outpatient Medications  Medication Sig Dispense Refill  . acetaminophen (TYLENOL) 500 MG tablet Take 500 mg by mouth every 6 (six) hours as needed for mild pain or headache.     . ADVAIR DISKUS 250-50 MCG/DOSE AEPB Inhale 1 puff into the lungs daily as needed (for shortness of breath or wheezing).   11  . amLODipine (NORVASC) 5 MG tablet Take 5 mg by mouth daily.  1  . Camphor-Phenol (CAMPHO-PHENIQUE EX) Apply 1 application topically as needed (for pain).    Marland Kitchen clopidogrel (PLAVIX) 75 MG tablet TAKE 1 TABLET BY MOUTH EVERY DAY 90 tablet 1  . meloxicam (MOBIC) 7.5 MG tablet Take 7.5 mg by mouth daily.     . mupirocin nasal ointment (BACTROBAN) 2 % Place 1 application into the nose 2 (two) times daily. Use one-half of tube in each nostril twice daily for five (5) days. After application, press  sides of nose together and gently massage. 10 g 0  . oxyCODONE (OXY IR/ROXICODONE) 5 MG immediate release tablet Take 1 tablet (5 mg total) by mouth every 12 (twelve) hours as needed for severe pain. 30 tablet 0  . aspirin EC 81 MG tablet Take 1 tablet (81 mg total) by mouth daily. (Patient not taking: Reported on 11/06/2017) 150 tablet 2  . SUNItinib (SUTENT) 25 MG capsule Take 1 capsule (25 mg total) by mouth daily. Take for 14 days on, then 7 days off. Repeat every 21 days. (Patient not taking: Reported on 11/06/2017) 14 capsule 4   No current facility-administered medications for this visit.     PHYSICAL EXAMINATION: ECOG PERFORMANCE STATUS: 2 - Symptomatic, <50% confined to bed  BP 108/66 (Patient Position: Sitting)   Pulse 69   Temp 97.6 F (36.4 C) (  Tympanic)   Resp 20   Ht _0  (1.854 m)   Wt 137 lb (62.1 kg)   BMI 18.07 kg/m   Filed Weights   11/27/17 1502  Weight: 137 lb (62.1 kg)    Physical Exam  Constitutional: He is oriented to person, place, and time.  Thin built cachectic appearing male patient.  He is walking by himself.  Accompanied by his wife.  HENT:  Head: Normocephalic and atraumatic.  Mouth/Throat: Oropharynx is clear and moist. No oropharyngeal exudate.  Eyes: Pupils are equal, round, and reactive to light.  Neck: Normal range of motion. Neck supple.  Cardiovascular: Normal rate and regular rhythm.  Pulmonary/Chest: No respiratory distress. He has no wheezes.  Decreased breath sounds bilaterally.  Abdominal: Soft. Bowel sounds are normal. He exhibits no distension and no mass. There is no tenderness. There is no rebound and no guarding.  Musculoskeletal: Normal range of motion. He exhibits no edema or tenderness.  Neurological: He is alert and oriented to person, place, and time.  Skin: Skin is warm.  bil Toe ulceration.  Desquamation of the skin; toe ulceration.  Psychiatric: Affect normal.       LABORATORY DATA:  I have reviewed the data as  listed    Component Value Date/Time   NA 138 11/27/2017 1447   NA 137 12/08/2012 0529   K 4.2 11/27/2017 1447   K 4.0 12/08/2012 0529   CL 104 11/27/2017 1447   CL 106 12/08/2012 0529   CO2 26 11/27/2017 1447   CO2 24 12/08/2012 0529   GLUCOSE 139 (H) 11/27/2017 1447   GLUCOSE 76 12/08/2012 0529   BUN 28 (H) 11/27/2017 1447   BUN 6 (L) 12/08/2012 0529   CREATININE 1.00 11/27/2017 1447   CREATININE 1.11 12/08/2012 0529   CALCIUM 9.2 11/27/2017 1447   CALCIUM 8.1 (L) 12/08/2012 0529   PROT 7.1 10/21/2017 1435   PROT 6.9 12/06/2012 0901   ALBUMIN 4.0 10/21/2017 1435   ALBUMIN 3.5 12/06/2012 0901   AST 31 10/21/2017 1435   AST 13 (L) 12/06/2012 0901   ALT 23 10/21/2017 1435   ALT 15 12/06/2012 0901   ALKPHOS 59 10/21/2017 1435   ALKPHOS 73 12/06/2012 0901   BILITOT 0.7 10/21/2017 1435   BILITOT 0.8 12/06/2012 0901   GFRNONAA >60 11/27/2017 1447   GFRNONAA >60 12/08/2012 0529   GFRAA >60 11/27/2017 1447   GFRAA >60 12/08/2012 0529    No results found for: SPEP, UPEP  Lab Results  Component Value Date   WBC 8.7 11/27/2017   NEUTROABS 5.6 11/27/2017   HGB 12.8 (L) 11/27/2017   HCT 37.8 (L) 11/27/2017   MCV 100.6 (H) 11/27/2017   PLT 261 11/27/2017      Chemistry      Component Value Date/Time   NA 138 11/27/2017 1447   NA 137 12/08/2012 0529   K 4.2 11/27/2017 1447   K 4.0 12/08/2012 0529   CL 104 11/27/2017 1447   CL 106 12/08/2012 0529   CO2 26 11/27/2017 1447   CO2 24 12/08/2012 0529   BUN 28 (H) 11/27/2017 1447   BUN 6 (L) 12/08/2012 0529   CREATININE 1.00 11/27/2017 1447   CREATININE 1.11 12/08/2012 0529      Component Value Date/Time   CALCIUM 9.2 11/27/2017 1447   CALCIUM 8.1 (L) 12/08/2012 0529   ALKPHOS 59 10/21/2017 1435   ALKPHOS 73 12/06/2012 0901   AST 31 10/21/2017 1435   AST 13 (L) 12/06/2012 0901  ALT 23 10/21/2017 1435   ALT 15 12/06/2012 0901   BILITOT 0.7 10/21/2017 1435   BILITOT 0.8 12/06/2012 0901       RADIOGRAPHIC  STUDIES: I have personally reviewed the radiological images as listed and agreed with the findings in the report. No results found.   ASSESSMENT & PLAN:  Primary cancer of right kidney with metastasis from kidney to other site [facility]) #Clear cell kidney cancer; stage IV-clinically stable; July 2019 CT chest and pelvis-small lung nodules; liver metastases.  Clinically stable. # Plan to start Sutent 32m 2 weeks on and 1 week OFF-medications available.  # Bil Foot pain- hand foot syndrome/ PVD/ scleroderma/ smoking-continue oxycodone twice daily as needed.  Stable.  Recommend to quit smoking.  # COPD/smoking-stable unfortunately, continues to smoke.  Stable.  # Follow-up in 3 weeks labs.    Orders Placed This Encounter  Procedures  . CBC with Differential    Standing Status:   Future    Standing Expiration Date:   11/28/2018  . Comprehensive metabolic panel    Standing Status:   Future    Standing Expiration Date:   11/28/2018   All questions were answered. The patient knows to call the clinic with any problems, questions or concerns.      GCammie Sickle MD 11/29/2017 4:25 PM

## 2017-11-27 NOTE — Assessment & Plan Note (Addendum)
#  Clear cell kidney cancer; stage IV-clinically stable; July 2019 CT chest and pelvis-small lung nodules; liver metastases.  Clinically stable. # Plan to start Sutent 25mg  2 weeks on and 1 week OFF-medications available.  # Bil Foot pain- hand foot syndrome/ PVD/ scleroderma/ smoking-continue oxycodone twice daily as needed.  Stable.  Recommend to quit smoking.  # COPD/smoking-stable unfortunately, continues to smoke.  Stable.  # Follow-up in 3 weeks labs.

## 2017-12-01 ENCOUNTER — Encounter: Payer: Self-pay | Admitting: Family Medicine

## 2017-12-01 ENCOUNTER — Ambulatory Visit (INDEPENDENT_AMBULATORY_CARE_PROVIDER_SITE_OTHER): Payer: BLUE CROSS/BLUE SHIELD | Admitting: Family Medicine

## 2017-12-01 DIAGNOSIS — F419 Anxiety disorder, unspecified: Secondary | ICD-10-CM | POA: Diagnosis not present

## 2017-12-01 DIAGNOSIS — Z982 Presence of cerebrospinal fluid drainage device: Secondary | ICD-10-CM

## 2017-12-01 DIAGNOSIS — R7989 Other specified abnormal findings of blood chemistry: Secondary | ICD-10-CM

## 2017-12-01 DIAGNOSIS — F32A Depression, unspecified: Secondary | ICD-10-CM

## 2017-12-01 DIAGNOSIS — G479 Sleep disorder, unspecified: Secondary | ICD-10-CM

## 2017-12-01 DIAGNOSIS — R778 Other specified abnormalities of plasma proteins: Secondary | ICD-10-CM

## 2017-12-01 DIAGNOSIS — F329 Major depressive disorder, single episode, unspecified: Secondary | ICD-10-CM

## 2017-12-01 NOTE — Patient Instructions (Addendum)
Nice to see you. We will get you to see cardiology. Please see your neurosurgeon as planned and have your CT scan done. If you develop severe headaches, vision changes, confusion, or any new or changing symptoms please seek medical attention immediately. Please ask your surgeon if it would be okay for you to get a flu shot.

## 2017-12-03 DIAGNOSIS — R778 Other specified abnormalities of plasma proteins: Secondary | ICD-10-CM | POA: Insufficient documentation

## 2017-12-03 DIAGNOSIS — R7989 Other specified abnormal findings of blood chemistry: Secondary | ICD-10-CM | POA: Insufficient documentation

## 2017-12-03 DIAGNOSIS — G479 Sleep disorder, unspecified: Secondary | ICD-10-CM | POA: Insufficient documentation

## 2017-12-03 NOTE — Progress Notes (Signed)
[name], MD Phone: [phone]  Thomas Le is a 62 y.o. male who presents today for f/u.  CC: shunt malfunction, elevated troponin, sleep difficulty  Patient was previously seen in our office and noted to have had cognitive changes.  Subsequently had a seizure that required hospitalization for shunt malfunction.  Seizure was the result of the shunt malfunction.  He was admitted to [facility] for this and underwent surgery and was discharged the next day.  He was back to his baseline at time of discharge.  He notes he continues to feel well overall.  He was not discharged on any seizure medication.  He has noted minimal headache though no severe headaches.  No vision changes.  No new numbness.  No weakness.  He notes his depression is a little bit better and he notes no SI.  His troponin was noted to be elevated in the hospital though he had no chest pain.  His shortness of breath is chronic and stable.  It appears cardiology recommended outpatient work-up for this.  Patient notes is not been sleeping all that well.  Typically goes to bed between 9 and 11 PM and gets up around 2 AM.  Is not taking anything for sleep.  He follows up with his neurosurgeon this coming Monday.  Social History   Tobacco Use  Smoking Status Current Every Day Smoker  . Packs/day: 0.25  . Years: 30.00  . Pack years: 7.50  . Types: Cigarettes  Smokeless Tobacco Never Used     ROS see history of present illness  Objective  Physical Exam Vitals:   12/01/17 1553  BP: (!) 110/58  Pulse: 60  Temp: 98 F (36.7 C)    BP Readings from Last 3 Encounters:  12/01/17 (!) 110/58  11/27/17 108/66  11/06/17 120/73   Wt Readings from Last 3 Encounters:  12/01/17 138 lb (62.6 kg)  11/27/17 137 lb (62.1 kg)  11/06/17 140 lb 9.6 oz (63.8 kg)    Physical Exam  Constitutional: No distress.  Cardiovascular: Normal rate, regular rhythm and normal heart sounds.  Pulmonary/Chest: Effort normal and breath sounds  normal.  Musculoskeletal: He exhibits no edema.  Neurological: He is alert.  CN 2-12 intact, 5/5 strength in bilateral biceps, triceps, grip, quads, hamstrings, plantar and dorsiflexion, sensation to light touch intact in bilateral UE and LE  Skin: Skin is warm and dry. He is not diaphoretic.     Assessment/Plan: Please see individual problem list.  Anxiety and depression Improved to some degree.  No SI.  Discussed having him see his neurosurgeon in follow-up first and then we could consider medication for depression.  History of brain shunt Patient with prior shunt malfunction resulting in seizure and hospitalization.  He underwent repair of this and has done well since then.  He remains at his baseline.  He will see his neurosurgeon this coming week and discuss further management of this issue with them.  He is given return precautions.  Sleeping difficulty This may be related to his depression.  It may be related to his chronic illnesses as well.  I suggested that a discuss the potential for using melatonin with his neurosurgeon prior to starting anything.  Elevated troponin Noted during recent hospitalization.  Trended down.  Undetermined cause.  He has been asymptomatic.  We will refer to cardiology for further evaluation.  Patient did note intermittent crackling and is here with some postnasal drip and rhinorrhea.  TMs appeared normal as does his oropharynx.  Discussed monitoring this for now and once he sees his neurosurgeon could consider medication such as Claritin.  Advised him to discuss the Claritin with his neurosurgeon.  Orders Placed This Encounter  Procedures  . Ambulatory referral to Cardiology    Referral Priority:   Routine    Referral Type:   Consultation    Referral Reason:   Specialty Services Required    Requested Specialty:   Cardiology    Number of Visits Requested:   1    No orders of the defined types were placed in this encounter.    [name],  MD [place]

## 2017-12-03 NOTE — Assessment & Plan Note (Signed)
This may be related to his depression.  It may be related to his chronic illnesses as well.  I suggested that a discuss the potential for using melatonin with his neurosurgeon prior to starting anything.

## 2017-12-03 NOTE — Assessment & Plan Note (Signed)
Patient with prior shunt malfunction resulting in seizure and hospitalization.  He underwent repair of this and has done well since then.  He remains at his baseline.  He will see his neurosurgeon this coming week and discuss further management of this issue with them.  He is given return precautions.

## 2017-12-03 NOTE — Assessment & Plan Note (Signed)
Improved to some degree.  No SI.  Discussed having him see his neurosurgeon in follow-up first and then we could consider medication for depression.

## 2017-12-03 NOTE — Assessment & Plan Note (Addendum)
Noted during recent hospitalization.  Trended down.  Undetermined cause.  He has been asymptomatic.  We will refer to cardiology for further evaluation.

## 2017-12-17 ENCOUNTER — Other Ambulatory Visit: Payer: Self-pay

## 2017-12-17 ENCOUNTER — Emergency Department
Admission: EM | Admit: 2017-12-17 | Discharge: 2017-12-17 | Disposition: A | Discharge status: Home / Self Care | Payer: BLUE CROSS/BLUE SHIELD | Attending: Emergency Medicine | Admitting: Emergency Medicine

## 2017-12-17 ENCOUNTER — Emergency Department: Payer: BLUE CROSS/BLUE SHIELD

## 2017-12-17 DIAGNOSIS — F17210 Nicotine dependence, cigarettes, uncomplicated: Secondary | ICD-10-CM | POA: Diagnosis not present

## 2017-12-17 DIAGNOSIS — R569 Unspecified convulsions: Secondary | ICD-10-CM | POA: Insufficient documentation

## 2017-12-17 DIAGNOSIS — J449 Chronic obstructive pulmonary disease, unspecified: Secondary | ICD-10-CM | POA: Insufficient documentation

## 2017-12-17 DIAGNOSIS — Z982 Presence of cerebrospinal fluid drainage device: Secondary | ICD-10-CM | POA: Insufficient documentation

## 2017-12-17 DIAGNOSIS — I10 Essential (primary) hypertension: Secondary | ICD-10-CM | POA: Insufficient documentation

## 2017-12-17 DIAGNOSIS — Z79899 Other long term (current) drug therapy: Secondary | ICD-10-CM | POA: Insufficient documentation

## 2017-12-17 DIAGNOSIS — C7931 Secondary malignant neoplasm of brain: Secondary | ICD-10-CM | POA: Insufficient documentation

## 2017-12-17 LAB — CBC
HCT: 43.4 % (ref 39.0–52.0)
HEMOGLOBIN: 13.7 g/dL (ref 13.0–17.0)
MCH: 32.9 pg (ref 26.0–34.0)
MCHC: 31.6 g/dL (ref 30.0–36.0)
MCV: 104.1 fL — ABNORMAL HIGH (ref 80.0–100.0)
PLATELETS: 297 10*3/uL (ref 150–400)
RBC: 4.17 MIL/uL — AB (ref 4.22–5.81)
RDW: 13.6 % (ref 11.5–15.5)
WBC: 8.4 10*3/uL (ref 4.0–10.5)
nRBC: 0 % (ref 0.0–0.2)

## 2017-12-17 LAB — BASIC METABOLIC PANEL
ANION GAP: 11 (ref 5–15)
BUN: 24 mg/dL — ABNORMAL HIGH (ref 8–23)
CALCIUM: 9.3 mg/dL (ref 8.9–10.3)
CO2: 23 mmol/L (ref 22–32)
Chloride: 105 mmol/L (ref 98–111)
Creatinine, Ser: 1.01 mg/dL (ref 0.61–1.24)
Glucose, Bld: 114 mg/dL — ABNORMAL HIGH (ref 70–99)
Potassium: 4.2 mmol/L (ref 3.5–5.1)
SODIUM: 139 mmol/L (ref 135–145)

## 2017-12-17 MED ORDER — LEVETIRACETAM 1000 MG PO TABS: 1000.0000 mg | ORAL_TABLET | Freq: Two times a day (BID) | ORAL | 0 refills | Status: DC

## 2017-12-17 MED ORDER — LEVETIRACETAM IN NACL 1000 MG/100ML IV SOLN
1000.0000 mg | Freq: Once | INTRAVENOUS | Status: AC
  Administered 2017-12-17: 1000 mg via INTRAVENOUS
  Filled 2017-12-17: qty 100

## 2017-12-17 NOTE — ED Triage Notes (Signed)
Pt comes into the ED via EMS from home after having a seizure at home this morning per wife. Pt has stage 4 brain CA with a shunt,. States the last time he had a seizure was due to the shunt being clogged up.. Pt is alert on arrival with some confusion. Pt is alert to self at present.

## 2017-12-17 NOTE — ED Provider Notes (Signed)
[facility] Emergency Department Provider Note ____________________________________________   I have reviewed the triage vital signs and the triage nursing note.  HISTORY  Chief Complaint Seizures   Historian Patient and son Patient poor historian secondary to brain cancer  HPI Thomas Le is a 62 y.o. male with history of RCC metastatic to brain s/p right craniotomy for resection of third ventricular mass (03/11/2017), which was complicated by hydrocephalus s/p R parieto-occipital shunt placement (03/25/2017). He has since had two additional presentations concerning for shunt failure and hydrocephalus requiring VP shunt revisions, once in 04/2017 and most-recently on 10/24/17 for his 3rd right VP shunt surgery. His Sophysa valve is still set at 30 mm H2O. ---- This is per chart review from neurosurgery appt 10/14  Son states he was woken up by patients wife due to patient convulsing.  Jaw clenched, tight, loud breathing.  Lasted approx 3-4 minutes and extinguished on own once EMS arrived.  Back to MS baseline now, which is some confusion.  Reports prior seizure in August when found to need shunt revision per son.  Currently patient denies headache.  Denies recent illnesses.  Denies nausea.  No reported vomiting.  No reported fevers.  Does not take medication for seizures.   Past Medical History:  Diagnosis Date  . Allergic rhinitis   . Anxiety   . Arthritis   . Asthma   . Chronic bronchitis ([place])   . Colitis    Had blood in his stool with this and treated in the hospital  . Colitis cystica profunda   . Colon polyps   . Complication of anesthesia    when waking up get claustophobic with mask, anxiety, panic  . Depression   . Diverticulitis   . ED (erectile dysfunction)   . Genital herpes   . Hypertension   . Kidney disease   . Lytic lesion of bone on x-ray   . Medical history non-contributory   . Occipital lymphadenopathy   . Peripheral  vascular disease ([place])   . Renal cancer ([place])   . Shortness of breath dyspnea     Patient Active Problem List   Diagnosis Date Noted  . Sleeping difficulty 12/03/2017  . Elevated troponin 12/03/2017  . History of brain shunt 10/24/2017  . Atherosclerosis of native arteries of the extremities with ulceration ([place]) 10/07/2017  . COPD (chronic obstructive pulmonary disease) ([place]) 10/07/2017  . Skin ulcer of toe of right foot with fat layer exposed ([place]) 08/10/2017  . Tobacco abuse 07/07/2017  . Goals of care, counseling/discussion 07/05/2017  . Allergic rhinitis 11/15/2015  . Primary cancer of right kidney with metastasis from kidney to other site [facility]) 08/30/2015  . Lung mass 08/13/2015  . Erectile dysfunction 06/14/2015  . Occipital lymphadenopathy 06/14/2015  . Anxiety and depression 05/14/2015  . Bilateral hand pain 05/14/2015  . Scleroderma ([place]) 05/14/2015  . Diarrhea 05/14/2015    Past Surgical History:  Procedure Laterality Date  . APPENDECTOMY  1966  . COLON SURGERY    . IR GASTROSTOMY TUBE REMOVAL  09/02/2017  . KIDNEY SURGERY Right 2012   Nephrectomy  . LOWER EXTREMITY ANGIOGRAPHY Left 08/25/2017   Procedure: LOWER EXTREMITY ANGIOGRAPHY;  Surgeon: Katha Cabal, MD;  Location: [place] [facility];  Service: Cardiovascular;  Laterality: Left;  . PARTIAL COLECTOMY  2012   For perforated colon related to diverticulitis- Dr. Marina Gravel  . VASECTOMY  2000  . VIDEO ASSISTED THORACOSCOPY (VATS)/THOROCOTOMY Left 08/13/2015   Procedure: LEFT THOROCOTOMY WITH LEFT  UPPER LOBECTOMY, PREOP BRONCHOSCOPY;  Surgeon: Nestor Lewandowsky, MD;  Location: [facility] ORS;  Service: General;  Laterality: Left;    Prior to Admission medications   Medication Sig Start Date End Date Taking? Authorizing Provider  ADVAIR DISKUS 250-50 MCG/DOSE AEPB Inhale 1 puff into the lungs daily as needed (for shortness of breath or wheezing).  07/17/17  Yes [provider]  amLODipine (NORVASC) 5 MG tablet Take  5 mg by mouth daily. 08/18/17  Yes [provider]  Camphor-Phenol (CAMPHO-PHENIQUE EX) Apply 1 application topically as needed (for pain).   Yes [provider]  ibuprofen (ADVIL,MOTRIN) 200 MG tablet Take 400-800 mg by mouth every 6 (six) hours as needed for mild pain or moderate pain.   Yes [provider]  meloxicam (MOBIC) 7.5 MG tablet Take 7.5 mg by mouth daily.    Yes [provider]  oxyCODONE (OXY IR/ROXICODONE) 5 MG immediate release tablet Take 1 tablet (5 mg total) by mouth every 12 (twelve) hours as needed for severe pain. 10/21/17  Yes Cammie Sickle, MD  SUNItinib (SUTENT) 25 MG capsule Take 1 capsule (25 mg total) by mouth daily. Take for 14 days on, then 7 days off. Repeat every 21 days. 10/22/17  Yes Cammie Sickle, MD  levETIRAcetam (KEPPRA) 1000 MG tablet Take 1 tablet (1,000 mg total) by mouth 2 (two) times daily. 12/17/17   Lisa Roca, MD  mupirocin nasal ointment (BACTROBAN) 2 % Place 1 application into the nose 2 (two) times daily. Use one-half of tube in each nostril twice daily for five (5) days. After application, press sides of nose together and gently massage. Patient not taking: Reported on 12/17/2017 08/19/17   Cammie Sickle, MD    Allergies  Allergen Reactions  . Contrast Media [Iodinated Diagnostic Agents] Nausea And Vomiting    Family History  Problem Relation Age of Onset  . Arthritis/Rheumatoid Unknown        Parent  . Heart disease Unknown        Grandparent  . Alcoholism Unknown        Other relative  . Arthritis Mother        Rhematoid    Social History Social History   Tobacco Use  . Smoking status: Current Every Day Smoker    Packs/day: 0.25    Years: 30.00    Pack years: 7.50    Types: Cigarettes  . Smokeless tobacco: Never Used  Substance Use Topics  . Alcohol use: Yes    Alcohol/week: 5.0 standard drinks    Types: 5 Cans of beer per week    Comment: 5 beers a night   . Drug  use: No    Review of Systems  Constitutional: Negative for fever. Eyes: Negative for visual changes. ENT: Negative for sore throat. Cardiovascular: Negative for chest pain. Respiratory: Negative for shortness of breath. Gastrointestinal: Negative for abdominal pain, vomiting and diarrhea. Genitourinary: Negative for dysuria. Musculoskeletal: Negative for back pain. Skin: Negative for rash. Neurological: Negative for headache.  ____________________________________________   PHYSICAL EXAM:  VITAL SIGNS: ED Triage Vitals  Enc Vitals Group     BP 12/17/17 1041 127/77     Pulse Rate 12/17/17 1041 72     Resp 12/17/17 1041 18     Temp 12/17/17 1041 98.2 F (36.8 C)     Temp Source 12/17/17 1041 Oral     SpO2 --      Weight 12/17/17 1042 155 lb (70.3 kg)  Height 12/17/17 1042 6\' 2"  (1.88 m)     Head Circumference --      Peak Flow --      Pain Score 12/17/17 1042 0     Pain Loc --      Pain Edu? --      Excl. in [place]? --      Constitutional: Alert and cooperative, somewhat confused at baseline.  HEENT      Head: Normocephalic and atraumatic.      Eyes: Conjunctivae are normal. Pupils equal and round.       Ears:         Nose: No congestion/rhinnorhea.      Mouth/Throat: Mucous membranes are moist.      Neck: No stridor. Cardiovascular/Chest: Normal rate, regular rhythm.  No murmurs, rubs, or gallops. Respiratory: Normal respiratory effort without tachypnea nor retractions. Breath sounds are clear and equal bilaterally. No wheezes/rales/rhonchi. Gastrointestinal: Soft. No distention, no guarding, no rebound. Nontender.    Genitourinary/rectal:Deferred Musculoskeletal: Nontender with normal range of motion in all extremities. No joint effusions.  No lower extremity tenderness.  No edema. Neurologic: No slurred speech.  No facial droop.  Some confusion.   No gross or focal neurologic deficits are appreciated. Skin:  Skin is warm, dry and intact. No rash  noted. Psychiatric: Calm and cooperative.   ____________________________________________  LABS (pertinent positives/negatives) I, Lisa Roca, MD the attending physician have reviewed the labs noted below.  Labs Reviewed  BASIC METABOLIC PANEL - Abnormal; Notable for the following components:      Result Value   Glucose, Bld 114 (*)    BUN 24 (*)    All other components within normal limits  CBC - Abnormal; Notable for the following components:   RBC 4.17 (*)    MCV 104.1 (*)    All other components within normal limits  CBG MONITORING, ED    ____________________________________________    EKG I, Lisa Roca, MD, the attending physician have personally viewed and interpreted all ECGs.  72 beats per minute.  Normal sinus rhythm.   normal axis.  Nonspecific intraventricular conduction delay.  Nonspecific ST-T wave ____________________________________________  RADIOLOGY   CT without contrast, radiologist report:  IMPRESSION: Shunt catheter in place coursing across the midline into the left lateral ventricle anteriorly. This is a significant change when compared with the prior MRI. The lateral ventricles are now decompressed.  Postsurgical changes are noted stable from the previous exam.  No acute abnormality is noted. __________________________________________  PROCEDURES  Procedure(s) performed: None  Procedures  Critical Care performed: None   ____________________________________________  ED COURSE / ASSESSMENT AND PLAN  Pertinent labs & imaging results that were available during my care of the patient were reviewed by me and considered in my medical decision making (see chart for details).    At baseline mental status, which is somewhat confused, according to son who is here at the bedside.  Sounds like this was infected general tonic-clonic seizure at that extreme shortness own in about 3 to 4 minutes.  In review of prior notes from neurosurgery,  he had one seizure previously which was in association with shunt malfunction and hydrocephalus which was surgically repaired.  CT head today does not show hydrocephalus.  Patient is not having other signs of shunt malfunction.  Discussed with [facility] neurology on-call with respect to antiepileptic recommendation, Dr. Prince Rome who reviewed patient's [facility] records which indicate patient had previous [place] been on [place] but had been tapered down as an outpatient  and by the neurosurgery report from last week was off of that.  He does recommend starting back on Keppra 1000 mg twice daily.     CONSULTATIONS:   [facility] neurology Dr. Prince Rome.   Patient / Family / Caregiver informed of clinical course, medical decision-making process, and agree with plan.   I discussed return precautions, follow-up instructions, and discharge instructions with patient and/or family.  Discharge Instructions:   You are evaluated after seizure today and started back on Keppra for seizures.  Return to the emergency department immediately for any additional seizure, any fever, any headache, any nausea or vomiting, any confusion altered mental status, any new weakness or numbness, or any other symptoms concerning to you.    ___________________________________________   FINAL CLINICAL IMPRESSION(S) / ED DIAGNOSES   Final diagnoses:  Seizure ([place])      ___________________________________________         Note: This dictation was prepared with Dragon dictation. Any transcriptional errors that result from this process are unintentional    Lisa Roca, MD 12/17/17 1328

## 2017-12-17 NOTE — ED Notes (Signed)
Patient transported to CT 

## 2017-12-17 NOTE — Discharge Instructions (Signed)
You are evaluated after seizure today and started back on Keppra for seizures.  Return to the emergency department immediately for any additional seizure, any fever, any headache, any nausea or vomiting, any confusion altered mental status, any new weakness or numbness, or any other symptoms concerning to you.

## 2017-12-18 ENCOUNTER — Inpatient Hospital Stay: Payer: BLUE CROSS/BLUE SHIELD

## 2017-12-18 ENCOUNTER — Other Ambulatory Visit: Payer: Self-pay

## 2017-12-18 ENCOUNTER — Inpatient Hospital Stay (HOSPITAL_BASED_OUTPATIENT_CLINIC_OR_DEPARTMENT_OTHER): Payer: BLUE CROSS/BLUE SHIELD | Admitting: Internal Medicine

## 2017-12-18 VITALS — BP 134/74 | HR 54 | Temp 95.6°F | Resp 18 | Wt 139.2 lb

## 2017-12-18 DIAGNOSIS — R918 Other nonspecific abnormal finding of lung field: Secondary | ICD-10-CM | POA: Diagnosis not present

## 2017-12-18 DIAGNOSIS — Z9221 Personal history of antineoplastic chemotherapy: Secondary | ICD-10-CM

## 2017-12-18 DIAGNOSIS — F17210 Nicotine dependence, cigarettes, uncomplicated: Secondary | ICD-10-CM

## 2017-12-18 DIAGNOSIS — C787 Secondary malignant neoplasm of liver and intrahepatic bile duct: Secondary | ICD-10-CM

## 2017-12-18 DIAGNOSIS — C641 Malignant neoplasm of right kidney, except renal pelvis: Secondary | ICD-10-CM

## 2017-12-18 DIAGNOSIS — Z7982 Long term (current) use of aspirin: Secondary | ICD-10-CM

## 2017-12-18 DIAGNOSIS — R569 Unspecified convulsions: Secondary | ICD-10-CM

## 2017-12-18 DIAGNOSIS — Z23 Encounter for immunization: Secondary | ICD-10-CM

## 2017-12-18 DIAGNOSIS — J449 Chronic obstructive pulmonary disease, unspecified: Secondary | ICD-10-CM

## 2017-12-18 DIAGNOSIS — Z79899 Other long term (current) drug therapy: Secondary | ICD-10-CM

## 2017-12-18 DIAGNOSIS — M79672 Pain in left foot: Secondary | ICD-10-CM

## 2017-12-18 DIAGNOSIS — I10 Essential (primary) hypertension: Secondary | ICD-10-CM

## 2017-12-18 DIAGNOSIS — M79671 Pain in right foot: Secondary | ICD-10-CM

## 2017-12-18 LAB — COMPREHENSIVE METABOLIC PANEL
ALT: 12 U/L (ref 0–44)
AST: 20 U/L (ref 15–41)
Albumin: 4 g/dL (ref 3.5–5.0)
Alkaline Phosphatase: 63 U/L (ref 38–126)
Anion gap: 6 (ref 5–15)
BUN: 20 mg/dL (ref 8–23)
CHLORIDE: 104 mmol/L (ref 98–111)
CO2: 27 mmol/L (ref 22–32)
CREATININE: 0.84 mg/dL (ref 0.61–1.24)
Calcium: 9.4 mg/dL (ref 8.9–10.3)
Glucose, Bld: 98 mg/dL (ref 70–99)
Potassium: 4.5 mmol/L (ref 3.5–5.1)
Sodium: 137 mmol/L (ref 135–145)
TOTAL PROTEIN: 7.5 g/dL (ref 6.5–8.1)
Total Bilirubin: 0.7 mg/dL (ref 0.3–1.2)

## 2017-12-18 LAB — CBC WITH DIFFERENTIAL/PLATELET
Abs Immature Granulocytes: 0.02 10*3/uL (ref 0.00–0.07)
BASOS ABS: 0 10*3/uL (ref 0.0–0.1)
BASOS PCT: 0 %
EOS ABS: 0.4 10*3/uL (ref 0.0–0.5)
Eosinophils Relative: 4 %
HCT: 42.8 % (ref 39.0–52.0)
HEMOGLOBIN: 13.4 g/dL (ref 13.0–17.0)
Immature Granulocytes: 0 %
LYMPHS PCT: 32 %
Lymphs Abs: 2.8 10*3/uL (ref 0.7–4.0)
MCH: 32 pg (ref 26.0–34.0)
MCHC: 31.3 g/dL (ref 30.0–36.0)
MCV: 102.1 fL — ABNORMAL HIGH (ref 80.0–100.0)
Monocytes Absolute: 0.6 10*3/uL (ref 0.1–1.0)
Monocytes Relative: 6 %
NRBC: 0 % (ref 0.0–0.2)
Neutro Abs: 5.1 10*3/uL (ref 1.7–7.7)
Neutrophils Relative %: 58 %
PLATELETS: 253 10*3/uL (ref 150–400)
RBC: 4.19 MIL/uL — AB (ref 4.22–5.81)
RDW: 13.5 % (ref 11.5–15.5)
WBC: 9 10*3/uL (ref 4.0–10.5)

## 2017-12-18 MED ORDER — INFLUENZA VAC SPLIT QUAD 0.5 ML IM SUSY
0.5000 mL | PREFILLED_SYRINGE | Freq: Once | INTRAMUSCULAR | Status: AC
  Administered 2017-12-18: 0.5 mL via INTRAMUSCULAR
  Filled 2017-12-18: qty 0.5

## 2017-12-18 MED FILL — SUTENT 25 MG CAPSULE: 25 | 21 days supply | Qty: 14 | Fill #1

## 2017-12-18 NOTE — Progress Notes (Signed)
Here for follow up. Per wife pt had " seizure" yesterday-EMS took pt to ER in ER.  Monitored in ER  Keppra started per wife. Has cardiology appt next Monday per wife.

## 2017-12-18 NOTE — Progress Notes (Signed)
[facility] OFFICE PROGRESS NOTE  Patient Care Team: Leone Haven, MD as PCP - General (Family Medicine) Cammie Sickle, MD as Medical Oncologist (Medical Oncology)  Cancer Staging No matching staging information was found for the patient.   Oncology History   # JULY 2017 METASTATIC RCC- GOOD RISK [s/p LUL lung section;Dr.Oaks]; RLL- nodule ~42m  # June 2017- ? Frontal lesion on CT [cannot have MRI]  # RIGHT KIDNEY CANCER [incidental 2012; diverticulitis] pT3a (4.3x4.3x 3.2cm) [[facility] clear cell G-2; Neg margins; May 2012 ]  # July 20th- PAZOPANIB 4 pills/day; Discontinued sec to Elevated LFTs.   # OCT 2017- Start Sunitinib 2w-On; 1 w-OFF; CT NOV 13th- RLL- Improved; no new disease. MARCH 7th CT- CR. SUTENT-HOLD [since July 2018-sec to fatigue/poor tol]  # march 2019- Brain met [s/p resection/ s/p VP shunt; Dr.Jaikumar]; Bx of liver lesion- RCC [[facility]]  # May 12th 2019- [place] 40 mg/day; SEP 2019- STOPPED SEC to intol [fatigue/legs ulcers]  # OCT 4th 2019- Re-Start Sutent 25 mg- 2w/1w  # ? Scleroderma [Almodipine; Dr.Kernodle]- CONTRAINDICATION to IMMUNOTHERAPY; Severe PVD s/p angio & stenting [Dr.Schneir; July 2019] -----------------------------------    DIAGNOSIS: _0  RCC  STAGE:  IV ; GOALS: pallaitive  CURRENT/MOST RECENT THERAPY [ Oct 4th 2019- Sutent]     Primary cancer of right kidney with metastasis from kidney to other site ([facility]      INTERVAL HISTORY:  Thomas MERRIWEATHER89y.o.  male pleasant patient above history of kidney cancer metastatic-currently on Sutent is here for follow-up.  In the interim patient had a seizure-was seen the emergency room started on Keppra.  Noncontrast CT scan showed no acute process.  [facility]  However patient wants neurology closer to home.  Continues to have good healing of his bilateral lower extremity ulcers.  Continue chronic shortness of breath  chronic cough.  Review of Systems  Constitutional: Positive for malaise/fatigue. Negative for chills, diaphoresis, fever and weight loss.  HENT: Negative for nosebleeds and sore throat.   Eyes: Negative for double vision.  Respiratory: Positive for shortness of breath. Negative for cough, hemoptysis, sputum production and wheezing.   Cardiovascular: Negative for chest pain, palpitations, orthopnea and leg swelling.  Gastrointestinal: Negative for abdominal pain, blood in stool, constipation, diarrhea, heartburn, melena, nausea and vomiting.  Genitourinary: Negative for dysuria, frequency and urgency.  Musculoskeletal: Positive for joint pain.  Skin: Negative.  Negative for itching and rash.  Neurological: Negative for dizziness, tingling, focal weakness, weakness and headaches.  Endo/Heme/Allergies: Does not bruise/bleed easily.  Psychiatric/Behavioral: The patient does not have insomnia.       PAST MEDICAL HISTORY :  Past Medical History:  Diagnosis Date  . Allergic rhinitis   . Anxiety   . Arthritis   . Asthma   . Chronic bronchitis ([place]   . Colitis    Had blood in his stool with this and treated in the hospital  . Colitis cystica profunda   . Colon polyps   . Complication of anesthesia    when waking up get claustophobic with mask, anxiety, panic  . Depression   . Diverticulitis   . ED (erectile dysfunction)   . Genital herpes   . Hypertension   . Kidney disease   . Lytic lesion of bone on x-ray   . Medical history non-contributory   . Occipital lymphadenopathy   . Peripheral vascular disease ([place]   . Renal cancer ([place]   .  Shortness of breath dyspnea     PAST SURGICAL HISTORY :   Past Surgical History:  Procedure Laterality Date  . APPENDECTOMY  1966  . COLON SURGERY    . IR GASTROSTOMY TUBE REMOVAL  09/02/2017  . KIDNEY SURGERY Right 2012   Nephrectomy  . LOWER EXTREMITY ANGIOGRAPHY Left 08/25/2017   Procedure: LOWER EXTREMITY ANGIOGRAPHY;  Surgeon: Katha Cabal, MD;  Location: [place] [facility];  Service: Cardiovascular;  Laterality: Left;  . PARTIAL COLECTOMY  2012   For perforated colon related to diverticulitis- Dr. Marina Gravel  . VASECTOMY  2000  . VIDEO ASSISTED THORACOSCOPY (VATS)/THOROCOTOMY Left 08/13/2015   Procedure: LEFT THOROCOTOMY WITH LEFT UPPER LOBECTOMY, PREOP BRONCHOSCOPY;  Surgeon: Nestor Lewandowsky, MD;  Location: [facility] ORS;  Service: General;  Laterality: Left;    FAMILY HISTORY :   Family History  Problem Relation Age of Onset  . Arthritis/Rheumatoid Unknown        Parent  . Heart disease Unknown        Grandparent  . Alcoholism Unknown        Other relative  . Arthritis Mother        Rhematoid    SOCIAL HISTORY:   Social History   Tobacco Use  . Smoking status: Current Every Day Smoker    Packs/day: 0.25    Years: 30.00    Pack years: 7.50    Types: Cigarettes  . Smokeless tobacco: Never Used  Substance Use Topics  . Alcohol use: Yes    Alcohol/week: 5.0 standard drinks    Types: 5 Cans of beer per week    Comment: 5 beers a night   . Drug use: No    ALLERGIES:  is allergic to contrast media [iodinated diagnostic agents].  MEDICATIONS:  Current Outpatient Medications  Medication Sig Dispense Refill  . amLODipine (NORVASC) 5 MG tablet Take 5 mg by mouth daily.  1  . levETIRAcetam (KEPPRA) 1000 MG tablet Take 1 tablet (1,000 mg total) by mouth 2 (two) times daily. 60 tablet 0  . meloxicam (MOBIC) 7.5 MG tablet Take 7.5 mg by mouth daily.     Marland Kitchen oxyCODONE (OXY IR/ROXICODONE) 5 MG immediate release tablet Take 1 tablet (5 mg total) by mouth every 12 (twelve) hours as needed for severe pain. 30 tablet 0  . ADVAIR DISKUS 250-50 MCG/DOSE AEPB Inhale 1 puff into the lungs daily as needed (for shortness of breath or wheezing).   11  . Camphor-Phenol (CAMPHO-PHENIQUE EX) Apply 1 application topically as needed (for pain).    Marland Kitchen ibuprofen (ADVIL,MOTRIN) 200 MG tablet Take 400-800 mg by mouth every 6 (six) hours as  needed for mild pain or moderate pain.    . mupirocin nasal ointment (BACTROBAN) 2 % Place 1 application into the nose 2 (two) times daily. Use one-half of tube in each nostril twice daily for five (5) days. After application, press sides of nose together and gently massage. (Patient not taking: Reported on 12/17/2017) 10 g 0  . SUNItinib (SUTENT) 25 MG capsule Take 1 capsule (25 mg total) by mouth daily. Take for 14 days on, then 7 days off. Repeat every 21 days. (Patient not taking: Reported on 12/18/2017) 14 capsule 4   No current facility-administered medications for this visit.     PHYSICAL EXAMINATION: ECOG PERFORMANCE STATUS: 2 - Symptomatic, <50% confined to bed  BP 134/74 (BP Location: Left Arm, Patient Position: Sitting)   Pulse (!) 54   Temp (!) 95.6 F (35.3 C) (  Tympanic)   Resp 18   Wt 139 lb 3.2 oz (63.1 kg)   BMI 17.87 kg/m   Filed Weights   12/18/17 1451  Weight: 139 lb 3.2 oz (63.1 kg)    Physical Exam  Constitutional: He is oriented to person, place, and time.  Thin built cachectic appearing male patient.  He is walking by himself.  Accompanied by his wife.  HENT:  Head: Normocephalic and atraumatic.  Mouth/Throat: Oropharynx is clear and moist. No oropharyngeal exudate.  Eyes: Pupils are equal, round, and reactive to light.  Neck: Normal range of motion. Neck supple.  Cardiovascular: Normal rate and regular rhythm.  Pulmonary/Chest: No respiratory distress. He has no wheezes.  Decreased breath sounds bilaterally.  Abdominal: Soft. Bowel sounds are normal. He exhibits no distension and no mass. There is no tenderness. There is no rebound and no guarding.  Musculoskeletal: Normal range of motion. He exhibits no edema or tenderness.  Neurological: He is alert and oriented to person, place, and time.  Skin: Skin is warm.  bil Toe ulceration.  Desquamation of the skin; toe ulceration.  Psychiatric: Affect normal.       LABORATORY DATA:  I have reviewed the  data as listed    Component Value Date/Time   NA 137 12/18/2017 1421   NA 137 12/08/2012 0529   K 4.5 12/18/2017 1421   K 4.0 12/08/2012 0529   CL 104 12/18/2017 1421   CL 106 12/08/2012 0529   CO2 27 12/18/2017 1421   CO2 24 12/08/2012 0529   GLUCOSE 98 12/18/2017 1421   GLUCOSE 76 12/08/2012 0529   BUN 20 12/18/2017 1421   BUN 6 (L) 12/08/2012 0529   CREATININE 0.84 12/18/2017 1421   CREATININE 1.11 12/08/2012 0529   CALCIUM 9.4 12/18/2017 1421   CALCIUM 8.1 (L) 12/08/2012 0529   PROT 7.5 12/18/2017 1421   PROT 6.9 12/06/2012 0901   ALBUMIN 4.0 12/18/2017 1421   ALBUMIN 3.5 12/06/2012 0901   AST 20 12/18/2017 1421   AST 13 (L) 12/06/2012 0901   ALT 12 12/18/2017 1421   ALT 15 12/06/2012 0901   ALKPHOS 63 12/18/2017 1421   ALKPHOS 73 12/06/2012 0901   BILITOT 0.7 12/18/2017 1421   BILITOT 0.8 12/06/2012 0901   GFRNONAA >60 12/18/2017 1421   GFRNONAA >60 12/08/2012 0529   GFRAA >60 12/18/2017 1421   GFRAA >60 12/08/2012 0529    No results found for: SPEP, UPEP  Lab Results  Component Value Date   WBC 9.0 12/18/2017   NEUTROABS 5.1 12/18/2017   HGB 13.4 12/18/2017   HCT 42.8 12/18/2017   MCV 102.1 (H) 12/18/2017   PLT 253 12/18/2017      Chemistry      Component Value Date/Time   NA 137 12/18/2017 1421   NA 137 12/08/2012 0529   K 4.5 12/18/2017 1421   K 4.0 12/08/2012 0529   CL 104 12/18/2017 1421   CL 106 12/08/2012 0529   CO2 27 12/18/2017 1421   CO2 24 12/08/2012 0529   BUN 20 12/18/2017 1421   BUN 6 (L) 12/08/2012 0529   CREATININE 0.84 12/18/2017 1421   CREATININE 1.11 12/08/2012 0529      Component Value Date/Time   CALCIUM 9.4 12/18/2017 1421   CALCIUM 8.1 (L) 12/08/2012 0529   ALKPHOS 63 12/18/2017 1421   ALKPHOS 73 12/06/2012 0901   AST 20 12/18/2017 1421   AST 13 (L) 12/06/2012 0901   ALT 12 12/18/2017 1421   ALT  15 12/06/2012 0901   BILITOT 0.7 12/18/2017 1421   BILITOT 0.8 12/06/2012 0901       RADIOGRAPHIC STUDIES: I have  personally reviewed the radiological images as listed and agreed with the findings in the report. No results found.   ASSESSMENT & PLAN:  Primary cancer of right kidney with metastasis from kidney to other site [facility]) #Clear cell kidney cancer; stage IV-clinically stable; July 2019 CT C/P-small lung nodules; liver metastases.  Stable  #Multiple interruptions given intolerance/hospitalizations.  Continue Sutent 77m 2 weeks on and 1 week OFF;   # Bil Foot pain- hand foot syndrome/ PVD/ scleroderma/ smoking-continue oxycodone twice daily as needed.  Stable  # Seizures- 10/24- CT brain-NEG for acute process. on Keppra; refer to neurology locally./Patient preference.  # COPD/smoking-stable unfortunately, continues to smoke.  Stable  # Disposition: # refer to Neurology asap Dx; Seizures; flu shot today.  # Follow up with MD/labs-cbc/cmp- 3 week-dr.B   Orders Placed This Encounter  Procedures  . Ambulatory referral to Neurology    Referral Priority:   Routine    Referral Type:   Consultation    Referral Reason:   Specialty Services Required    Requested Specialty:   Neurology    Number of Visits Requested:   1   All questions were answered. The patient knows to call the clinic with any problems, questions or concerns.      GCammie Sickle MD 12/20/2017 4:04 PM

## 2017-12-18 NOTE — Assessment & Plan Note (Addendum)
#  Clear cell kidney cancer; stage IV-clinically stable; July 2019 CT C/P-small lung nodules; liver metastases.  Stable  #Multiple interruptions given intolerance/hospitalizations.  Continue Sutent 25mg  2 weeks on and 1 week OFF;   # Bil Foot pain- hand foot syndrome/ PVD/ scleroderma/ smoking-continue oxycodone twice daily as needed.  Stable  # Seizures- 10/24- CT brain-NEG for acute process. on Keppra; refer to neurology locally./Patient preference.  # COPD/smoking-stable unfortunately, continues to smoke.  Stable  # Disposition: # refer to Neurology asap Dx; Seizures; flu shot today.  # Follow up with MD/labs-cbc/cmp- 3 week-dr.B

## 2017-12-20 NOTE — Progress Notes (Signed)
Cardiology Office Note  Date:  12/21/2017   ID:  Thomas, Le 1956/01/16, MRN [id number]  PCP:  Glori Luis, MD   Chief Complaint  Patient presents with  . OTHER    Elevated Tropononin c/o seeing white with new seizure medication. Meds reviewed verbally with pt.    HPI:  Mr. Thomas Le is a 62 year old gentleman with past medical history of Seizures Smoker, COPD PAD Cancer of kidney with metastases, stage IV Small lung nodules, liver metastasis History of lung resection left lower lobe CSF shunt, malfunction, [place] Referred by Dr. De Nurse for consultation of his elevated troponin  Presents today with his wife He was relatively nonverbal for most of his office visit Currently office chemotherapy per oncology notes secondary to poor tolerance, fatigue, nonhealing ulcers toes and legs  Working closely with vascular surgery for nonhealing lower extremity ulcers  Chronic shortness of breath, chronic cough  CT scan chest July 2019 Severe three vessel calciufication Images reviewed, pulled up in the office and discussed with him today  Prior records reviewed in detail with patient and family Mildly elevated cardiac enzymes at [facility] May 06, 2017 Initial troponin 0.2, Follow-up troponin 0.6, then 0.1 Was admitted at the time for malfunctioning ventriculoperitoneal shunt Prior resection of third ventricle mass January 2019 complicated by hydrocephalus requiring right parieto-occipital shunt placement He presented to the hospital with mental status changes, nausea secondary to shunt malfunction and hydrocephalus Taken to the emergency room October 24, 2017 for shunt revision No reported EKG changes Reports indicating he did well during the surgery and postoperatively  Recent seizures this past week Started on keppra in the ER, thousand milligrams twice daily Wife very concerned about side effects Depressed more, crying more? He has cut the dose down, but  100 once a day  EKG personally reviewed by myself on todays visit Shows normal sinus rhythm rate 64 bpm, unable to exclude old anteroseptal MI   PMH:   has a past medical history of Allergic rhinitis, Anxiety, Arthritis, Asthma, Chronic bronchitis (HCC), Colitis, Colitis cystica profunda, Colon polyps, Complication of anesthesia, Depression, Diverticulitis, ED (erectile dysfunction), Genital herpes, Hypertension, Kidney disease, Lytic lesion of bone on x-ray, Medical history non-contributory, Occipital lymphadenopathy, Peripheral vascular disease (HCC), Renal cancer (HCC), and Shortness of breath dyspnea.  PSH:    Past Surgical History:  Procedure Laterality Date  . APPENDECTOMY  1966  . COLON SURGERY    . IR GASTROSTOMY TUBE REMOVAL  09/02/2017  . KIDNEY SURGERY Right 2012   Nephrectomy  . LOWER EXTREMITY ANGIOGRAPHY Left 08/25/2017   Procedure: LOWER EXTREMITY ANGIOGRAPHY;  Surgeon: Renford Dills, MD;  Location: [facility] INVASIVE CV LAB;  Service: Cardiovascular;  Laterality: Left;  . PARTIAL COLECTOMY  2012   For perforated colon related to diverticulitis- Dr. Egbert Garibaldi  . VASECTOMY  2000  . VIDEO ASSISTED THORACOSCOPY (VATS)/THOROCOTOMY Left 08/13/2015   Procedure: LEFT THOROCOTOMY WITH LEFT UPPER LOBECTOMY, PREOP BRONCHOSCOPY;  Surgeon: Hulda Marin, MD;  Location: [facility] ORS;  Service: General;  Laterality: Left;    Current Outpatient Medications  Medication Sig Dispense Refill  . ADVAIR DISKUS 250-50 MCG/DOSE AEPB Inhale 1 puff into the lungs daily as needed (for shortness of breath or wheezing).   11  . amLODipine (NORVASC) 5 MG tablet Take 5 mg by mouth daily.  1  . Camphor-Phenol (CAMPHO-PHENIQUE EX) Apply 1 application topically as needed (for pain).    Marland Kitchen ibuprofen (ADVIL,MOTRIN) 200 MG tablet Take 400-800 mg by mouth every 6 (  six) hours as needed for mild pain or moderate pain.    Marland Kitchen levETIRAcetam (KEPPRA) 1000 MG tablet Take 1 tablet (1,000 mg total) by mouth 2 (two) times daily.  (Patient taking differently: Take 500 mg by mouth daily. ) 60 tablet 0  . meloxicam (MOBIC) 7.5 MG tablet Take 7.5 mg by mouth daily.     . mupirocin nasal ointment (BACTROBAN) 2 % Place 1 application into the nose 2 (two) times daily. Use one-half of tube in each nostril twice daily for five (5) days. After application, press sides of nose together and gently massage. 10 g 0  . oxyCODONE (OXY IR/ROXICODONE) 5 MG immediate release tablet Take 1 tablet (5 mg total) by mouth every 12 (twelve) hours as needed for severe pain. 30 tablet 0  . SUNItinib (SUTENT) 25 MG capsule Take 1 capsule (25 mg total) by mouth daily. Take for 14 days on, then 7 days off. Repeat every 21 days. 14 capsule 4   No current facility-administered medications for this visit.      Allergies:   Contrast media [iodinated diagnostic agents]   Social History:  The patient  reports that he has been smoking cigarettes. He has a 7.50 pack-year smoking history. He has never used smokeless tobacco. He reports that he drinks about 5.0 standard drinks of alcohol per week. He reports that he has current or past drug history. Drug: Marijuana.   Family History:   family history includes Alcoholism in his unknown relative; Arthritis in his mother; Arthritis/Rheumatoid in his unknown relative; Heart disease in his unknown relative.    Review of Systems: Review of Systems  Constitutional: Positive for weight loss.       Weakness  Respiratory: Negative.   Cardiovascular: Negative.   Gastrointestinal: Negative.   Musculoskeletal: Negative.        Pain in his feet  Skin:       Nonhealing lower extremity ulcers  Neurological: Negative.   Psychiatric/Behavioral: Negative.   All other systems reviewed and are negative.   PHYSICAL EXAM: VS:  BP 104/65 (BP Location: Right Arm, Patient Position: Sitting, Cuff Size: Normal)   Pulse 64   Ht 6\' 1"  (1.854 m)   Wt 138 lb 8 oz (62.8 kg)   BMI 18.27 kg/m  , BMI Body mass index is 18.27  kg/m. GEN: Thin, tall, presents in a wheelchair, anorexia HEENT: normal  Neck: no JVD, carotid bruits, or masses Cardiac: RRR; no murmurs, rubs, or gallops,no edema  Unable to appreciate lower extremity pulses Respiratory: Moderately decreased breath sounds throughout, normal work of breathing GI: soft, nontender, nondistended, + BS MS: no deformity, + muscle atrophy Skin: warm and dry, no rash Neuro: Grossly nonfocal Psych: Very quiet, minimally conversant   Recent Labs: 12/18/2017: ALT 12; BUN 20; Creatinine, Ser 0.84; Hemoglobin 13.4; Platelets 253; Potassium 4.5; Sodium 137    Lipid Panel Lab Results  Component Value Date   CHOL 147 06/12/2015   HDL 62.80 06/12/2015   LDLCALC 71 06/12/2015   TRIG 64.0 06/12/2015      Wt Readings from Last 3 Encounters:  12/21/17 138 lb 8 oz (62.8 kg)  12/18/17 139 lb 3.2 oz (63.1 kg)  12/17/17 155 lb (70.3 kg)       ASSESSMENT AND PLAN:  Demand ischemia (HCC) - Plan: EKG 12-Lead March 2019 episode of demand ischemia in the setting of significant stress, occluded ventricular shunt require during revision At that time had a fall hit his head,  No ischemic  work-up done at [facility] at the time No symptoms concerning for angina since that time  Atherosclerosis of native arteries of the extremities with ulceration (HCC) - Long discussion with patient and patient's wife concerning severe three-vessel coronary calcification seen on CT scan Wife reports that they have significant procedures and doctor visits in the next few weeks and do not want any further testing Scheduled to see neurology, vascular surgery with angiogram, was told to follow-up with primary care, had recent seizure Recommend they call our office for any anginal symptoms, And once they get through their appointments they could call for stress test if they would like.  Most likely has re-vessel disease -Continues to smoke  Chronic obstructive pulmonary disease, unspecified  COPD type (HCC) Long history of smoking, continues to smoke No desire to quit at this time  Primary cancer of right kidney with metastasis from kidney to other site [facility]) Notes from oncology were reviewed, currently not receiving chemotherapy it would appear given his frail nature  Scleroderma [facility]) Wife reports he is on amlodipine for scleroderma Blood pressure running very low  Disposition:   F/U as needed   Total encounter time more than 60 minutes  Greater than 50% was spent in counseling and coordination of care with the patient  Patient was seen in consultation for Dr. De Nurse and will be referred back to his office for ongoing care of the issues detailed above   Orders Placed This Encounter  Procedures  . EKG 12-Lead     Signed, Dossie Arbour, M.D., Ph.D. 12/21/2017  [facility] [place], [place] [phone]

## 2017-12-21 ENCOUNTER — Encounter: Payer: Self-pay | Admitting: Cardiovascular Disease

## 2017-12-21 ENCOUNTER — Ambulatory Visit (INDEPENDENT_AMBULATORY_CARE_PROVIDER_SITE_OTHER): Payer: BLUE CROSS/BLUE SHIELD | Admitting: Cardiovascular Disease

## 2017-12-21 VITALS — BP 104/65 | HR 64 | Ht 73.0 in | Wt 138.5 lb

## 2017-12-21 DIAGNOSIS — I248 Other forms of acute ischemic heart disease: Secondary | ICD-10-CM

## 2017-12-21 DIAGNOSIS — J449 Chronic obstructive pulmonary disease, unspecified: Secondary | ICD-10-CM | POA: Diagnosis not present

## 2017-12-21 DIAGNOSIS — C641 Malignant neoplasm of right kidney, except renal pelvis: Secondary | ICD-10-CM

## 2017-12-21 DIAGNOSIS — I7025 Atherosclerosis of native arteries of other extremities with ulceration: Secondary | ICD-10-CM | POA: Diagnosis not present

## 2017-12-21 DIAGNOSIS — Z720 Tobacco use: Secondary | ICD-10-CM

## 2017-12-21 DIAGNOSIS — M349 Systemic sclerosis, unspecified: Secondary | ICD-10-CM

## 2017-12-21 NOTE — Patient Instructions (Addendum)
Medication Instructions:  - Your physician recommends that you continue on your current medications as directed. Please refer to the Current Medication list given to you today.  If you need a refill on your cardiac medications before your next appointment, please call your pharmacy.   Lab work: - none ordered  If you have labs (blood work) drawn today and your tests are completely normal, you will receive your results only by: Marland Kitchen MyChart Message (if you have MyChart) OR . A paper copy in the mail If you have any lab test that is abnormal or we need to change your treatment, we will call you to review the results.  Testing/Procedures: - call the office if you decide you would like to proceed with a stress test  Follow-Up: At [facility], you and your health needs are our priority.  As part of our continuing mission to provide you with exceptional heart care, we have created designated Provider Care Teams.  These Care Teams include your primary Cardiologist (physician) and Advanced Practice Providers (APPs -  Physician Assistants and Nurse Practitioners) who all work together to provide you with the care you need, when you need it. Marland Kitchen as needed wtih Dr. Rockey Situ  Any Other Special Instructions Will Be Listed Below (If Applicable).  -  You are scheduled to see [facility] Neurology:  Chipper Herb, NP Wednesday 12/23/17  12:30 pm  [place], [place] [address]  [phone] if you need to change your appointment

## 2017-12-24 ENCOUNTER — Ambulatory Visit (INDEPENDENT_AMBULATORY_CARE_PROVIDER_SITE_OTHER): Payer: BLUE CROSS/BLUE SHIELD

## 2017-12-24 ENCOUNTER — Encounter (INDEPENDENT_AMBULATORY_CARE_PROVIDER_SITE_OTHER): Payer: Self-pay | Admitting: Vascular Surgery

## 2017-12-24 ENCOUNTER — Ambulatory Visit (INDEPENDENT_AMBULATORY_CARE_PROVIDER_SITE_OTHER): Payer: BLUE CROSS/BLUE SHIELD | Admitting: Vascular Surgery

## 2017-12-24 ENCOUNTER — Encounter

## 2017-12-24 VITALS — BP 105/67 | HR 50 | Resp 17 | Ht 73.0 in | Wt 137.0 lb

## 2017-12-24 DIAGNOSIS — I7025 Atherosclerosis of native arteries of other extremities with ulceration: Secondary | ICD-10-CM | POA: Diagnosis not present

## 2017-12-24 DIAGNOSIS — F17210 Nicotine dependence, cigarettes, uncomplicated: Secondary | ICD-10-CM

## 2017-12-24 DIAGNOSIS — L97512 Non-pressure chronic ulcer of other part of right foot with fat layer exposed: Secondary | ICD-10-CM

## 2017-12-24 DIAGNOSIS — J449 Chronic obstructive pulmonary disease, unspecified: Secondary | ICD-10-CM

## 2017-12-24 NOTE — Progress Notes (Signed)
MRN : [id number]  Thomas Le is a 62 y.o. ([birth year]) male who presents with chief complaint of  Chief Complaint  Patient presents with  . Follow-up    ABI and LLE Arterial follow up  .  History of Present Illness:   The patient returns to the office for followup and review status post angiogram with intervention.   Procedure(s) Performed 08/25/2017: 1. Introduction catheter into right lower extremity 3rd order catheter placement with additional third order catheter placement 2. Contrast injection right lower extremity for distal runoff   3. Percutaneous transluminal angioplasty and stent placement in 2 locations right superficial femoral artery and popliteal artery 4. Crosser atherectomy right tibioperoneal trunk and peroneal artery with angioplasty to 2.5 mm successful              5.  Crosser atherectomy right anterior tibial artery with successful intraluminal reentry               6.  Star close closure left common femoral arteriotomy  The patient notes minimal improvement in the lower extremity symptoms. Previous wounds are "a little better".  No new ulcers or wounds have occurred since the last visit.  Overall he states he is still having pain in both legs and that the left leg is worse than the right at this time.  There have been no significant changes to the patient's overall health care.  The patient denies amaurosis fugax or recent TIA symptoms. There are no recent neurological changes noted. The patient denies history of DVT, PE or superficial thrombophlebitis. The patient denies recent episodes of angina or shortness of breath.   ABI's Rt=0.96 and Lt=0.66   Duplex US of the bilateral lower extremity arterial system shows right SFA and popliteal is patent and the left SFA and pop appears patent there is extensive bilateral tibial disease  No outpatient medications have been marked as taking for the 12/24/17  encounter (Office Visit) with Gilda Crease, Latina Craver, MD.    Past Medical History:  Diagnosis Date  . Allergic rhinitis   . Anxiety   . Arthritis   . Asthma   . Chronic bronchitis (HCC)   . Colitis    Had blood in his stool with this and treated in the hospital  . Colitis cystica profunda   . Colon polyps   . Complication of anesthesia    when waking up get claustophobic with mask, anxiety, panic  . Depression   . Diverticulitis   . ED (erectile dysfunction)   . Genital herpes   . Hypertension   . Kidney disease   . Lytic lesion of bone on x-ray   . Medical history non-contributory   . Occipital lymphadenopathy   . Peripheral vascular disease (HCC)   . Renal cancer (HCC)   . Shortness of breath dyspnea     Past Surgical History:  Procedure Laterality Date  . APPENDECTOMY  1966  . COLON SURGERY    . IR GASTROSTOMY TUBE REMOVAL  09/02/2017  . KIDNEY SURGERY Right 2012   Nephrectomy  . LOWER EXTREMITY ANGIOGRAPHY Left 08/25/2017   Procedure: LOWER EXTREMITY ANGIOGRAPHY;  Surgeon: Renford Dills, MD;  Location: [facility] INVASIVE CV LAB;  Service: Cardiovascular;  Laterality: Left;  . PARTIAL COLECTOMY  2012   For perforated colon related to diverticulitis- Dr. Egbert Garibaldi  . VASECTOMY  2000  . VIDEO ASSISTED THORACOSCOPY (VATS)/THOROCOTOMY Left 08/13/2015   Procedure: LEFT THOROCOTOMY WITH LEFT UPPER LOBECTOMY, PREOP BRONCHOSCOPY;  Surgeon: Hulda Marin,  MD;  Location: [facility] ORS;  Service: General;  Laterality: Left;    Social History Social History   Tobacco Use  . Smoking status: Current Every Day Smoker    Packs/day: 0.25    Years: 30.00    Pack years: 7.50    Types: Cigarettes  . Smokeless tobacco: Never Used  Substance Use Topics  . Alcohol use: Yes    Alcohol/week: 5.0 standard drinks    Types: 5 Cans of beer per week    Comment: 5 beers a night   . Drug use: Yes    Types: Marijuana    Family History Family History  Problem Relation Age of Onset  .  Arthritis/Rheumatoid Unknown        Parent  . Heart disease Unknown        Grandparent  . Alcoholism Unknown        Other relative  . Arthritis Mother        Rhematoid    Allergies  Allergen Reactions  . Contrast Media [Iodinated Diagnostic Agents] Nausea And Vomiting     REVIEW OF SYSTEMS (Negative unless checked)  Constitutional: [] Weight loss  [] Fever  [] Chills Cardiac: [] Chest pain   [] Chest pressure   [] Palpitations   [] Shortness of breath when laying flat   [] Shortness of breath with exertion. Vascular:  [x] Pain in legs with walking   [x] Pain in legs at rest  [] History of DVT   [] Phlebitis   [x] Swelling in legs   [] Varicose veins   [] Non-healing ulcers Pulmonary:   [] Uses home oxygen   [] Productive cough   [] Hemoptysis   [] Wheeze  [] COPD   [] Asthma Neurologic:  [] Dizziness   [] Seizures   [] History of stroke   [] History of TIA  [] Aphasia   [] Vissual changes   [] Weakness or numbness in arm   [] Weakness or numbness in leg Musculoskeletal:   [] Joint swelling   [x] Joint pain   [] Low back pain Hematologic:  [] Easy bruising  [] Easy bleeding   [] Hypercoagulable state   [] Anemic Gastrointestinal:  [] Diarrhea   [] Vomiting  [] Gastroesophageal reflux/heartburn   [] Difficulty swallowing. Genitourinary:  [] Chronic kidney disease   [] Difficult urination  [] Frequent urination   [] Blood in urine Skin:  [x] Rashes   [x] Ulcers  Psychological:  [x] History of anxiety   []  History of major depression.  Physical Examination  Vitals:   12/24/17 1142  BP: 105/67  Pulse: (!) 50  Resp: 17  Weight: 137 lb (62.1 kg)  Height: 6\' 1"  (1.854 m)   Body mass index is 18.07 kg/m. Gen: WD/WN, NAD Head: [place]/AT, No temporalis wasting.  Ear/Nose/Throat: Hearing grossly intact, nares w/o erythema or drainage Eyes: PER, EOMI, sclera nonicteric.  Neck: Supple, no large masses.   Pulmonary:  Good air movement, no audible wheezing bilaterally, no use of accessory muscles.  Cardiac: RRR, no JVD Vascular:  bilateral ulcers uninfected Vessel Right Left  Radial Palpable Palpable  Femoral Palpable Palpable  Popliteal Trace Palpable Trace Palpable  PT Not Palpable Not Palpable  DP Not Palpable Not Palpable  Gastrointestinal: Non-distended. No guarding/no peritoneal signs.  Musculoskeletal: M/S 5/5 throughout.  No deformity or atrophy.  Neurologic: CN 2-12 intact. Symmetrical.  Speech is fluent. Motor exam as listed above. Psychiatric: Judgment intact, Mood & affect appropriate for pt's clinical situation. Dermatologic: No rashes or ulcers noted.  No changes consistent with cellulitis. Lymph : No lichenification or skin changes of chronic lymphedema.  CBC Lab Results  Component Value Date   WBC 9.0 12/18/2017   HGB 13.4 12/18/2017  HCT 42.8 12/18/2017   MCV 102.1 (H) 12/18/2017   PLT 253 12/18/2017    BMET    Component Value Date/Time   NA 137 12/18/2017 1421   NA 137 12/08/2012 0529   K 4.5 12/18/2017 1421   K 4.0 12/08/2012 0529   CL 104 12/18/2017 1421   CL 106 12/08/2012 0529   CO2 27 12/18/2017 1421   CO2 24 12/08/2012 0529   GLUCOSE 98 12/18/2017 1421   GLUCOSE 76 12/08/2012 0529   BUN 20 12/18/2017 1421   BUN 6 (L) 12/08/2012 0529   CREATININE 0.84 12/18/2017 1421   CREATININE 1.11 12/08/2012 0529   CALCIUM 9.4 12/18/2017 1421   CALCIUM 8.1 (L) 12/08/2012 0529   GFRNONAA >60 12/18/2017 1421   GFRNONAA >60 12/08/2012 0529   GFRAA >60 12/18/2017 1421   GFRAA >60 12/08/2012 0529   Estimated Creatinine Clearance: 79.1 mL/min (by C-G formula based on SCr of 0.84 mg/dL).  COAG Lab Results  Component Value Date   INR 0.95 05/06/2017   INR 1.0 05/13/2011    Radiology Ct Head Wo Contrast  Result Date: 12/17/2017 CLINICAL DATA:  Recent seizure activity, history of prior craniotomy for mass removal and shunt placement EXAM: CT HEAD WITHOUT CONTRAST TECHNIQUE: Contiguous axial images were obtained from the base of the skull through the vertex without intravenous  contrast. COMPARISON:  07/24/2017 FINDINGS: Brain: Postsurgical changes are again noted in the right frontal region consistent with the prior surgical history. A shunt catheter is noted in place which extends across the midline new from the prior exam. The lateral ventricles are nearly completely decompressed. No findings to suggest acute hemorrhage, acute infarct or space-occupying mass lesion are seen. Prior areas of encephalomalacia related to the surgery and prior shunt placement on the left are noted. Vascular: No hyperdense vessel or unexpected calcification. Skull: Changes of prior right frontal craniotomy. Ines Bloomer hole is seen on the left related to prior shunt placement. Sinuses/Orbits: No acute finding. Other: None. IMPRESSION: Shunt catheter in place coursing across the midline into the left lateral ventricle anteriorly. This is a significant change when compared with the prior MRI. The lateral ventricles are now decompressed. Postsurgical changes are noted stable from the previous exam. No acute abnormality is noted. Electronically Signed   By: Alcide Clever M.D.   On: 12/17/2017 11:54      Assessment/Plan 1. Atherosclerosis of native arteries of the extremities with ulceration (HCC)  Recommend:  The patient has evidence of severe atherosclerotic changes of both lower extremities associated with ulceration and tissue loss of the foot.  This represents a limb threatening ischemia and places the patient at the risk for limb loss.  Patient should undergo angiography of the left lower extremity with the hope for intervention for limb salvage.  The risks and benefits as well as the alternative therapies was discussed in detail with the patient.  All questions were answered.  Patient agrees to proceed with left leg angiogram.  The patient will follow up with me in the office after the procedure.    A total of 30 minutes was spent with this patient and greater than 50% was spent in counseling and  coordination of care with the patient.  Right leg angiogram was pulled up on the computer and the images and interventions reviewed.  Discussion included the treatment options for vascular disease including indications for surgery and intervention.  Also discussed is the appropriate timing of treatment.  In addition medical therapy was discussed.  2. Chronic obstructive pulmonary disease, unspecified COPD type (HCC) Continue pulmonary medications and aerosols as already ordered, these medications have been reviewed and there are no changes at this time.    3. Skin ulcer of toe of right foot with fat layer exposed (HCC) See #1   Levora Dredge, MD  12/24/2017 11:50 AM

## 2017-12-30 ENCOUNTER — Other Ambulatory Visit (INDEPENDENT_AMBULATORY_CARE_PROVIDER_SITE_OTHER): Payer: Self-pay | Admitting: Nurse Practitioner

## 2017-12-31 ENCOUNTER — Telehealth: Payer: Self-pay

## 2017-12-31 MED ORDER — DEXTROSE 5 % IV SOLN
2.0000 g | Freq: Once | INTRAVENOUS | Status: DC
  Filled 2017-12-31: qty 20

## 2017-12-31 NOTE — Telephone Encounter (Signed)
Nutrition  Received message patient was requesting more ensure.  RD called and left message on patient's voicemail asking for return call.  Jaelyn Bourgoin B. Zenia [place], [place], [place] Registered Dietitian [phone] (pager)

## 2018-01-01 ENCOUNTER — Other Ambulatory Visit (INDEPENDENT_AMBULATORY_CARE_PROVIDER_SITE_OTHER): Payer: Self-pay | Admitting: Vascular Surgery

## 2018-01-01 ENCOUNTER — Other Ambulatory Visit: Payer: Self-pay

## 2018-01-01 ENCOUNTER — Encounter
Admission: RE | Disposition: A | Discharge status: Home / Self Care | Payer: Self-pay | Source: Ambulatory Visit | Attending: Vascular Surgery

## 2018-01-01 ENCOUNTER — Ambulatory Visit
Admission: RE | Admit: 2018-01-01 | Discharge: 2018-01-01 | Disposition: A | Discharge status: Home / Self Care | Payer: BLUE CROSS/BLUE SHIELD | Source: Ambulatory Visit | Attending: Vascular Surgery | Admitting: Vascular Surgery

## 2018-01-01 ENCOUNTER — Encounter: Payer: Self-pay | Admitting: *Deleted

## 2018-01-01 DIAGNOSIS — L97909 Non-pressure chronic ulcer of unspecified part of unspecified lower leg with unspecified severity: Secondary | ICD-10-CM

## 2018-01-01 DIAGNOSIS — F17210 Nicotine dependence, cigarettes, uncomplicated: Secondary | ICD-10-CM | POA: Insufficient documentation

## 2018-01-01 DIAGNOSIS — Z8261 Family history of arthritis: Secondary | ICD-10-CM | POA: Diagnosis not present

## 2018-01-01 DIAGNOSIS — L97512 Non-pressure chronic ulcer of other part of right foot with fat layer exposed: Secondary | ICD-10-CM | POA: Insufficient documentation

## 2018-01-01 DIAGNOSIS — J449 Chronic obstructive pulmonary disease, unspecified: Secondary | ICD-10-CM | POA: Diagnosis not present

## 2018-01-01 DIAGNOSIS — Z8249 Family history of ischemic heart disease and other diseases of the circulatory system: Secondary | ICD-10-CM | POA: Diagnosis not present

## 2018-01-01 DIAGNOSIS — Z86010 Personal history of colonic polyps: Secondary | ICD-10-CM | POA: Insufficient documentation

## 2018-01-01 DIAGNOSIS — Z9049 Acquired absence of other specified parts of digestive tract: Secondary | ICD-10-CM | POA: Insufficient documentation

## 2018-01-01 DIAGNOSIS — I70222 Atherosclerosis of native arteries of extremities with rest pain, left leg: Secondary | ICD-10-CM | POA: Diagnosis not present

## 2018-01-01 DIAGNOSIS — Z85528 Personal history of other malignant neoplasm of kidney: Secondary | ICD-10-CM | POA: Diagnosis not present

## 2018-01-01 DIAGNOSIS — Z9852 Vasectomy status: Secondary | ICD-10-CM | POA: Diagnosis not present

## 2018-01-01 DIAGNOSIS — Z91041 Radiographic dye allergy status: Secondary | ICD-10-CM | POA: Insufficient documentation

## 2018-01-01 DIAGNOSIS — N529 Male erectile dysfunction, unspecified: Secondary | ICD-10-CM | POA: Diagnosis not present

## 2018-01-01 DIAGNOSIS — I70299 Other atherosclerosis of native arteries of extremities, unspecified extremity: Secondary | ICD-10-CM

## 2018-01-01 DIAGNOSIS — Z8619 Personal history of other infectious and parasitic diseases: Secondary | ICD-10-CM | POA: Diagnosis not present

## 2018-01-01 DIAGNOSIS — I129 Hypertensive chronic kidney disease with stage 1 through stage 4 chronic kidney disease, or unspecified chronic kidney disease: Secondary | ICD-10-CM | POA: Insufficient documentation

## 2018-01-01 DIAGNOSIS — M1990 Unspecified osteoarthritis, unspecified site: Secondary | ICD-10-CM | POA: Diagnosis not present

## 2018-01-01 DIAGNOSIS — Z905 Acquired absence of kidney: Secondary | ICD-10-CM | POA: Insufficient documentation

## 2018-01-01 DIAGNOSIS — I70203 Unspecified atherosclerosis of native arteries of extremities, bilateral legs: Secondary | ICD-10-CM | POA: Diagnosis not present

## 2018-01-01 DIAGNOSIS — Z9889 Other specified postprocedural states: Secondary | ICD-10-CM | POA: Diagnosis not present

## 2018-01-01 DIAGNOSIS — Z955 Presence of coronary angioplasty implant and graft: Secondary | ICD-10-CM | POA: Insufficient documentation

## 2018-01-01 DIAGNOSIS — N189 Chronic kidney disease, unspecified: Secondary | ICD-10-CM | POA: Diagnosis not present

## 2018-01-01 HISTORY — PX: LOWER EXTREMITY ANGIOGRAPHY: CATH118251

## 2018-01-01 LAB — CREATININE, SERUM
Creatinine, Ser: 0.91 mg/dL (ref 0.61–1.24)
GFR calc Af Amer: >60 mL/min (ref >60)
GFR calc non Af Amer: >60 mL/min (ref >60)

## 2018-01-01 LAB — BUN: BUN: 26 mg/dL — AB (ref 8–23)

## 2018-01-01 SURGERY — LOWER EXTREMITY ANGIOGRAPHY
Anesthesia: Moderate Sedation | Laterality: Left

## 2018-01-01 MED ORDER — MIDAZOLAM HCL 5 MG/5ML IJ SOLN
INTRAMUSCULAR | Status: AC
  Filled 2018-01-01: qty 5

## 2018-01-01 MED ORDER — CEFAZOLIN SODIUM-DEXTROSE 2-4 GM/100ML-% IV SOLN
INTRAVENOUS | Status: AC
  Filled 2018-01-01: qty 100

## 2018-01-01 MED ORDER — FENTANYL CITRATE (PF) 100 MCG/2ML IJ SOLN
INTRAMUSCULAR | Status: AC
  Filled 2018-01-01: qty 2

## 2018-01-01 MED ORDER — DIPHENHYDRAMINE HCL 50 MG/ML IJ SOLN
INTRAMUSCULAR | Status: AC
  Filled 2018-01-01: qty 1

## 2018-01-01 MED ORDER — FENTANYL CITRATE (PF) 100 MCG/2ML IJ SOLN
INTRAMUSCULAR | Status: DC | PRN
  Administered 2018-01-01 (×2): 12.5 ug via INTRAVENOUS
  Administered 2018-01-01: 25 ug via INTRAVENOUS
  Administered 2018-01-01: 50 ug via INTRAVENOUS
  Administered 2018-01-01 (×3): 25 ug via INTRAVENOUS

## 2018-01-01 MED ORDER — HEART ATTACK BOUNCING BOOK: Freq: Once | Status: DC

## 2018-01-01 MED ORDER — CLOPIDOGREL BISULFATE 75 MG PO TABS: 75.0000 mg | ORAL_TABLET | Freq: Every day | ORAL | 3 refills | Status: AC

## 2018-01-01 MED ORDER — ONDANSETRON HCL 4 MG/2ML IJ SOLN: 4.0000 mg | Freq: Four times a day (QID) | INTRAMUSCULAR | Status: DC | PRN

## 2018-01-01 MED ORDER — HYDROMORPHONE HCL 1 MG/ML IJ SOLN: 1.0000 mg | Freq: Once | INTRAMUSCULAR | Status: DC | PRN

## 2018-01-01 MED ORDER — HEPARIN (PORCINE) IN NACL 1000-0.9 UT/500ML-% IV SOLN
INTRAVENOUS | Status: AC
  Filled 2018-01-01: qty 1000

## 2018-01-01 MED ORDER — IOPAMIDOL (ISOVUE-300) INJECTION 61%
INTRAVENOUS | Status: DC | PRN
  Administered 2018-01-01: 80 mL via INTRAVENOUS

## 2018-01-01 MED ORDER — MIDAZOLAM HCL 2 MG/2ML IJ SOLN
INTRAMUSCULAR | Status: DC | PRN
  Administered 2018-01-01: 2 mg via INTRAVENOUS
  Administered 2018-01-01: 0.5 mg via INTRAVENOUS
  Administered 2018-01-01 (×2): 1 mg via INTRAVENOUS
  Administered 2018-01-01: 0.5 mg via INTRAVENOUS
  Administered 2018-01-01 (×2): 1 mg via INTRAVENOUS
  Administered 2018-01-01 (×2): 0.5 mg via INTRAVENOUS

## 2018-01-01 MED ORDER — ATORVASTATIN CALCIUM 10 MG PO TABS: 10.0000 mg | ORAL_TABLET | Freq: Every day | ORAL | 0 refills | Status: DC

## 2018-01-01 MED ORDER — DIPHENHYDRAMINE HCL 50 MG/ML IJ SOLN
25.0000 mg | INTRAMUSCULAR | Status: AC
  Administered 2018-01-01: 25 mg via INTRAVENOUS

## 2018-01-01 MED ORDER — LIDOCAINE HCL (PF) 1 % IJ SOLN
INTRAMUSCULAR | Status: AC
  Filled 2018-01-01: qty 30

## 2018-01-01 MED ORDER — HEPARIN SODIUM (PORCINE) 1000 UNIT/ML IJ SOLN
INTRAMUSCULAR | Status: AC
  Filled 2018-01-01: qty 1

## 2018-01-01 MED ORDER — FAMOTIDINE 20 MG PO TABS
40.0000 mg | ORAL_TABLET | ORAL | Status: DC | PRN
  Administered 2018-01-01: 40 mg via ORAL

## 2018-01-01 MED ORDER — FAMOTIDINE IN NACL 20-0.9 MG/50ML-% IV SOLN
20.0000 mg | INTRAVENOUS | Status: DC
  Filled 2018-01-01: qty 50

## 2018-01-01 MED ORDER — SODIUM CHLORIDE 0.9 % IV SOLN
INTRAVENOUS | Status: DC
  Administered 2018-01-01: 10:00:00 via INTRAVENOUS

## 2018-01-01 MED ORDER — METHYLPREDNISOLONE SODIUM SUCC 125 MG IJ SOLR: 125.0000 mg | INTRAMUSCULAR | Status: DC

## 2018-01-01 MED ORDER — HEPARIN SODIUM (PORCINE) 1000 UNIT/ML IJ SOLN
INTRAMUSCULAR | Status: DC | PRN
  Administered 2018-01-01: 5000 [IU] via INTRAVENOUS

## 2018-01-01 MED ORDER — METHYLPREDNISOLONE SODIUM SUCC 125 MG IJ SOLR
125.0000 mg | INTRAMUSCULAR | Status: DC | PRN
  Administered 2018-01-01: 125 mg via INTRAVENOUS

## 2018-01-01 MED ORDER — METHYLPREDNISOLONE SODIUM SUCC 125 MG IJ SOLR
INTRAMUSCULAR | Status: AC
  Administered 2018-01-01: 125 mg via INTRAVENOUS
  Filled 2018-01-01: qty 2

## 2018-01-01 MED ORDER — FAMOTIDINE 20 MG PO TABS
ORAL_TABLET | ORAL | Status: AC
  Filled 2018-01-01: qty 2

## 2018-01-01 MED ORDER — MORPHINE SULFATE (PF) 4 MG/ML IV SOLN: 2.0000 mg | INTRAVENOUS | Status: DC | PRN

## 2018-01-01 SURGICAL SUPPLY — 29 items
BALLN LUTONIX 6X150X130 (BALLOONS)
BALLN LUTONIX DCB 6X100X130 (BALLOONS) ×2
BALLN LUTONIX DCB 6X80X130 (BALLOONS) ×2
BALLN ULTRASCORE 5X100X130 (BALLOONS) ×2
BALLN ULTRVRSE 3X40X150 (BALLOONS) ×1
BALLN ULTRVRSE 3X40X150 OTW (BALLOONS) ×1
BALLOON LUTONIX 6X150X130 (BALLOONS) IMPLANT
BALLOON LUTONIX DCB 6X100X130 (BALLOONS) ×1 IMPLANT
BALLOON LUTONIX DCB 6X80X130 (BALLOONS) ×1 IMPLANT
BALLOON ULTRASCORE 5X100X130 (BALLOONS) ×1 IMPLANT
BALLOON ULTRVRSE 3X40X150 OTW (BALLOONS) ×1 IMPLANT
CATH PIG 70CM (CATHETERS) ×2 IMPLANT
CATH SEEKER .018X150 (CATHETERS) ×2 IMPLANT
CATH VERT 5FR 125CM (CATHETERS) ×2 IMPLANT
COVER PROBE U/S 5X48 (MISCELLANEOUS) ×2 IMPLANT
DEVICE PRESTO INFLATION (MISCELLANEOUS) ×4 IMPLANT
DEVICE STARCLOSE SE CLOSURE (Vascular Products) ×2 IMPLANT
DEVICE TORQUE .025-.038 (MISCELLANEOUS) ×2 IMPLANT
GUIDEWIRE PFTE-COATED .018X300 (WIRE) ×2 IMPLANT
NEEDLE ENTRY 21GA 7CM ECHOTIP (NEEDLE) ×2 IMPLANT
PACK ANGIOGRAPHY (CUSTOM PROCEDURE TRAY) ×2 IMPLANT
SET INTRO CAPELLA COAXIAL (SET/KITS/TRAYS/PACK) ×2 IMPLANT
SHEATH BRITE TIP 5FRX11 (SHEATH) ×2 IMPLANT
SHEATH HIGHFLEX ANSEL 6FRX55 (SHEATH) ×2 IMPLANT
TUBING CONTRAST HIGH PRESS 72 (TUBING) ×2 IMPLANT
WIRE AMPLATZ SSTIFF .035X260CM (WIRE) ×2 IMPLANT
WIRE AQUATRACK .035X260CM (WIRE) ×2 IMPLANT
WIRE G V18X300CM (WIRE) ×2 IMPLANT
WIRE J 3MM .035X145CM (WIRE) ×2 IMPLANT

## 2018-01-01 NOTE — Op Note (Signed)
Thomas Le Percutaneous Study/Intervention Procedural Note   Date of Surgery: 01/01/2018  Surgeon: Hortencia Pilar  Pre-operative Diagnosis: Atherosclerotic occlusive disease bilateral lower extremities with left lower extremity  Post-operative diagnosis: Same  Procedure(s) Performed: 1. Introduction catheter into left lower extremity 3rd order catheter placement  2. Contrast injection left lower extremity for distal runoff  3. Percutaneous transluminal angioplasty left superficial femoral to 6 mm with a Lutonix drug-eluting balloon 4. Percutaneous transluminal angioplasty left anterior tibial to 3 mm  5. Star close closure right common femoral arteriotomy  Anesthesia: Conscious sedation was administered under my direct supervision by the interventional radiology RN. IV Versed plus fentanyl were utilized. Continuous ECG, pulse oximetry and blood pressure was monitored throughout the entire procedure.  Conscious sedation was for a total of 1 hour 17 minutes.  Sheath: 6 [place] Ansell high flex right common femoral retrograde  Contrast: 80 cc  Fluoroscopy Time: 16.5 minutes  Indications: Thomas Le presents with increasing pain of the left lower extremity. This suggests the patient is having limb threatening ischemia. The risks and benefits are reviewed all questions answered patient agrees to proceed.  Procedure:Thomas Le is a 62 y.o. y.o. male who was identified and appropriate procedural time out was performed. The patient was then placed supine on the table and prepped and draped in the usual sterile fashion.   Ultrasound was placed in the sterile sleeve and the right groin was evaluated the right common femoral artery was echolucent and pulsatile indicating patency. Image was recorded for the permanent record and under real-time visualization a microneedle was inserted into  the common femoral artery followed by the microwire and then the micro-sheath. A J-wire was then advanced through the micro-sheath and a 5 [place] sheath was then inserted over a J-wire. J-wire was then advanced and a 5 French pigtail catheter was positioned at the level of T12.  AP projection of the aorta was then obtained. Pigtail catheter was repositioned to above the bifurcation and a RAO view of the pelvis was obtained. Subsequently a pigtail catheter with the stiff angle Glidewire was used to cross the aortic bifurcation the catheter wire were advanced down into the left distal external iliac artery. Oblique view of the femoral bifurcation was then obtained and subsequently the wire was reintroduced and the pigtail catheter negotiated into the SFA representing third order catheter placement. Distal runoff was then performed.  This showed patency of the aorta iliac common femoral profunda femoris.  The superficial femoral on the left demonstrated a total occlusion over a short segment with multiple greater than 70% stenoses in the mid one third.  Hunter's canal the SFA is widely patent and the popliteal arteries patent throughout its course.  There is complete occlusion of the trifurcation.  Magnified catheter directed imaging of the anterior tibial shows that there is a string sign in its first 2 to 3 cm.  Distally the anterior tibial seems to be patent but is very small consistent with diffuse small vessel disease.  However the patient was very uncooperative and moved continuously.  And therefore distal imaging is suboptimal.  Tibioperoneal trunk is occluded.  There is no evidence for a posterior tibial.  There does appear to be reconstitution of the mid peroneal which again appears to be patent to the ankle but this is very difficult to interpret as there was extreme motion artifact.  5000 units of heparin was then given and allowed to circulate and a 6 [place] high  flex sheath was advanced up  and over the bifurcation and positioned in the superficial femoral artery.  Catheter and stiff angle Glidewire were then negotiated down into the distal popliteal. Catheter was then advanced. Hand injection contrast demonstrated the tibial anatomy in detail.  She had a 5 mm x 100 mm ultra score balloon was used to angioplasty the mid SFA.  2 separate inflations were required.  Each inflation was for 1 minute at 12 atm.  Following this a 6 mm x 100 mm Lutonix drug-eluting balloon in association with a 6 mm x 80 mm Lutonix drug-eluting balloon was then used.  Inflations were to 10 atm for 2 minutes each.  Follow-up imaging demonstrated excellent patency with preservation of the distal runoff, less than 5% residual stenosis is noted throughout the entire SFA.  The detector was then repositioned and the trifurcation was imaged in a magnified view.  0.018 advantage wire was then negotiated into the anterior tibial.  Subsequently a seeker Oni catheter was advanced.  Hand-injection contrast demonstrated the distal anatomy as described above.  At this point the patient's continuous movement became very problematic as far as proper visualization.  I believe this could also create a safety issue with multiple injections of contrast required an increasing fluoroscopy time given the motion artifact.  The anterior tibial straight string sign greater than 95% in its proximal few centimeters.  I remove the catheter and advanced a 3 mm x 40 mm Ultraverse balloon.  Inflation was to 10 atm for 1 minute.  Follow-up imaging demonstrated less than than 5% residual stenosis and patency with excellent result. Distal runoff was then reassessed and noted to be widely patent.   After review of these images the sheath is pulled into the right external iliac oblique of the common femoral is obtained and a Star close device deployed. There no immediate Complications.  Findings: The abdominal aorta is opacified with a bolus injection  contrast. Renal arteries are patent. The aorta itself has diffuse disease but no hemodynamically significant lesions.   This showed patency of the aorta iliac common femoral profunda femoris.  The superficial femoral on the left demonstrated a total occlusion over a short segment with multiple greater than 70% stenoses in the mid one third.  Hunter's canal the SFA is widely patent and the popliteal arteries patent throughout its course.  There is complete occlusion of the trifurcation.  Magnified catheter directed imaging of the anterior tibial shows that there is a string sign in its first 2 to 3 cm.  Distally the anterior tibial seems to be patent but is very small consistent with diffuse small vessel disease.  However the patient was very uncooperative and moved continuously.  And therefore distal imaging is suboptimal.  Tibioperoneal trunk is occluded.  There is no evidence for a posterior tibial.  There does appear to be reconstitution of the mid peroneal which again appears to be patent to the ankle but this is very difficult to interpret as there was extreme motion artifact.  Following angioplasty anterior tibial now is in-line flow and looks good with less than 5% residual stenosis at the site of intervention. Angioplasty of the SFA at Hunter's canal yields an excellent result with less than 5% residual stenosis.  Summary: Successful recanalization left lower extremity for limb salvage   Disposition: Patient was taken to the recovery room in stable condition having tolerated the procedure well.  Thomas Le 01/01/2018,11:58 AM

## 2018-01-01 NOTE — H&P (Signed)
[facility] History & Physical Update  The patient was interviewed and re-examined.  The patient's previous History and Physical has been reviewed and is unchanged.  There is no change in the plan of care. We plan to proceed with the scheduled procedure.  Hortencia Pilar, MD  01/01/2018, 9:54 AM

## 2018-01-05 ENCOUNTER — Other Ambulatory Visit: Payer: Self-pay

## 2018-01-05 DIAGNOSIS — C641 Malignant neoplasm of right kidney, except renal pelvis: Secondary | ICD-10-CM

## 2018-01-07 MED FILL — SUTENT 25 MG CAPSULE: 25 | 21 days supply | Qty: 14 | Fill #2

## 2018-01-11 ENCOUNTER — Inpatient Hospital Stay: Payer: BLUE CROSS/BLUE SHIELD | Admitting: Internal Medicine

## 2018-01-11 ENCOUNTER — Inpatient Hospital Stay: Payer: BLUE CROSS/BLUE SHIELD

## 2018-01-18 ENCOUNTER — Other Ambulatory Visit (INDEPENDENT_AMBULATORY_CARE_PROVIDER_SITE_OTHER): Payer: Self-pay | Admitting: Vascular Surgery

## 2018-01-18 DIAGNOSIS — Z9862 Peripheral vascular angioplasty status: Secondary | ICD-10-CM

## 2018-01-18 DIAGNOSIS — I70222 Atherosclerosis of native arteries of extremities with rest pain, left leg: Secondary | ICD-10-CM

## 2018-01-20 ENCOUNTER — Ambulatory Visit (INDEPENDENT_AMBULATORY_CARE_PROVIDER_SITE_OTHER): Payer: BLUE CROSS/BLUE SHIELD

## 2018-01-20 ENCOUNTER — Ambulatory Visit (INDEPENDENT_AMBULATORY_CARE_PROVIDER_SITE_OTHER): Payer: BLUE CROSS/BLUE SHIELD | Admitting: Vascular Surgery

## 2018-01-20 ENCOUNTER — Encounter (INDEPENDENT_AMBULATORY_CARE_PROVIDER_SITE_OTHER): Payer: Self-pay | Admitting: Nurse Practitioner

## 2018-01-20 VITALS — BP 109/66 | HR 69 | Resp 16 | Ht 73.0 in | Wt 139.8 lb

## 2018-01-20 DIAGNOSIS — F17210 Nicotine dependence, cigarettes, uncomplicated: Secondary | ICD-10-CM | POA: Diagnosis not present

## 2018-01-20 DIAGNOSIS — I70222 Atherosclerosis of native arteries of extremities with rest pain, left leg: Secondary | ICD-10-CM | POA: Diagnosis not present

## 2018-01-20 DIAGNOSIS — I10 Essential (primary) hypertension: Secondary | ICD-10-CM | POA: Insufficient documentation

## 2018-01-20 DIAGNOSIS — E785 Hyperlipidemia, unspecified: Secondary | ICD-10-CM

## 2018-01-20 DIAGNOSIS — I7025 Atherosclerosis of native arteries of other extremities with ulceration: Secondary | ICD-10-CM | POA: Diagnosis not present

## 2018-01-20 DIAGNOSIS — Z9862 Peripheral vascular angioplasty status: Secondary | ICD-10-CM

## 2018-01-20 NOTE — Progress Notes (Signed)
Subjective:    Patient ID: Thomas Le, male    DOB: October 01, 1955, 62 y.o.   MRN: [id number] Chief Complaint  Patient presents with  . Follow-up    [facility] 2week ABI   Patient presents status post a left lower extremity angiogram with intervention.  On January 01, 2018 the patient underwent, introduction catheter into left lower extremity 3rd order catheter placement, contrast injection left lower extremity for distal runoff, percutaneous transluminal angioplasty left superficial femoral to 6 mm with a Lutonix drug-eluting balloon, percutaneous transluminal angioplasty left anterior tibial to 3mm with Star close closure right common femoral arteriotomy. On August 25, 2017, the patient underwent, introduction catheter into right lower extremity 3rd order catheter placement with additional third order catheter placement, contrast injection right lower extremity for distal runoff, percutaneous transluminal angioplasty and stent placement in 2 locations right superficial femoral artery and popliteal artery, crosser atherectomy right tibioperoneal trunk and peroneal artery with angioplasty to 2.5 mm successful, crosser atherectomy right anterior tibial artery with successful intraluminal reentry with Star close closure left common femoral arteriotomy.  Patient seen with wife.  The patient denies any claudication-like symptoms, rest pain or new ulcer formation.  The patient's wife notes minimal to the bilateral foot wounds that he has.  At this time, the patient does not receive any local wound care.  The patient underwent a bilateral ABI which was notable for right: 0.90, biphasic anterior tibial artery with monophasic posterior tibial artery.  Left: ABI: 0.98, Monophasic anterior tibial artery and posterior tibial artery.  There has been an improvement to the arterial patency of the bilateral lower extremity when compared to previous ABI.  The patient denies any fever, nausea vomiting.  Worsening in his foot  ulcerations.  Review of Systems  Constitutional: Negative.   HENT: Negative.   Eyes: Negative.   Respiratory: Negative.   Cardiovascular: Negative.   Gastrointestinal: Negative.   Endocrine: Negative.   Genitourinary: Negative.   Musculoskeletal: Negative.   Skin: Positive for wound.  Allergic/Immunologic: Negative.   Neurological: Negative.   Hematological: Negative.   Psychiatric/Behavioral: Negative.       Objective:   Physical Exam  Constitutional: He is oriented to person, place, and time. He appears well-developed and well-nourished. No distress.  HENT:  Head: Normocephalic and atraumatic.  Right Ear: External ear normal.  Left Ear: External ear normal.  Eyes: Pupils are equal, round, and reactive to light. Conjunctivae and EOM are normal.  Neck: Normal range of motion.  Cardiovascular: Normal rate, regular rhythm, normal heart sounds and intact distal pulses.  Pulses:      Radial pulses are 2+ on the right side, and 2+ on the left side.       Dorsalis pedis pulses are 1+ on the right side, and 1+ on the left side.       Posterior tibial pulses are 1+ on the right side, and 1+ on the left side.  Pulmonary/Chest: Effort normal and breath sounds normal.  Musculoskeletal: Normal range of motion. He exhibits no edema.  Neurological: He is alert and oriented to person, place, and time.  Skin: Skin is warm and dry. He is not diaphoretic.  Psychiatric: He has a normal mood and affect. His behavior is normal. Judgment and thought content normal.  Vitals reviewed.  BP 109/66 (BP Location: Left Arm)   Pulse 69   Resp 16   Ht 6\' 1"  (1.854 m)   Wt 139 lb 12.8 oz (63.4 kg)   BMI 18.44  kg/m   Past Medical History:  Diagnosis Date  . Allergic rhinitis   . Anxiety   . Arthritis   . Asthma   . Chronic bronchitis (HCC)   . Colitis    Had blood in his stool with this and treated in the hospital  . Colitis cystica profunda   . Colon polyps   . Complication of anesthesia      when waking up get claustophobic with mask, anxiety, panic  . Depression   . Diverticulitis   . ED (erectile dysfunction)   . Genital herpes   . Hypertension   . Kidney disease   . Lytic lesion of bone on x-ray   . Medical history non-contributory   . Occipital lymphadenopathy   . Peripheral vascular disease (HCC)   . Renal cancer (HCC)   . Shortness of breath dyspnea    Social History   Socioeconomic History  . Marital status: Married    Spouse name: Not on file  . Number of children: Not on file  . Years of education: Not on file  . Highest education level: Not on file  Occupational History  . Not on file  Social Needs  . Financial resource strain: Not on file  . Food insecurity:    Worry: Not on file    Inability: Not on file  . Transportation needs:    Medical: Not on file    Non-medical: Not on file  Tobacco Use  . Smoking status: Current Every Day Smoker    Packs/day: 0.25    Years: 30.00    Pack years: 7.50    Types: Cigarettes  . Smokeless tobacco: Never Used  Substance and Sexual Activity  . Alcohol use: Yes    Alcohol/week: 5.0 standard drinks    Types: 5 Cans of beer per week    Comment: 5 beers a night   . Drug use: Yes    Types: Marijuana  . Sexual activity: Yes  Lifestyle  . Physical activity:    Days per week: Not on file    Minutes per session: Not on file  . Stress: Not on file  Relationships  . Social connections:    Talks on phone: Not on file    Gets together: Not on file    Attends religious service: Not on file    Active member of club or organization: Not on file    Attends meetings of clubs or organizations: Not on file    Relationship status: Not on file  . Intimate partner violence:    Fear of current or ex partner: Not on file    Emotionally abused: Not on file    Physically abused: Not on file    Forced sexual activity: Not on file  Other Topics Concern  . Not on file  Social History Narrative  . Not on file   Past  Surgical History:  Procedure Laterality Date  . APPENDECTOMY  1966  . COLON SURGERY    . IR GASTROSTOMY TUBE REMOVAL  09/02/2017  . KIDNEY SURGERY Right 2012   Nephrectomy  . LOWER EXTREMITY ANGIOGRAPHY Left 08/25/2017   Procedure: LOWER EXTREMITY ANGIOGRAPHY;  Surgeon: Renford Dills, MD;  Location: [facility];  Service: Cardiovascular;  Laterality: Left;  . LOWER EXTREMITY ANGIOGRAPHY Left 01/01/2018   Procedure: LOWER EXTREMITY ANGIOGRAPHY;  Surgeon: Renford Dills, MD;  Location: [facility]ASIVE CV LAB;  Service: Cardiovascular;  Laterality: Left;  . PARTIAL COLECTOMY  2012   For  perforated colon related to diverticulitis- Dr. Egbert Garibaldi  . VASECTOMY  2000  . VIDEO ASSISTED THORACOSCOPY (VATS)/THOROCOTOMY Left 08/13/2015   Procedure: LEFT THOROCOTOMY WITH LEFT UPPER LOBECTOMY, PREOP BRONCHOSCOPY;  Surgeon: Hulda Marin, MD;  Location: [facility] ORS;  Service: General;  Laterality: Left;   Family History  Problem Relation Age of Onset  . Arthritis/Rheumatoid Unknown        Parent  . Heart disease Unknown        Grandparent  . Alcoholism Unknown        Other relative  . Arthritis Mother        Rhematoid   Allergies  Allergen Reactions  . Iodinated Diagnostic Agents Nausea And Vomiting      Assessment & Plan:  Patient presents status post a left lower extremity angiogram with intervention.  On January 01, 2018 the patient underwent, introduction catheter into left lower extremity 3rd order catheter placement, contrast injection left lower extremity for distal runoff, percutaneous transluminal angioplasty left superficial femoral to 6 mm with a Lutonix drug-eluting balloon, percutaneous transluminal angioplasty left anterior tibial to 3mm with Star close closure right common femoral arteriotomy. On August 25, 2017, the patient underwent, introduction catheter into right lower extremity 3rd order catheter placement with additional third order catheter placement, contrast injection right  lower extremity for distal runoff, percutaneous transluminal angioplasty and stent placement in 2 locations right superficial femoral artery and popliteal artery, crosser atherectomy right tibioperoneal trunk and peroneal artery with angioplasty to 2.5 mm successful, crosser atherectomy right anterior tibial artery with successful intraluminal reentry with Star close closure left common femoral arteriotomy.  Patient seen with wife.  The patient denies any claudication-like symptoms, rest pain or new ulcer formation.  The patient's wife notes minimal to the bilateral foot wounds that he has.  At this time, the patient does not receive any local wound care.  The patient underwent a bilateral ABI which was notable for right: 0.90, biphasic anterior tibial artery with monophasic posterior tibial artery.  Left: ABI: 0.98, Monophasic anterior tibial artery and posterior tibial artery.  There has been an improvement to the arterial patency of the bilateral lower extremity when compared to previous ABI.  The patient denies any fever, nausea vomiting.  Worsening in his foot ulcerations.  1. Atherosclerosis of native arteries of the extremities with ulceration (HCC) - Stable The patient is now s/p bilateral endovascular interventions to the lower extremities. There is now an improvement to the arterial patency to the bilateral extremities however the patient continues to experience slow wound healing to the wounds on his bilateral feet. He denies any worsening to the wounds or new wound formation. We discussed consultation with podiatry versus the [facility] wound center. The patient's wife is a patient of Dr. Alberteen Spindle and would like me to place a referral for an evaluation with Dr. Alberteen Spindle in regard to the patient's foot wounds. I will see the patient back in 3 months to repeat an ABI and bilateral lower extremity arterial duplex I have discussed with the patient at length the risk factors for and pathogenesis of  atherosclerotic disease and encouraged a healthy diet, regular exercise regimen and blood pressure / glucose control.  The patient was encouraged to call the office in the interim if he experiences any claudication like symptoms, rest pain or ulcers to his feet / toes.  - Ambulatory referral to Podiatry - VAS [place] ABI WITH/WO TBI; Future - VAS [place] LOWER EXTREMITY ARTERIAL DUPLEX; Future  2. Essential hypertension -  Stable Appropriate medications Encouraged good control as its slows the progression of atherosclerotic disease  3. Hyperlipidemia, unspecified hyperlipidemia type - Stable Appropriate medications Encouraged good control as its slows the progression of atherosclerotic disease  Current Outpatient Medications on File Prior to Visit  Medication Sig Dispense Refill  . ADVAIR DISKUS 250-50 MCG/DOSE AEPB Inhale 1 puff into the lungs daily as needed (for shortness of breath or wheezing).   11  . amLODipine (NORVASC) 5 MG tablet Take 5 mg by mouth daily at 3 pm.   1  . aspirin EC 81 MG tablet Take 81 mg by mouth daily at 3 pm.    . atorvastatin (LIPITOR) 10 MG tablet Take 1 tablet (10 mg total) by mouth daily. 30 tablet 0  . clopidogrel (PLAVIX) 75 MG tablet Take 1 tablet (75 mg total) by mouth daily. 30 tablet 3  . levETIRAcetam (KEPPRA) 500 MG tablet Take 500 mg by mouth daily at 3 pm.  3  . meloxicam (MOBIC) 7.5 MG tablet Take 7.5 mg by mouth daily at 3 pm.     . mupirocin nasal ointment (BACTROBAN) 2 % Place 1 application into the nose 2 (two) times daily. Use one-half of tube in each nostril twice daily for five (5) days. After application, press sides of nose together and gently massage. (Patient taking differently: Place 1 application into the nose daily as needed (for nasal sores.). Use one-half of tube in each nostril twice daily for five (5) days. After application, press sides of nose together and gently massage.) 10 g 0  . oxyCODONE (OXY IR/ROXICODONE) 5 MG immediate release  tablet Take 1 tablet (5 mg total) by mouth every 12 (twelve) hours as needed for severe pain. 30 tablet 0  . SUNItinib (SUTENT) 25 MG capsule Take 1 capsule (25 mg total) by mouth daily. Take for 14 days on, then 7 days off. Repeat every 21 days. (Patient taking differently: Take 25 mg by mouth See admin instructions. Take for 14 days on, then 7 days off. Repeat every 21 days.) 14 capsule 4  . urea (CARMOL) 40 % CREA Apply 1 application topically daily as needed (to affected areas of feet.).     Marland Kitchen levETIRAcetam (KEPPRA) 1000 MG tablet Take 1 tablet (1,000 mg total) by mouth 2 (two) times daily. (Patient not taking: Reported on 12/30/2017) 60 tablet 0   No current facility-administered medications on file prior to visit.    There are no Patient Instructions on file for this visit. No follow-ups on file.  Ivannah Zody A Keddrick Wyne, PA-C

## 2018-01-25 ENCOUNTER — Other Ambulatory Visit: Payer: Self-pay

## 2018-01-25 ENCOUNTER — Other Ambulatory Visit: Payer: Self-pay | Admitting: Acute Care

## 2018-01-25 ENCOUNTER — Inpatient Hospital Stay (HOSPITAL_BASED_OUTPATIENT_CLINIC_OR_DEPARTMENT_OTHER): Payer: BLUE CROSS/BLUE SHIELD | Admitting: Internal Medicine

## 2018-01-25 ENCOUNTER — Other Ambulatory Visit: Payer: Self-pay | Admitting: *Deleted

## 2018-01-25 ENCOUNTER — Inpatient Hospital Stay: Payer: BLUE CROSS/BLUE SHIELD | Attending: Internal Medicine

## 2018-01-25 ENCOUNTER — Encounter: Payer: Self-pay | Admitting: Internal Medicine

## 2018-01-25 VITALS — BP 101/68 | HR 74 | Temp 97.0°F | Resp 16 | Ht 73.0 in | Wt 142.0 lb

## 2018-01-25 DIAGNOSIS — I10 Essential (primary) hypertension: Secondary | ICD-10-CM | POA: Insufficient documentation

## 2018-01-25 DIAGNOSIS — Z7952 Long term (current) use of systemic steroids: Secondary | ICD-10-CM

## 2018-01-25 DIAGNOSIS — R0609 Other forms of dyspnea: Secondary | ICD-10-CM | POA: Insufficient documentation

## 2018-01-25 DIAGNOSIS — Z7982 Long term (current) use of aspirin: Secondary | ICD-10-CM | POA: Insufficient documentation

## 2018-01-25 DIAGNOSIS — R569 Unspecified convulsions: Secondary | ICD-10-CM

## 2018-01-25 DIAGNOSIS — J449 Chronic obstructive pulmonary disease, unspecified: Secondary | ICD-10-CM | POA: Insufficient documentation

## 2018-01-25 DIAGNOSIS — C641 Malignant neoplasm of right kidney, except renal pelvis: Secondary | ICD-10-CM | POA: Insufficient documentation

## 2018-01-25 DIAGNOSIS — M1990 Unspecified osteoarthritis, unspecified site: Secondary | ICD-10-CM | POA: Diagnosis not present

## 2018-01-25 DIAGNOSIS — F17210 Nicotine dependence, cigarettes, uncomplicated: Secondary | ICD-10-CM | POA: Insufficient documentation

## 2018-01-25 DIAGNOSIS — L97529 Non-pressure chronic ulcer of other part of left foot with unspecified severity: Secondary | ICD-10-CM | POA: Insufficient documentation

## 2018-01-25 DIAGNOSIS — Z7902 Long term (current) use of antithrombotics/antiplatelets: Secondary | ICD-10-CM | POA: Diagnosis not present

## 2018-01-25 DIAGNOSIS — R918 Other nonspecific abnormal finding of lung field: Secondary | ICD-10-CM | POA: Diagnosis not present

## 2018-01-25 DIAGNOSIS — Z79899 Other long term (current) drug therapy: Secondary | ICD-10-CM | POA: Diagnosis not present

## 2018-01-25 DIAGNOSIS — R5382 Chronic fatigue, unspecified: Secondary | ICD-10-CM

## 2018-01-25 DIAGNOSIS — I739 Peripheral vascular disease, unspecified: Secondary | ICD-10-CM | POA: Diagnosis not present

## 2018-01-25 DIAGNOSIS — C787 Secondary malignant neoplasm of liver and intrahepatic bile duct: Secondary | ICD-10-CM | POA: Insufficient documentation

## 2018-01-25 DIAGNOSIS — Z791 Long term (current) use of non-steroidal anti-inflammatories (NSAID): Secondary | ICD-10-CM

## 2018-01-25 DIAGNOSIS — F418 Other specified anxiety disorders: Secondary | ICD-10-CM

## 2018-01-25 DIAGNOSIS — R5381 Other malaise: Secondary | ICD-10-CM | POA: Diagnosis not present

## 2018-01-25 LAB — COMPREHENSIVE METABOLIC PANEL
ALK PHOS: 77 U/L (ref 38–126)
ALT: 14 U/L (ref 0–44)
ANION GAP: 10 (ref 5–15)
AST: 24 U/L (ref 15–41)
Albumin: 3.8 g/dL (ref 3.5–5.0)
BUN: 32 mg/dL — ABNORMAL HIGH (ref 8–23)
CALCIUM: 9.5 mg/dL (ref 8.9–10.3)
CO2: 24 mmol/L (ref 22–32)
CREATININE: 0.9 mg/dL (ref 0.61–1.24)
Chloride: 105 mmol/L (ref 98–111)
GFR calc Af Amer: >60 mL/min (ref >60)
Glucose, Bld: 108 mg/dL — ABNORMAL HIGH (ref 70–99)
Potassium: 4.4 mmol/L (ref 3.5–5.1)
Sodium: 139 mmol/L (ref 135–145)
TOTAL PROTEIN: 7.5 g/dL (ref 6.5–8.1)
Total Bilirubin: 0.3 mg/dL (ref 0.3–1.2)

## 2018-01-25 LAB — CBC WITH DIFFERENTIAL/PLATELET
Abs Immature Granulocytes: 0.02 10*3/uL (ref 0.00–0.07)
Basophils Absolute: 0 10*3/uL (ref 0.0–0.1)
Basophils Relative: 1 %
EOS PCT: 3 %
Eosinophils Absolute: 0.2 10*3/uL (ref 0.0–0.5)
HCT: 36.2 % — ABNORMAL LOW (ref 39.0–52.0)
Hemoglobin: 11 g/dL — ABNORMAL LOW (ref 13.0–17.0)
Immature Granulocytes: 0 %
Lymphocytes Relative: 28 %
Lymphs Abs: 2 10*3/uL (ref 0.7–4.0)
MCH: 30.8 pg (ref 26.0–34.0)
MCHC: 30.4 g/dL (ref 30.0–36.0)
MCV: 101.4 fL — ABNORMAL HIGH (ref 80.0–100.0)
MONO ABS: 0.4 10*3/uL (ref 0.1–1.0)
Monocytes Relative: 6 %
Neutro Abs: 4.5 10*3/uL (ref 1.7–7.7)
Neutrophils Relative %: 62 %
Platelets: 338 10*3/uL (ref 150–400)
RBC: 3.57 MIL/uL — ABNORMAL LOW (ref 4.22–5.81)
RDW: 14 % (ref 11.5–15.5)
WBC: 7.2 10*3/uL (ref 4.0–10.5)
nRBC: 0 % (ref 0.0–0.2)

## 2018-01-25 LAB — LACTATE DEHYDROGENASE: LDH: 169 U/L (ref 98–192)

## 2018-01-25 MED ORDER — DIPHENHYDRAMINE HCL 25 MG PO TABS: 25.0000 mg | ORAL_TABLET | Freq: Once | ORAL | 0 refills | Status: AC

## 2018-01-25 MED ORDER — PREDNISONE 50 MG PO TABS: ORAL_TABLET | ORAL | 0 refills | Status: AC

## 2018-01-25 MED ORDER — RANITIDINE HCL 150 MG PO TABS: 150.0000 mg | ORAL_TABLET | Freq: Once | ORAL | 0 refills | Status: AC

## 2018-01-25 NOTE — Progress Notes (Signed)
Patient aware that prednisone rx will be sent to pharmacy along with zantac and benadryl

## 2018-01-25 NOTE — Progress Notes (Signed)
[place] OFFICE PROGRESS NOTE  Patient Care Team: Leone Haven, MD as PCP - General (Family Medicine) Cammie Sickle, MD as Medical Oncologist (Medical Oncology)  Cancer Staging No matching staging information was found for the patient.   Oncology History   # JULY 2017 METASTATIC RCC- GOOD RISK [s/p LUL lung section;Dr.Oaks]; RLL- nodule ~66m  # June 2017- ? Frontal lesion on CT [cannot have MRI]  # RIGHT KIDNEY CANCER [incidental 2012; diverticulitis] pT3a (4.3x4.3x 3.2cm) [[facility] clear cell G-2; Neg margins; May 2012 ]  # July 20th- PAZOPANIB 4 pills/day; Discontinued sec to Elevated LFTs.   # OCT 2017- Start Sunitinib 2w-On; 1 w-OFF; CT NOV 13th- RLL- Improved; no new disease. MARCH 7th CT- CR. SUTENT-HOLD [since July 2018-sec to fatigue/poor tol]  # march 2019- Brain met [s/p resection/ s/p VP shunt; Dr.Jaikumar]; Bx of liver lesion- RCC [[facility]]  # May 12th 2019- Cabo 40 mg/day; SEP 2019- STOPPED SEC to intol [fatigue/legs ulcers]  # OCT 4th 2019- Re-Start Sutent 25 mg- 2w/1w  # ? Scleroderma [Almodipine; Dr.Kernodle]- CONTRAINDICATION to IMMUNOTHERAPY; Severe PVD s/p angio & stenting [Dr.Schneir; July 2019] -----------------------------------    DIAGNOSIS: '[ ]'  RCC  STAGE:  IV ; GOALS: pallaitive  CURRENT/MOST RECENT THERAPY [ Oct 4th 2019- Sutent]     Primary cancer of right kidney with metastasis from kidney to other site ([facility]      INTERVAL HISTORY:  Thomas [id number].  male pleasant patient above history of kidney cancer metastatic-currently on Sutent is here for follow-up.  In the interim patient had a seizure which was a short duration at home.  Since then he has been reevaluated by neurology-recommended MRI.  And also recommended switching over from Keppra to Depakote.  Patient has not made the switch yet.  Continues to have bilateral lower extremity ulcers.  Noted to have worsening left foot ulcer.  Continues to have chronic  cough chronic fatigue.  Chronic shortness of breath on exertion. Review of Systems  Constitutional: Positive for malaise/fatigue. Negative for chills, diaphoresis, fever and weight loss.  HENT: Negative for nosebleeds and sore throat.   Eyes: Negative for double vision.  Respiratory: Positive for shortness of breath. Negative for cough, hemoptysis, sputum production and wheezing.   Cardiovascular: Negative for chest pain, palpitations, orthopnea and leg swelling.  Gastrointestinal: Negative for abdominal pain, blood in stool, constipation, diarrhea, heartburn, melena, nausea and vomiting.  Genitourinary: Negative for dysuria, frequency and urgency.  Musculoskeletal: Positive for joint pain.  Skin: Negative.  Negative for itching and rash.  Neurological: Negative for dizziness, tingling, focal weakness, weakness and headaches.  Endo/Heme/Allergies: Does not bruise/bleed easily.  Psychiatric/Behavioral: The patient does not have insomnia.       PAST MEDICAL HISTORY :  Past Medical History:  Diagnosis Date  . Allergic rhinitis   . Anxiety   . Arthritis   . Asthma   . [place]   . Colitis    Had blood in his stool with this and treated in the hospital  . Colitis cystica profunda   . Colon polyps   . Complication of anesthesia    when waking up get claustophobic with mask, anxiety, panic  . Depression   . Diverticulitis   . ED (erectile dysfunction)   . Genital herpes   . Hypertension   . Kidney disease   . Lytic lesion of bone on x-ray   . Medical history non-contributory   . Occipital lymphadenopathy   .  Peripheral vascular disease ([place])   . Renal cancer ([place])   . Shortness of breath dyspnea     PAST SURGICAL HISTORY :   Past Surgical History:  Procedure Laterality Date  . APPENDECTOMY  1966  . COLON SURGERY    . IR GASTROSTOMY TUBE REMOVAL  09/02/2017  . KIDNEY SURGERY Right 2012   Nephrectomy  . LOWER EXTREMITY ANGIOGRAPHY Left 08/25/2017   Procedure:  LOWER EXTREMITY ANGIOGRAPHY;  Surgeon: Katha Cabal, MD;  Location: [facility];  Service: Cardiovascular;  Laterality: Left;  . LOWER EXTREMITY ANGIOGRAPHY Left 01/01/2018   Procedure: LOWER EXTREMITY ANGIOGRAPHY;  Surgeon: Katha Cabal, MD;  Location: [facility];  Service: Cardiovascular;  Laterality: Left;  . PARTIAL COLECTOMY  2012   For perforated colon related to diverticulitis- Dr. Marina Gravel  . VASECTOMY  2000  . VIDEO ASSISTED THORACOSCOPY (VATS)/THOROCOTOMY Left 08/13/2015   Procedure: LEFT THOROCOTOMY WITH LEFT UPPER LOBECTOMY, PREOP BRONCHOSCOPY;  Surgeon: Nestor Lewandowsky, MD;  Location: [facility];  Service: General;  Laterality: Left;    FAMILY HISTORY :   Family History  Problem Relation Age of Onset  . Arthritis/Rheumatoid Unknown        Parent  . Heart disease Unknown        Grandparent  . Alcoholism Unknown        Other relative  . Arthritis Mother        Rhematoid    SOCIAL HISTORY:   Social History   Tobacco Use  . Smoking status: Current Every Day Smoker    Packs/day: 0.25    Years: 30.00    Pack years: 7.50    Types: Cigarettes  . Smokeless tobacco: Never Used  Substance Use Topics  . Alcohol use: Yes    Alcohol/week: 5.0 standard drinks    Types: 5 Cans of beer per week    Comment: 5 beers a night   . Drug use: Yes    Types: Marijuana    ALLERGIES:  is allergic to iodinated diagnostic agents.  MEDICATIONS:  Current Outpatient Medications  Medication Sig Dispense Refill  . amLODipine (NORVASC) 5 MG tablet Take 5 mg by mouth daily at 3 pm.   1  . aspirin EC 81 MG tablet Take 81 mg by mouth daily at 3 pm.    . atorvastatin (LIPITOR) 10 MG tablet Take 1 tablet (10 mg total) by mouth daily. 30 tablet 0  . clopidogrel (PLAVIX) 75 MG tablet Take 1 tablet (75 mg total) by mouth daily. 30 tablet 3  . levETIRAcetam (KEPPRA) 500 MG tablet Take 500 mg by mouth daily at 3 pm.  3  . oxyCODONE (OXY IR/ROXICODONE) 5 MG immediate release  tablet Take 1 tablet (5 mg total) by mouth every 12 (twelve) hours as needed for severe pain. 30 tablet 0  . SUNItinib (SUTENT) 25 MG capsule Take 1 capsule (25 mg total) by mouth daily. Take for 14 days on, then 7 days off. Repeat every 21 days. (Patient taking differently: Take 25 mg by mouth See admin instructions. Take for 14 days on, then 7 days off. Repeat every 21 days.) 14 capsule 4  . urea (CARMOL) 40 % CREA Apply 1 application topically daily as needed (to affected areas of feet.).     Marland Kitchen diphenhydrAMINE (BENADRYL) 25 MG tablet Take 1 tablet (25 mg total) by mouth once for 1 dose. Take 1 hour prior to ct scan 1 tablet 0  . divalproex (DEPAKOTE) 250 MG DR tablet  Take 1 tablet by mouth 2 (two) times daily.    . meloxicam (MOBIC) 7.5 MG tablet Take 7.5 mg by mouth daily at 3 pm.     . mupirocin nasal ointment (BACTROBAN) 2 % Place 1 application into the nose 2 (two) times daily. Use one-half of tube in each nostril twice daily for five (5) days. After application, press sides of nose together and gently massage. (Patient not taking: Reported on 01/25/2018) 10 g 0  . predniSONE (DELTASONE) 50 MG tablet Take 1 tablet-13 hours, then 7 hours, and again & 1 hour prior to ct scan 3 tablet 0  . ranitidine (ZANTAC) 150 MG tablet Take 1 tablet (150 mg total) by mouth once for 1 dose. Take 1 tablet 1 hr prior to the procedure. 1 tablet 0   No current facility-administered medications for this visit.     PHYSICAL EXAMINATION: ECOG PERFORMANCE STATUS: 2 - Symptomatic, <50% confined to bed  BP 101/68 (BP Location: Left Arm, Patient Position: Sitting)   Pulse 74   Temp (!) 97 F (36.1 C) (Oral)   Resp 16   Ht '6\' 1"'  (1.854 m)   Wt 142 lb (64.4 kg)   BMI 18.73 kg/m   Filed Weights   01/25/18 1339  Weight: 142 lb (64.4 kg)    Physical Exam  Constitutional: He is oriented to person, place, and time.  Thin built cachectic appearing male patient.  He is walking by himself.  Accompanied by his wife.   HENT:  Head: Normocephalic and atraumatic.  Mouth/Throat: Oropharynx is clear and moist. No oropharyngeal exudate.  Eyes: Pupils are equal, round, and reactive to light.  Neck: Normal range of motion. Neck supple.  Cardiovascular: Normal rate and regular rhythm.  Pulmonary/Chest: No respiratory distress. He has no wheezes.  Decreased breath sounds bilaterally.  Abdominal: Soft. Bowel sounds are normal. He exhibits no distension and no mass. There is no tenderness. There is no rebound and no guarding.  Musculoskeletal: Normal range of motion. He exhibits no edema or tenderness.  Neurological: He is alert and oriented to person, place, and time.  Skin: Skin is warm.  bil Toe ulceration.  Desquamation of the skin; toe ulceration.  Psychiatric: Affect normal.       LABORATORY DATA:  I have reviewed the data as listed    Component Value Date/Time   NA 139 01/25/2018 1320   NA 137 12/08/2012 0529   K 4.4 01/25/2018 1320   K 4.0 12/08/2012 0529   CL 105 01/25/2018 1320   CL 106 12/08/2012 0529   CO2 24 01/25/2018 1320   CO2 24 12/08/2012 0529   GLUCOSE 108 (H) 01/25/2018 1320   GLUCOSE 76 12/08/2012 0529   BUN 32 (H) 01/25/2018 1320   BUN 6 (L) 12/08/2012 0529   CREATININE 0.90 01/25/2018 1320   CREATININE 1.11 12/08/2012 0529   CALCIUM 9.5 01/25/2018 1320   CALCIUM 8.1 (L) 12/08/2012 0529   PROT 7.5 01/25/2018 1320   PROT 6.9 12/06/2012 0901   ALBUMIN 3.8 01/25/2018 1320   ALBUMIN 3.5 12/06/2012 0901   AST 24 01/25/2018 1320   AST 13 (L) 12/06/2012 0901   ALT 14 01/25/2018 1320   ALT 15 12/06/2012 0901   ALKPHOS 77 01/25/2018 1320   ALKPHOS 73 12/06/2012 0901   BILITOT 0.3 01/25/2018 1320   BILITOT 0.8 12/06/2012 0901   GFRNONAA >60 01/25/2018 1320   GFRNONAA >60 12/08/2012 0529   GFRAA >60 01/25/2018 1320   GFRAA >60 12/08/2012 0529  No results found for: SPEP, UPEP  Lab Results  Component Value Date   WBC 7.2 01/25/2018   NEUTROABS 4.5 01/25/2018   HGB  11.0 (L) 01/25/2018   HCT 36.2 (L) 01/25/2018   MCV 101.4 (H) 01/25/2018   PLT 338 01/25/2018      Chemistry      Component Value Date/Time   NA 139 01/25/2018 1320   NA 137 12/08/2012 0529   K 4.4 01/25/2018 1320   K 4.0 12/08/2012 0529   CL 105 01/25/2018 1320   CL 106 12/08/2012 0529   CO2 24 01/25/2018 1320   CO2 24 12/08/2012 0529   BUN 32 (H) 01/25/2018 1320   BUN 6 (L) 12/08/2012 0529   CREATININE 0.90 01/25/2018 1320   CREATININE 1.11 12/08/2012 0529      Component Value Date/Time   CALCIUM 9.5 01/25/2018 1320   CALCIUM 8.1 (L) 12/08/2012 0529   ALKPHOS 77 01/25/2018 1320   ALKPHOS 73 12/06/2012 0901   AST 24 01/25/2018 1320   AST 13 (L) 12/06/2012 0901   ALT 14 01/25/2018 1320   ALT 15 12/06/2012 0901   BILITOT 0.3 01/25/2018 1320   BILITOT 0.8 12/06/2012 0901       RADIOGRAPHIC STUDIES: I have personally reviewed the radiological images as listed and agreed with the findings in the report. No results found.   ASSESSMENT & PLAN:  Primary cancer of right kidney with metastasis from kidney to other site [facility]) #Clear cell kidney cancer; stage IV-clinically stable; July 2019 CT C/P-small lung nodules; liver metastases. Stable.   # Continue Sutent 56m 2 weeks on and 1 week OFF; plan to get a CT scan prior to next visit.  # Bil Foot pain- PVD/ scleroderma/ smoking/non-healing wounds of Bil LE- continue oxycodone twice daily as needed.  Worsened; recommend wound care referral again.   # Seizures- 11/23- CT brain-NEG for acute process. on Keppra; s/p neurology evaluation.defer to neurology keppra/depakote transition.  Recommend patient calls neurology office for further clarification.  # COPD/smoking-stable unfortunately, continues to smoke.  Stable.   # Disposition: # Follow up with MD/labs-cbc/cmp- 3 week; CT scan chest prior- Dr.B   Orders Placed This Encounter  Procedures  . CT CHEST W CONTRAST    Standing Status:   Future    Standing Expiration  Date:   01/26/2019    Order Specific Question:   If indicated for the ordered procedure, I authorize the administration of contrast media per Radiology protocol    Answer:   Yes    Order Specific Question:   Preferred imaging location?    Answer:   [facility]    Order Specific Question:   Radiology Contrast Protocol - do NOT remove file path    Answer:   [email or web address]    Order Specific Question:   ** REASON FOR EXAM (FREE TEXT)    Answer:   kidney cancer on chemo   All questions were answered. The patient knows to call the clinic with any problems, questions or concerns.      GCammie Sickle MD 01/25/2018 4:39 PM

## 2018-01-25 NOTE — Assessment & Plan Note (Addendum)
#  Clear cell kidney cancer; stage IV-clinically stable; July 2019 CT C/P-small lung nodules; liver metastases. Stable.   # Continue Sutent 25mg  2 weeks on and 1 week OFF; plan to get a CT scan prior to next visit.  # Bil Foot pain- PVD/ scleroderma/ smoking/non-healing wounds of Bil LE- continue oxycodone twice daily as needed.  Worsened; recommend wound care referral again.   # Seizures- 11/23- CT brain-NEG for acute process. on Keppra; s/p neurology evaluation.defer to neurology keppra/depakote transition.  Recommend patient calls neurology office for further clarification.  # COPD/smoking-stable unfortunately, continues to smoke.  Stable.   # Disposition: # Follow up with MD/labs-cbc/cmp- 3 week; CT scan chest prior- Dr.B

## 2018-01-26 MED FILL — SUTENT 25 MG CAPSULE: 25 | 21 days supply | Qty: 14 | Fill #3

## 2018-02-07 ENCOUNTER — Ambulatory Visit
Admission: RE | Admit: 2018-02-07 | Discharge: 2018-02-07 | Disposition: A | Discharge status: Home / Self Care | Payer: BLUE CROSS/BLUE SHIELD | Source: Ambulatory Visit | Attending: Acute Care | Admitting: Acute Care

## 2018-02-07 DIAGNOSIS — R569 Unspecified convulsions: Secondary | ICD-10-CM | POA: Insufficient documentation

## 2018-02-08 ENCOUNTER — Ambulatory Visit: Payer: Self-pay

## 2018-02-08 NOTE — Telephone Encounter (Signed)
Pt wife called to report her husband has had brain cancer and has been depressed.  She states that he cries all the time. She states that she want's Dr Caryl Bis to call. She states that he need a depression medication that does not cause seizures. She denies that her husband has plans to harm himself or her/others. She states that as soon as he got home from brain surgery that his father died.  She states he has been depressed and having panic attacks. He is fearful of having a seizure and nobody being with him. She has refused appointment and is requesting a call back from Dr Caryl Bis.   Reason for Disposition . [1] Depression AND [2] worsening (e.g.,sleeping poorly, less able to do activities of daily living)  Answer Assessment - Initial Assessment Questions 1. CONCERN: "What happened that made you call today?"     depression 2. DEPRESSION SYMPTOM SCREENING: "How are you feeling overall?" (e.g., decreased energy, increased sleeping or difficulty sleeping, difficulty concentrating, feelings of sadness, guilt, hopelessness, or worthlessness)     Sadness not sleeping 3. RISK OF HARM - SUICIDAL IDEATION:  "Do you ever have thoughts of hurting or killing yourself?"  (e.g., yes, no, no but preoccupation with thoughts about death)   - INTENT:  "Do you have thoughts of hurting or killing yourself right NOW?" (e.g., yes, no, N/A)   - PLAN: "Do you have a specific plan for how you would do this?" (e.g., gun, knife, overdose, no plan, N/A)     No per wife 4. RISK OF HARM - HOMICIDAL IDEATION:  "Do you ever have thoughts of hurting or killing someone else?"  (e.g., yes, no, no but preoccupation with thoughts about death)   - INTENT:  "Do you have thoughts of hurting or killing someone right NOW?" (e.g., yes, no, N/A)   - PLAN: "Do you have a specific plan for how you would do this?" (e.g., gun, knife, no plan, N/A)      No plan per wife 5. FUNCTIONAL IMPAIRMENT: "How have things been going for you  overall in your life? Have you had any more difficulties than usual doing your normal daily activities?"  (e.g., better, same, worse; self-care, school, work, interactions)    Panic attacks scared a lot 6. SUPPORT: "Who is with you now?" "Who do you live with?" "Do you have family or friends nearby who you can talk to?"      Family(wife) 7. THERAPIST: "Do you have a counselor or therapist? Name?"     no 8. STRESSORS: "Has there been any new stress or recent changes in your life?"     Yes father died 79. DRUG ABUSE/ALCOHOL: "Do you drink alcohol or use any illegal drugs?"      No oxycodone RX 10. OTHER: "Do you have any other health or medical symptoms right now?" (e.g., fever)       Sores on feet. Cold symptoms Crying all the time 11. PREGNANCY: "Is there any chance you are pregnant?" "When was your last menstrual period?"       NA  Protocols used: DEPRESSION-A-AH

## 2018-02-09 NOTE — Telephone Encounter (Signed)
Please contact the patients wife. I would like to see him for follow-up in the office. I can see him at 11:30 on Friday. It appears that the neurology office recently changed his seizure medication and they wanted to see how his mood did with that change. Thanks.

## 2018-02-09 NOTE — Telephone Encounter (Signed)
Sent to PCP. Please advise. Will schedule patient for an office visit if you feel that would be best.   Thanks

## 2018-02-09 NOTE — Telephone Encounter (Signed)
Called and spoke with patient's wife she stated that they can NOT come in on Friday due to CT scan at 12 on Friday.Plus not sure if they can come next week with the hoildays.  Pt really needs something wife stated to help with his mood. She stated that he really needs a few xanax before his CT on Friday is possible. Wife asked if it was OK to give him half of her before he has his CT to help with his nerves? She stated that she has done this in the past and he has taken xanax in the past.

## 2018-02-10 ENCOUNTER — Emergency Department
Admission: EM | Admit: 2018-02-10 | Discharge: 2018-02-10 | Disposition: A | Discharge status: Home / Self Care | Payer: BLUE CROSS/BLUE SHIELD | Attending: Emergency Medicine | Admitting: Emergency Medicine

## 2018-02-10 ENCOUNTER — Emergency Department: Payer: BLUE CROSS/BLUE SHIELD

## 2018-02-10 ENCOUNTER — Other Ambulatory Visit: Payer: Self-pay

## 2018-02-10 ENCOUNTER — Telehealth: Payer: Self-pay | Admitting: *Deleted

## 2018-02-10 ENCOUNTER — Encounter: Payer: Self-pay | Admitting: Emergency Medicine

## 2018-02-10 DIAGNOSIS — J449 Chronic obstructive pulmonary disease, unspecified: Secondary | ICD-10-CM | POA: Diagnosis not present

## 2018-02-10 DIAGNOSIS — I10 Essential (primary) hypertension: Secondary | ICD-10-CM | POA: Diagnosis not present

## 2018-02-10 DIAGNOSIS — Z7982 Long term (current) use of aspirin: Secondary | ICD-10-CM | POA: Diagnosis not present

## 2018-02-10 DIAGNOSIS — Z85528 Personal history of other malignant neoplasm of kidney: Secondary | ICD-10-CM | POA: Insufficient documentation

## 2018-02-10 DIAGNOSIS — R569 Unspecified convulsions: Secondary | ICD-10-CM

## 2018-02-10 DIAGNOSIS — Z7902 Long term (current) use of antithrombotics/antiplatelets: Secondary | ICD-10-CM | POA: Insufficient documentation

## 2018-02-10 DIAGNOSIS — Z79899 Other long term (current) drug therapy: Secondary | ICD-10-CM | POA: Insufficient documentation

## 2018-02-10 DIAGNOSIS — F17210 Nicotine dependence, cigarettes, uncomplicated: Secondary | ICD-10-CM | POA: Diagnosis not present

## 2018-02-10 DIAGNOSIS — F1210 Cannabis abuse, uncomplicated: Secondary | ICD-10-CM | POA: Insufficient documentation

## 2018-02-10 LAB — BASIC METABOLIC PANEL
Anion gap: 25 — ABNORMAL HIGH (ref 5–15)
BUN: 32 mg/dL — ABNORMAL HIGH (ref 8–23)
CO2: 13 mmol/L — ABNORMAL LOW (ref 22–32)
Calcium: 9.4 mg/dL (ref 8.9–10.3)
Chloride: 103 mmol/L (ref 98–111)
Creatinine, Ser: 1.18 mg/dL (ref 0.61–1.24)
GFR calc Af Amer: >60 mL/min (ref >60)
GFR calc non Af Amer: >60 mL/min (ref >60)
GLUCOSE: 109 mg/dL — AB (ref 70–99)
Potassium: 3.8 mmol/L (ref 3.5–5.1)
Sodium: 141 mmol/L (ref 135–145)

## 2018-02-10 LAB — CBC
HCT: 37.6 % — ABNORMAL LOW (ref 39.0–52.0)
Hemoglobin: 10.9 g/dL — ABNORMAL LOW (ref 13.0–17.0)
MCH: 31.1 pg (ref 26.0–34.0)
MCHC: 29 g/dL — ABNORMAL LOW (ref 30.0–36.0)
MCV: 107.1 fL — ABNORMAL HIGH (ref 80.0–100.0)
Platelets: 382 10*3/uL (ref 150–400)
RBC: 3.51 MIL/uL — ABNORMAL LOW (ref 4.22–5.81)
RDW: 14.6 % (ref 11.5–15.5)
WBC: 10.5 10*3/uL (ref 4.0–10.5)
nRBC: 0 % (ref 0.0–0.2)

## 2018-02-10 LAB — GLUCOSE, CAPILLARY
Glucose-Capillary: 167 mg/dL — ABNORMAL HIGH (ref 70–99)
Glucose-Capillary: 27 mg/dL — CL (ref 70–99)
Glucose-Capillary: 53 mg/dL — ABNORMAL LOW (ref 70–99)

## 2018-02-10 LAB — VALPROIC ACID LEVEL: Valproic Acid Lvl: 24 ug/mL — ABNORMAL LOW (ref 50.0–100.0)

## 2018-02-10 MED ORDER — DEXTROSE 50 % IV SOLN
INTRAVENOUS | Status: AC
  Administered 2018-02-10: 50 mL via INTRAVENOUS
  Filled 2018-02-10: qty 50

## 2018-02-10 MED ORDER — VALPROATE SODIUM 500 MG/5ML IV SOLN
1.0000 g | Freq: Once | INTRAVENOUS | Status: AC
  Administered 2018-02-10: 1000 mg via INTRAVENOUS
  Filled 2018-02-10: qty 10

## 2018-02-10 MED ORDER — DEXTROSE 50 % IV SOLN
1.0000 | Freq: Once | INTRAVENOUS | Status: AC
  Administered 2018-02-10: 50 mL via INTRAVENOUS

## 2018-02-10 MED ORDER — LORAZEPAM 2 MG/ML IJ SOLN
INTRAMUSCULAR | Status: AC
  Filled 2018-02-10: qty 1

## 2018-02-10 MED ORDER — SODIUM CHLORIDE 0.9 % IV SOLN
INTRAVENOUS | Status: DC | PRN
  Administered 2018-02-10: 18:00:00 via INTRAVENOUS

## 2018-02-10 NOTE — Discharge Instructions (Addendum)
Please seek medical attention for any high fevers, chest pain, shortness of breath, change in behavior, persistent vomiting, bloody stool or any other new or concerning symptoms.  

## 2018-02-10 NOTE — ED Triage Notes (Addendum)
Pt arrived via [facility] with reports of seizure activity.  Pt was being seen at the podiatry office when prior to the seizure, pt reported not feeling good and feeling nervous.  Per staff at [facility], pt had seizure activity for approximately 4 minutes.   Per [facility] nurse, pt received xanax about 30 minutes prior to arrival to [facility]. Pt post-ictal on arrival.

## 2018-02-10 NOTE — ED Notes (Addendum)
Pt more alert at this time, talking with wife and requesting something to drink.  Denies any needs at this time. Waiting for valproate to come up from pharmacy.

## 2018-02-10 NOTE — ED Notes (Signed)
Pt given 8oz cola.

## 2018-02-10 NOTE — Telephone Encounter (Signed)
Thomas Le- pt currently in ED for a seizure. Will await sending for anxiety medication at this time given the acute issues.  GB

## 2018-02-10 NOTE — ED Provider Notes (Signed)
[facility] Emergency Department Provider Note   ____________________________________________   I have reviewed the triage vital signs and the nursing notes.   HISTORY  Chief Complaint Seizures   History limited by and level 5 caveat due to: Post ictal state   HPI Thomas Le is a 62 y.o. male who presents to the emergency department today from podiatry office after a seizure. Nursing staff states that they were taking vital signs when it occurred. Apparently the patient was complaining that he felt nervous. He then started seizing. Initially it was his lower extremities and then the seizure extended to his upper extremities. Staff states it lasted roughly 4 minutes. The patient does appear to be in a post ictal state upon arrival to the emergency department and is unable to give any history. Per wife the patient has had seizures and was recently switched to Depakote roughly 1 week ago. Was taken off his keppra.   Per medical record review patient has a history of seizure  Past Medical History:  Diagnosis Date  . Allergic rhinitis   . Anxiety   . Arthritis   . Asthma   . Chronic bronchitis ([place])   . Colitis    Had blood in his stool with this and treated in the hospital  . Colitis cystica profunda   . Colon polyps   . Complication of anesthesia    when waking up get claustophobic with mask, anxiety, panic  . Depression   . Diverticulitis   . ED (erectile dysfunction)   . Genital herpes   . Hypertension   . Kidney disease   . Lytic lesion of bone on x-ray   . Medical history non-contributory   . Occipital lymphadenopathy   . Peripheral vascular disease ([place])   . Renal cancer ([place])   . Shortness of breath dyspnea     Patient Active Problem List   Diagnosis Date Noted  . Essential hypertension 01/20/2018  . Hyperlipidemia 01/20/2018  . Demand ischemia ([place]) 12/21/2017  . Sleeping difficulty 12/03/2017  . Elevated troponin 12/03/2017  .  History of brain shunt 10/24/2017  . Atherosclerosis of native arteries of the extremities with ulceration ([place]) 10/07/2017  . COPD (chronic obstructive pulmonary disease) ([place]) 10/07/2017  . Skin ulcer of toe of right foot with fat layer exposed ([place]) 08/10/2017  . Tobacco abuse 07/07/2017  . Goals of care, counseling/discussion 07/05/2017  . Allergic rhinitis 11/15/2015  . Primary cancer of right kidney with metastasis from kidney to other site [facility]) 08/30/2015  . Lung mass 08/13/2015  . Erectile dysfunction 06/14/2015  . Occipital lymphadenopathy 06/14/2015  . Anxiety and depression 05/14/2015  . Bilateral hand pain 05/14/2015  . Scleroderma ([place]) 05/14/2015  . Diarrhea 05/14/2015    Past Surgical History:  Procedure Laterality Date  . APPENDECTOMY  1966  . COLON SURGERY    . IR GASTROSTOMY TUBE REMOVAL  09/02/2017  . KIDNEY SURGERY Right 2012   Nephrectomy  . LOWER EXTREMITY ANGIOGRAPHY Left 08/25/2017   Procedure: LOWER EXTREMITY ANGIOGRAPHY;  Surgeon: Katha Cabal, MD;  Location: [place] [facility];  Service: Cardiovascular;  Laterality: Left;  . LOWER EXTREMITY ANGIOGRAPHY Left 01/01/2018   Procedure: LOWER EXTREMITY ANGIOGRAPHY;  Surgeon: Katha Cabal, MD;  Location: [place] [facility];  Service: Cardiovascular;  Laterality: Left;  . PARTIAL COLECTOMY  2012   For perforated colon related to diverticulitis- Dr. Marina Gravel  . VASECTOMY  2000  . VIDEO ASSISTED THORACOSCOPY (VATS)/THOROCOTOMY Left 08/13/2015  Procedure: LEFT THOROCOTOMY WITH LEFT UPPER LOBECTOMY, PREOP BRONCHOSCOPY;  Surgeon: Nestor Lewandowsky, MD;  Location: [facility] ORS;  Service: General;  Laterality: Left;    Prior to Admission medications   Medication Sig Start Date End Date Taking? Authorizing Provider  amLODipine (NORVASC) 5 MG tablet Take 5 mg by mouth daily at 3 pm.  08/18/17   [provider]  aspirin EC 81 MG tablet Take 81 mg by mouth daily at 3 pm.    [provider]  atorvastatin  (LIPITOR) 10 MG tablet Take 1 tablet (10 mg total) by mouth daily. 01/01/18 01/01/19  Schnier, Dolores Lory, MD  clopidogrel (PLAVIX) 75 MG tablet Take 1 tablet (75 mg total) by mouth daily. 01/01/18   Schnier, Dolores Lory, MD  diphenhydrAMINE (BENADRYL) 25 MG tablet Take 1 tablet (25 mg total) by mouth once for 1 dose. Take 1 hour prior to ct scan 01/25/18 01/25/18  Cammie Sickle, MD  divalproex (DEPAKOTE) 250 MG DR tablet Take 1 tablet by mouth 2 (two) times daily. 01/20/18 02/19/18  [provider]  levETIRAcetam (KEPPRA) 500 MG tablet Take 500 mg by mouth daily at 3 pm. 12/23/17   [provider]  meloxicam (MOBIC) 7.5 MG tablet Take 7.5 mg by mouth daily at 3 pm.     [provider]  mupirocin nasal ointment (BACTROBAN) 2 % Place 1 application into the nose 2 (two) times daily. Use one-half of tube in each nostril twice daily for five (5) days. After application, press sides of nose together and gently massage. Patient not taking: Reported on 01/25/2018 08/19/17   Cammie Sickle, MD  oxyCODONE (OXY IR/ROXICODONE) 5 MG immediate release tablet Take 1 tablet (5 mg total) by mouth every 12 (twelve) hours as needed for severe pain. 10/21/17   Cammie Sickle, MD  predniSONE (DELTASONE) 50 MG tablet Take 1 tablet-13 hours, then 7 hours, and again & 1 hour prior to ct scan 01/25/18   Cammie Sickle, MD  ranitidine (ZANTAC) 150 MG tablet Take 1 tablet (150 mg total) by mouth once for 1 dose. Take 1 tablet 1 hr prior to the procedure. 01/25/18 01/25/18  Cammie Sickle, MD  SUNItinib (SUTENT) 25 MG capsule Take 1 capsule (25 mg total) by mouth daily. Take for 14 days on, then 7 days off. Repeat every 21 days. Patient taking differently: Take 25 mg by mouth See admin instructions. Take for 14 days on, then 7 days off. Repeat every 21 days. 10/22/17   Cammie Sickle, MD  urea (CARMOL) 40 % CREA Apply 1 application topically daily as needed (to affected areas  of feet.).     [provider]    Allergies Iodinated diagnostic agents  Family History  Problem Relation Age of Onset  . Arthritis/Rheumatoid Other        Parent  . Heart disease Other        Grandparent  . Alcoholism Other        Other relative  . Arthritis Mother        Rhematoid    Social History Social History   Tobacco Use  . Smoking status: Current Every Day Smoker    Packs/day: 0.25    Years: 30.00    Pack years: 7.50    Types: Cigarettes  . Smokeless tobacco: Never Used  Substance Use Topics  . Alcohol use: Yes    Alcohol/week: 5.0 standard drinks    Types: 5 Cans of beer per week  Comment: 5 beers a night   . Drug use: Yes    Types: Marijuana    Review of Systems Unable to obtain secondary to post ictal state ____________________________________________   PHYSICAL EXAM:  VITAL SIGNS: ED Triage Vitals  Enc Vitals Group     BP 135/70     Pulse 72     Resp 31     Temp 97.7     Temp src      SpO2 100   Constitutional: Awake, eyes open and tracking. Non verbal  Eyes: Conjunctivae are normal. PERRL. ENT      Head: Normocephalic and atraumatic.      Nose: No congestion/rhinnorhea.      Mouth/Throat: Mucous membranes are moist.      Neck: No stridor. Hematological/Lymphatic/Immunilogical: No cervical lymphadenopathy. Cardiovascular: Normal rate, regular rhythm.  No murmurs, rubs, or gallops.  Respiratory: Normal respiratory effort without tachypnea nor retractions. Breath sounds are clear and equal bilaterally. No wheezes/rales/rhonchi. Gastrointestinal: Soft and non tender. No rebound. No guarding.  Genitourinary: Deferred Musculoskeletal: Normal range of motion in all extremities. No lower extremity edema. Neurologic:  Awake, tracking with eyes, non verbal Skin:  Skin is warm, dry and intact. No rash noted. ____________________________________________    LABS (pertinent positives/negatives)  Valproic acid 24 CBC wbc 10.5, hgb  10.9, plt 382 BMP na 141, k 3.8, glu 109, cr 1.18  ____________________________________________    RADIOLOGY  CT head No acute findings  ____________________________________________   PROCEDURES  Procedures  ____________________________________________   INITIAL IMPRESSION / ASSESSMENT AND PLAN / ED COURSE  Pertinent labs & imaging results that were available during my care of the patient were reviewed by me and considered in my medical decision making (see chart for details).   Patient presented to the emergency department today after a seizure at podiatry clinic.  Upon arrival patient was post ictal however during his stay he did become more alert, and was at his baseline and verbal.  Patient had recently started Depakote.  Will plan on loading Depakote.  Patient was to go up on his Depakote today.  Discussed with patient family importance of neurology follow-up.   ____________________________________________   FINAL CLINICAL IMPRESSION(S) / ED DIAGNOSES  Final diagnoses:  Seizure ([place])     Note: This dictation was prepared with Dragon dictation. Any transcriptional errors that result from this process are unintentional     Nance Pear, MD 02/10/18 2003

## 2018-02-10 NOTE — Telephone Encounter (Signed)
Wife called requesting we order something for patient anxiety and depression, citing he missed his MRI Monday due to a panic attack, she states he is teary all the time and upset and scared. She is asking we put him on Valium low dose. Please advise

## 2018-02-10 NOTE — ED Notes (Signed)
Pt transported to CT, ED RN Alivia accompanied pt to CT scanner.

## 2018-02-11 MED FILL — SUTENT 25 MG CAPSULE: 25 | 21 days supply | Qty: 14 | Fill #4

## 2018-02-11 NOTE — Telephone Encounter (Signed)
Noted.  It appears that the oncologist is aware of their request for this prescription.  The patient's wife should contact the cancer center in the morning to see if they can provide this prescription.  I will forward to our clinical pharmacist to discuss potential options for SSRIs given his history of seizures and that he is currently on Depakote.

## 2018-02-11 NOTE — Telephone Encounter (Signed)
They should get the xanax from the physician that has ordered the CT scan. She should not give him her xanax. There is not a medication that is going to help with his mood immediately. The medications used for depression typically take 8 weeks prior to there being a noticeable difference. The typical medications used include zoloft, lexapro, and others in that class. I am happy to start him something, though I need to consult with our pharmacist and then likely with his neurologist to choose the best option for him. The depakote he is currently on can help with mood so he may notice a difference as he is on that medication for a longer period of time.

## 2018-02-11 NOTE — Telephone Encounter (Signed)
Called and spoke with patient's wife. She stated that he husband had to go to the ED due to having a seizers due to increased anxiety.   Pt's wife stated that she did NOT ask for xanax for him she wanted to know if he could get a low dose valium. Husband has really bad anxiety and he was Dx with cancer and he is just very anxious. He has a CT tomorrow and he is claustrophobic.   She stated that she only mentioned the xanax bc that is what she had to give him to help him clam down.   I suggested for her to contact the doctor who schedule for him to do the CT to see if they would Rx valium. Pt's wife stated that she has called three time and no response.   Pt's wife is aware that PCP is out of the office. Sent to Dr. Caryl Bis pt's CT is 02/12/2018

## 2018-02-12 ENCOUNTER — Ambulatory Visit
Admission: RE | Admit: 2018-02-12 | Discharge: 2018-02-12 | Disposition: A | Discharge status: Home / Self Care | Payer: BLUE CROSS/BLUE SHIELD | Source: Ambulatory Visit | Attending: Internal Medicine | Admitting: Internal Medicine

## 2018-02-12 ENCOUNTER — Ambulatory Visit: Admission: RE | Admit: 2018-02-12 | Payer: BLUE CROSS/BLUE SHIELD | Source: Ambulatory Visit

## 2018-02-12 DIAGNOSIS — C641 Malignant neoplasm of right kidney, except renal pelvis: Secondary | ICD-10-CM | POA: Insufficient documentation

## 2018-02-12 HISTORY — DX: Systemic involvement of connective tissue, unspecified: M35.9

## 2018-02-12 MED ORDER — IOHEXOL 300 MG/ML  SOLN
75.0000 mL | Freq: Once | INTRAMUSCULAR | Status: AC | PRN
  Administered 2018-02-12: 75 mL via INTRAVENOUS

## 2018-02-12 NOTE — Telephone Encounter (Signed)
Called and spoke with patient's wife. Wife advised and voiced understanding.

## 2018-02-14 ENCOUNTER — Telehealth: Payer: Self-pay | Admitting: Family Medicine

## 2018-02-14 NOTE — Telephone Encounter (Signed)
Please let the patients wife know that I heard back from the clinical pharmacist and they noted that an antidepressant such as lexapro or celexa would be a good option, though there is potential for increased bleeding risk when taken in combination with meloxicam. Please see if he is continuing to take meloxicam and see if it is beneficial. We will then determine if these medications are a good option for him.

## 2018-02-14 NOTE — Telephone Encounter (Signed)
-----   Message from Vella Raring, [facility] sent at 02/12/2018 11:34 AM EST ----- Regarding: Responding to Medication Question on behalf of Thomas Le Dr. Caryl Bis,  I am covering for Thomas today, as she is on vacation.   SSRIs are good options for treatment of depression in seizures because they are unlikely to worsen seizure frequency. Of the SSRIs available, I would consider using citalopram or escitalopram, which have the relative lowest risk of drug-interactions. However, based on patient's medication list in chart, the addition any SSRI creates an interaction with meloxicam, increasing the bleeding risk, which is concerning as patient is also taking aspirin and clopidogrel. Please evaluate patient's need for continued use of daily meloxicam.   Please let me know if there is any further assistance that I may provide with this patient.  Loraine Maple, PharmD, [facility] [phone]  ------------------------------------------  Previous message from Leone Haven, MD  [name],   This patient has a recent history of seizures and is currently on depakote. He has been depressed and anxious and would benefit from an SSRI. I wanted to see if you had any data on what the best option would be given his seizure history and his current medication profile. Thanks for your help.   Thomas Le

## 2018-02-15 ENCOUNTER — Inpatient Hospital Stay: Payer: BLUE CROSS/BLUE SHIELD | Admitting: Hospice and Palliative Medicine

## 2018-02-15 ENCOUNTER — Other Ambulatory Visit: Payer: Self-pay | Admitting: Internal Medicine

## 2018-02-15 ENCOUNTER — Inpatient Hospital Stay (HOSPITAL_BASED_OUTPATIENT_CLINIC_OR_DEPARTMENT_OTHER): Payer: BLUE CROSS/BLUE SHIELD | Admitting: Internal Medicine

## 2018-02-15 ENCOUNTER — Other Ambulatory Visit: Payer: Self-pay

## 2018-02-15 ENCOUNTER — Inpatient Hospital Stay: Payer: BLUE CROSS/BLUE SHIELD

## 2018-02-15 ENCOUNTER — Ambulatory Visit: Payer: Self-pay

## 2018-02-15 ENCOUNTER — Encounter: Payer: Self-pay | Admitting: Internal Medicine

## 2018-02-15 VITALS — BP 97/67 | HR 78 | Temp 97.6°F | Resp 18 | Ht 73.0 in | Wt 142.0 lb

## 2018-02-15 DIAGNOSIS — Z79899 Other long term (current) drug therapy: Secondary | ICD-10-CM

## 2018-02-15 DIAGNOSIS — J449 Chronic obstructive pulmonary disease, unspecified: Secondary | ICD-10-CM

## 2018-02-15 DIAGNOSIS — R569 Unspecified convulsions: Secondary | ICD-10-CM

## 2018-02-15 DIAGNOSIS — C641 Malignant neoplasm of right kidney, except renal pelvis: Secondary | ICD-10-CM | POA: Diagnosis not present

## 2018-02-15 DIAGNOSIS — R5381 Other malaise: Secondary | ICD-10-CM

## 2018-02-15 DIAGNOSIS — R0609 Other forms of dyspnea: Secondary | ICD-10-CM

## 2018-02-15 DIAGNOSIS — M1990 Unspecified osteoarthritis, unspecified site: Secondary | ICD-10-CM

## 2018-02-15 DIAGNOSIS — R5382 Chronic fatigue, unspecified: Secondary | ICD-10-CM

## 2018-02-15 DIAGNOSIS — Z7982 Long term (current) use of aspirin: Secondary | ICD-10-CM

## 2018-02-15 DIAGNOSIS — L97529 Non-pressure chronic ulcer of other part of left foot with unspecified severity: Secondary | ICD-10-CM

## 2018-02-15 DIAGNOSIS — I739 Peripheral vascular disease, unspecified: Secondary | ICD-10-CM

## 2018-02-15 DIAGNOSIS — F17210 Nicotine dependence, cigarettes, uncomplicated: Secondary | ICD-10-CM

## 2018-02-15 DIAGNOSIS — I10 Essential (primary) hypertension: Secondary | ICD-10-CM

## 2018-02-15 DIAGNOSIS — R0789 Other chest pain: Secondary | ICD-10-CM | POA: Diagnosis not present

## 2018-02-15 DIAGNOSIS — Z7902 Long term (current) use of antithrombotics/antiplatelets: Secondary | ICD-10-CM

## 2018-02-15 DIAGNOSIS — F418 Other specified anxiety disorders: Secondary | ICD-10-CM

## 2018-02-15 DIAGNOSIS — C787 Secondary malignant neoplasm of liver and intrahepatic bile duct: Secondary | ICD-10-CM | POA: Diagnosis not present

## 2018-02-15 DIAGNOSIS — Z7952 Long term (current) use of systemic steroids: Secondary | ICD-10-CM

## 2018-02-15 DIAGNOSIS — R918 Other nonspecific abnormal finding of lung field: Secondary | ICD-10-CM

## 2018-02-15 LAB — CBC WITH DIFFERENTIAL/PLATELET
Abs Immature Granulocytes: 0.02 10*3/uL (ref 0.00–0.07)
Basophils Absolute: 0 10*3/uL (ref 0.0–0.1)
Basophils Relative: 0 %
Eosinophils Absolute: 0.1 10*3/uL (ref 0.0–0.5)
Eosinophils Relative: 2 %
HCT: 34.2 % — ABNORMAL LOW (ref 39.0–52.0)
Hemoglobin: 10.6 g/dL — ABNORMAL LOW (ref 13.0–17.0)
Immature Granulocytes: 0 %
Lymphocytes Relative: 28 %
Lymphs Abs: 2.2 10*3/uL (ref 0.7–4.0)
MCH: 30.6 pg (ref 26.0–34.0)
MCHC: 31 g/dL (ref 30.0–36.0)
MCV: 98.8 fL (ref 80.0–100.0)
MONOS PCT: 5 %
Monocytes Absolute: 0.4 10*3/uL (ref 0.1–1.0)
NEUTROS PCT: 65 %
Neutro Abs: 4.9 10*3/uL (ref 1.7–7.7)
Platelets: 370 10*3/uL (ref 150–400)
RBC: 3.46 MIL/uL — ABNORMAL LOW (ref 4.22–5.81)
RDW: 14.6 % (ref 11.5–15.5)
WBC: 7.7 10*3/uL (ref 4.0–10.5)
nRBC: 0 % (ref 0.0–0.2)

## 2018-02-15 LAB — COMPREHENSIVE METABOLIC PANEL
ALT: 12 U/L (ref 0–44)
AST: 19 U/L (ref 15–41)
Albumin: 3.7 g/dL (ref 3.5–5.0)
Alkaline Phosphatase: 65 U/L (ref 38–126)
Anion gap: 10 (ref 5–15)
BUN: 30 mg/dL — ABNORMAL HIGH (ref 8–23)
CALCIUM: 9 mg/dL (ref 8.9–10.3)
CO2: 27 mmol/L (ref 22–32)
Chloride: 104 mmol/L (ref 98–111)
Creatinine, Ser: 0.93 mg/dL (ref 0.61–1.24)
GFR calc Af Amer: >60 mL/min (ref >60)
GFR calc non Af Amer: >60 mL/min (ref >60)
GLUCOSE: 118 mg/dL — AB (ref 70–99)
Potassium: 4.5 mmol/L (ref 3.5–5.1)
Sodium: 141 mmol/L (ref 135–145)
Total Bilirubin: 0.3 mg/dL (ref 0.3–1.2)
Total Protein: 7.2 g/dL (ref 6.5–8.1)

## 2018-02-15 MED ORDER — MELOXICAM 7.5 MG PO TABS: 7.5000 mg | ORAL_TABLET | Freq: Every day | ORAL | 1 refills | Status: AC

## 2018-02-15 MED ORDER — CABOZANTINIB S-MALATE 20 MG PO TABS: 20.0000 mg | ORAL_TABLET | Freq: Every day | ORAL | 3 refills | Status: DC

## 2018-02-15 MED ORDER — DIAZEPAM 2 MG PO TABS: 2.0000 mg | ORAL_TABLET | Freq: Two times a day (BID) | ORAL | 0 refills | Status: AC | PRN

## 2018-02-15 MED ORDER — DIAZEPAM 2 MG PO TABS: 2.0000 mg | ORAL_TABLET | Freq: Two times a day (BID) | ORAL | 0 refills | Status: DC | PRN

## 2018-02-15 NOTE — Progress Notes (Signed)
[place] OFFICE PROGRESS NOTE  Patient Care Team: Leone Haven, MD as PCP - General (Family Medicine) Cammie Sickle, MD as Medical Oncologist (Medical Oncology)  Cancer Staging No matching staging information was found for the patient.   Oncology History   # JULY 2017 METASTATIC RCC- GOOD RISK [s/p LUL lung section;Dr.Oaks]; RLL- nodule ~22m  # June 2017- ? Frontal lesion on CT [cannot have MRI]  # RIGHT KIDNEY CANCER [incidental 2012; diverticulitis] pT3a (4.3x4.3x 3.2cm) [[facility] clear cell G-2; Neg margins; May 2012 ]  # July 20th- PAZOPANIB 4 pills/day; Discontinued sec to Elevated LFTs.   # OCT 2017- Start Sunitinib 2w-On; 1 w-OFF; CT NOV 13th- RLL- Improved; no new disease. MARCH 7th CT- CR. SUTENT-HOLD [since July 2018-sec to fatigue/poor tol]  # march 2019- Brain met [s/p resection/ s/p VP shunt; Dr.Jaikumar]; Bx of liver lesion- RCC [[facility]]  # May 12th 2019- Cabo 40 mg/day; SEP 2019- STOPPED SEC to intol [fatigue/legs ulcers]  # OCT 4th 2019- Re-Start Sutent 25 mg- 2w/1w; DEC 23rd CT progression.; stop sutent  # Dec 23rd Start cabo 259mday.   # ? Scleroderma [Almodipine; Dr.Kernodle]- CONTRAINDICATION to IMMUNOTHERAPY; Severe PVD s/p angio & stenting [Dr.Schneir; July 2019] -----------------------------------    DIAGNOSIS: RCC  STAGE:  IV ; GOALS: pallaitive  CURRENT/MOST RECENT THERAPY [ Oct 4th 2019- Sutent]     [facility] right [facility]     INTERVAL HISTORY:  Thomas FELLS89.o.  male pleasant patient above history of kidney cancer metastatic-currently on Sutent is here for follow-up/review the results of the CT scan.  Patient was recently seen in the emergency room for possible panic attack/seizure.  Head CT noncontrast was negative for any acute process.  Patient continues to complain of significant anxiety issues/with episodes of crying.  Patient complains of  worsening pain in his left posterior chest wall.  He continues to have extreme fatigue.  Continues have shortness of breath on exertion.  Chronic mild cough.  Review of Systems  Constitutional: Positive for malaise/fatigue. Negative for chills, diaphoresis, fever and weight loss.  HENT: Negative for nosebleeds and sore throat.   Eyes: Negative for double vision.  Respiratory: Positive for shortness of breath. Negative for cough, hemoptysis, sputum production and wheezing.   Cardiovascular: Negative for chest pain, palpitations, orthopnea and leg swelling.  Gastrointestinal: Negative for abdominal pain, blood in stool, constipation, diarrhea, heartburn, melena, nausea and vomiting.  Genitourinary: Negative for dysuria, frequency and urgency.  Musculoskeletal: Positive for joint pain.  Skin: Negative.  Negative for itching and rash.  Neurological: Negative for dizziness, tingling, focal weakness, weakness and headaches.  Endo/Heme/Allergies: Does not bruise/bleed easily.  Psychiatric/Behavioral: The patient does not have insomnia.       PAST MEDICAL HISTORY :  Past Medical History:  Diagnosis Date  . Allergic rhinitis   . Anxiety   . Arthritis   . Asthma   . Chronic bronchitis ([place]  . Colitis    Had blood in his stool with this and treated in the hospital  . Colitis cystica profunda   . Collagen vascular disease ([place]  . Colon polyps   . Complication of anesthesia    when waking up get claustophobic with mask, anxiety, panic  . Depression   . Diverticulitis   . ED (erectile dysfunction)   . Genital herpes   . Hypertension   . Kidney disease   . Lytic lesion  of bone on x-ray   . Medical history non-contributory   . Occipital lymphadenopathy   . Peripheral vascular disease ([place])   . Renal cancer ([place])   . Shortness of breath dyspnea     PAST SURGICAL HISTORY :   Past Surgical History:  Procedure Laterality Date  . APPENDECTOMY  1966  . COLON SURGERY    . IR  GASTROSTOMY TUBE REMOVAL  09/02/2017  . KIDNEY SURGERY Right 2012   Nephrectomy  . LOWER EXTREMITY ANGIOGRAPHY Left 08/25/2017   Procedure: LOWER EXTREMITY ANGIOGRAPHY;  Surgeon: Katha Cabal, MD;  Location: [place] CV LAB;  Service: Cardiovascular;  Laterality: Left;  . LOWER EXTREMITY ANGIOGRAPHY Left 01/01/2018   Procedure: LOWER EXTREMITY ANGIOGRAPHY;  Surgeon: Katha Cabal, MD;  Location: [place] CV LAB;  Service: Cardiovascular;  Laterality: Left;  . PARTIAL COLECTOMY  2012   For perforated colon related to diverticulitis- Dr. Marina Gravel  . VASECTOMY  2000  . VIDEO ASSISTED THORACOSCOPY (VATS)/THOROCOTOMY Left 08/13/2015   Procedure: LEFT THOROCOTOMY WITH LEFT UPPER LOBECTOMY, PREOP BRONCHOSCOPY;  Surgeon: Nestor Lewandowsky, MD;  Location: [facility] ORS;  Service: General;  Laterality: Left;    FAMILY HISTORY :   Family History  Problem Relation Age of Onset  . Arthritis/Rheumatoid Other        Parent  . Heart disease Other        Grandparent  . Alcoholism Other        Other relative  . Arthritis Mother        Rhematoid    SOCIAL HISTORY:   Social History   Tobacco Use  . Smoking status: Current Every Day Smoker    Packs/day: 0.25    Years: 30.00    Pack years: 7.50    Types: Cigarettes  . Smokeless tobacco: Never Used  Substance Use Topics  . Alcohol use: Yes    Alcohol/week: 5.0 standard drinks    Types: 5 Cans of beer per week    Comment: 5 beers a night   . Drug use: Yes    Types: Marijuana    ALLERGIES:  is allergic to iodinated diagnostic agents.  MEDICATIONS:  Current Outpatient Medications  Medication Sig Dispense Refill  . amLODipine (NORVASC) 5 MG tablet Take 5 mg by mouth daily at 3 pm.   1  . aspirin EC 81 MG tablet Take 81 mg by mouth daily at 3 pm.    . clopidogrel (PLAVIX) 75 MG tablet Take 1 tablet (75 mg total) by mouth daily. 30 tablet 3  . diphenhydrAMINE (BENADRYL) 25 MG tablet Take 1 tablet (25 mg total) by mouth once for 1 dose.  Take 1 hour prior to ct scan 1 tablet 0  . divalproex (DEPAKOTE) 250 MG DR tablet Take 1 tablet by mouth 2 (two) times daily.    . meloxicam (MOBIC) 7.5 MG tablet Take 1 tablet (7.5 mg total) by mouth daily at 3 pm. 30 tablet 1  . oxyCODONE (OXY IR/ROXICODONE) 5 MG immediate release tablet Take 1 tablet (5 mg total) by mouth every 12 (twelve) hours as needed for severe pain. 30 tablet 0  . urea (CARMOL) 40 % CREA Apply 1 application topically daily as needed (to affected areas of feet.).     Marland Kitchen cabozantinib (CABOMETYX) 20 MG tablet Take 1 tablet (20 mg total) by mouth daily. Take on an empty stomach, 1 hour before or 2 hours after meals. 30 tablet 3  . diazepam (VALIUM) 2 MG tablet Take 1 tablet (  2 mg total) by mouth every 12 (twelve) hours as needed. 45 tablet 0  . predniSONE (DELTASONE) 50 MG tablet Take 1 tablet-13 hours, then 7 hours, and again & 1 hour prior to ct scan (Patient not taking: Reported on 02/15/2018) 3 tablet 0  . ranitidine (ZANTAC) 150 MG tablet Take 1 tablet (150 mg total) by mouth once for 1 dose. Take 1 tablet 1 hr prior to the procedure. 1 tablet 0  . SUNItinib (SUTENT) 25 MG capsule Take 1 capsule (25 mg total) by mouth daily. Take for 14 days on, then 7 days off. Repeat every 21 days. (Patient not taking: Reported on 02/15/2018) 14 capsule 4   No current facility-administered medications for this visit.     PHYSICAL EXAMINATION: ECOG PERFORMANCE STATUS: 2 - Symptomatic, <50% confined to bed  BP 97/67 (BP Location: Left Arm, Patient Position: Sitting)   Pulse 78   Temp 97.6 F (36.4 C) (Tympanic)   Resp 18   Ht '6\' 1"'$  (1.854 m)   Wt 142 lb (64.4 kg)   BMI 18.73 kg/m   Filed Weights   02/15/18 1357  Weight: 142 lb (64.4 kg)    Physical Exam  Constitutional: He is oriented to person, place, and time.  Thin built cachectic appearing male patient.  He is walking by himself.  Accompanied by his wife.  HENT:  Head: Normocephalic and atraumatic.  Mouth/Throat:  Oropharynx is clear and moist. No oropharyngeal exudate.  Eyes: Pupils are equal, round, and reactive to light.  Neck: Normal range of motion. Neck supple.  Cardiovascular: Normal rate and regular rhythm.  Pulmonary/Chest: No respiratory distress. He has no wheezes.  Decreased breath sounds bilaterally.  Abdominal: Soft. Bowel sounds are normal. He exhibits no distension and no mass. There is no abdominal tenderness. There is no rebound and no guarding.  Musculoskeletal: Normal range of motion.        General: No tenderness or edema.  Neurological: He is alert and oriented to person, place, and time.  Skin: Skin is warm.  bil Toe ulceration.  Desquamation of the skin; toe ulceration.  Psychiatric: Affect normal.       LABORATORY DATA:  I have reviewed the data as listed    Component Value Date/Time   NA 141 02/15/2018 1340   NA 137 12/08/2012 0529   K 4.5 02/15/2018 1340   K 4.0 12/08/2012 0529   CL 104 02/15/2018 1340   CL 106 12/08/2012 0529   CO2 27 02/15/2018 1340   CO2 24 12/08/2012 0529   GLUCOSE 118 (H) 02/15/2018 1340   GLUCOSE 76 12/08/2012 0529   BUN 30 (H) 02/15/2018 1340   BUN 6 (L) 12/08/2012 0529   CREATININE 0.93 02/15/2018 1340   CREATININE 1.11 12/08/2012 0529   CALCIUM 9.0 02/15/2018 1340   CALCIUM 8.1 (L) 12/08/2012 0529   PROT 7.2 02/15/2018 1340   PROT 6.9 12/06/2012 0901   ALBUMIN 3.7 02/15/2018 1340   ALBUMIN 3.5 12/06/2012 0901   AST 19 02/15/2018 1340   AST 13 (L) 12/06/2012 0901   ALT 12 02/15/2018 1340   ALT 15 12/06/2012 0901   ALKPHOS 65 02/15/2018 1340   ALKPHOS 73 12/06/2012 0901   BILITOT 0.3 02/15/2018 1340   BILITOT 0.8 12/06/2012 0901   GFRNONAA >60 02/15/2018 1340   GFRNONAA >60 12/08/2012 0529   GFRAA >60 02/15/2018 1340   GFRAA >60 12/08/2012 0529    No results found for: SPEP, UPEP  Lab Results  Component Value Date  WBC 7.7 02/15/2018   NEUTROABS 4.9 02/15/2018   HGB 10.6 (L) 02/15/2018   HCT 34.2 (L) 02/15/2018    MCV 98.8 02/15/2018   PLT 370 02/15/2018      Chemistry      Component Value Date/Time   NA 141 02/15/2018 1340   NA 137 12/08/2012 0529   K 4.5 02/15/2018 1340   K 4.0 12/08/2012 0529   CL 104 02/15/2018 1340   CL 106 12/08/2012 0529   CO2 27 02/15/2018 1340   CO2 24 12/08/2012 0529   BUN 30 (H) 02/15/2018 1340   BUN 6 (L) 12/08/2012 0529   CREATININE 0.93 02/15/2018 1340   CREATININE 1.11 12/08/2012 0529      Component Value Date/Time   CALCIUM 9.0 02/15/2018 1340   CALCIUM 8.1 (L) 12/08/2012 0529   ALKPHOS 65 02/15/2018 1340   ALKPHOS 73 12/06/2012 0901   AST 19 02/15/2018 1340   AST 13 (L) 12/06/2012 0901   ALT 12 02/15/2018 1340   ALT 15 12/06/2012 0901   BILITOT 0.3 02/15/2018 1340   BILITOT 0.8 12/06/2012 0901       RADIOGRAPHIC STUDIES: I have personally reviewed the radiological images as listed and agreed with the findings in the report. No results found.   ASSESSMENT & PLAN:  Primary cancer of right kidney with metastasis from kidney to other site [facility]) #Clear cell kidney cancer; stage IV- CT chest dec 2019-C/P-progressive bilateral lung nodules/new; progressive liver lesions.   #Discontinue Sutent; recommend starting Cabo 20 mg once a day.  Patient has previous intolerance to higher doses.  Again reviewed the potential side effects. After lengthy discussion weighing risk versus benefits patient agrees to proceed with treatment as discussed above.    #Left posterior chest wall pain-continue lytic lesions in the bone/rib metastases.  Referral to radiation oncology ASAP.  Continue Mobic.  # Bil Foot pain- PVD/ scleroderma/ smoking/non-healing wounds of Bil LE- continue oxycodone twice daily as needed.   # Seizures- 11/23- CT brain-NEG for acute process.  Currently on Depakote.  # COPD/smoking- unfortunately, continues to smoke.  Stable  # Anxiety/ depression- not well controlled; recommend valium 5 mg twice daily prn at night.  Patient had appointment  with palliative care this afternoon.  Wants to hold off until next visit in 3 weeks.  # Disposition: # referral to Dr.Crystal asap for left rib lesion/pain # Palliative care referral in 3 weeks # Follow up with MD/labs-cbc/cmp- 3 week  # I reviewed the blood work- with the patient in detail; also reviewed the imaging independently [as summarized above]; and with the patient in detail.   # 40 minutes face-to-face with the patient discussing the above plan of care; more than 50% of time spent on prognosis/ natural history; counseling and coordination.     No orders of the defined types were placed in this encounter.  All questions were answered. The patient knows to call the clinic with any problems, questions or concerns.      Cammie Sickle, MD 02/15/2018 4:28 PM

## 2018-02-15 NOTE — Telephone Encounter (Signed)
Incoming call from Patients' wife stating that they just returned from Dr. Rogue Bussing office and Husband was given a RX of low dose valium.  Will try for a week and let Dr.  Biagio Quint know how her husband did.

## 2018-02-15 NOTE — Assessment & Plan Note (Addendum)
#  Clear cell kidney cancer; stage IV- CT chest dec 2019-C/P-progressive bilateral lung nodules/new; progressive liver lesions.   #Discontinue Sutent; recommend starting Cabo 20 mg once a day.  Patient has previous intolerance to higher doses.  Again reviewed the potential side effects. After lengthy discussion weighing risk versus benefits patient agrees to proceed with treatment as discussed above.    #Left posterior chest wall pain-continue lytic lesions in the bone/rib metastases.  Referral to radiation oncology ASAP.  Continue Mobic.  # Bil Foot pain- PVD/ scleroderma/ smoking/non-healing wounds of Bil LE- continue oxycodone twice daily as needed.   # Seizures- 11/23- CT brain-NEG for acute process.  Currently on Depakote.  # COPD/smoking- unfortunately, continues to smoke.  Stable  # Anxiety/ depression- not well controlled; recommend valium 5 mg twice daily prn at night.  Patient had appointment with palliative care this afternoon.  Wants to hold off until next visit in 3 weeks.  # Disposition: # referral to Dr.Crystal asap for left rib lesion/pain # Palliative care referral in 3 weeks # Follow up with MD/labs-cbc/cmp- 3 week  # I reviewed the blood work- with the patient in detail; also reviewed the imaging independently [as summarized above]; and with the patient in detail.   # 40 minutes face-to-face with the patient discussing the above plan of care; more than 50% of time spent on prognosis/ natural history; counseling and coordination.

## 2018-02-15 NOTE — Progress Notes (Signed)
"  I've been crying a lot and tensed up. "

## 2018-02-15 NOTE — Telephone Encounter (Signed)
Called pt's wife Tamotsu Wiederholt. Left a VM to call back. CRM created and sent to [facility] pool.

## 2018-02-18 ENCOUNTER — Telehealth: Payer: Self-pay | Admitting: Pharmacy Technician

## 2018-02-18 ENCOUNTER — Telehealth: Payer: Self-pay | Admitting: Pharmacist

## 2018-02-18 DIAGNOSIS — C641 Malignant neoplasm of right kidney, except renal pelvis: Secondary | ICD-10-CM

## 2018-02-18 MED ORDER — CABOZANTINIB S-MALATE 20 MG PO TABS: 20.0000 mg | ORAL_TABLET | Freq: Every day | ORAL | 3 refills | Status: AC

## 2018-02-18 MED FILL — CABOMETYX 20 MG TABLET: 20 | 15 days supply | Qty: 15 | Fill #0

## 2018-02-18 NOTE — Telephone Encounter (Signed)
Oral Oncology Patient Advocate Encounter  After completing benefits investigation for Cabometyx, previous prior authorization is still approved until 07/01/2018.  Copay is $0.00.  Scheduled Cabometyx to be filled and mailed today from [facility].  Delivery set for 02/19/18.  Spoke with wife, Santiago Glad.  [place] Patient [place] Phone [phone] Fax [phone] 02/18/2018 2:00 PM

## 2018-02-18 NOTE — Telephone Encounter (Signed)
Oral Chemotherapy Pharmacist Encounter  Patient Education I spoke with patient's wife Thomas Le for overview of new oral chemotherapy medication: Cabometyx (cabozantinib) for the treatment of metastatic RCC, planned duration until disease progression or unacceptable drug toxicity. Patient previously on Sutent but noted to have disease progression on CT. Patient has been on cabozantinib in the past but it was stopped due to intolerance. Patient is being restarted at a low dose to hopefully prevent intolerance.  Counseled Thomas Le on administration, dosing, side effects, monitoring, drug-food interactions, safe handling, storage, and disposal. Patient will take 1 tablet (20 mg total) by mouth daily. Take on an empty stomach, 1 hour before or 2 hours after meals.  Side effects include but not limited to: decreased wbc, hand-foot syndrome, N/V, diarrhea, fatigue, and mouth sores.    Reviewed with Thomas Le importance of keeping a medication schedule and plan for any missed doses.  Thomas Le voiced understanding and appreciation. All questions answered.  Provided patient with [facility] phone number. Patient knows to call the office with questions or concerns. Oral Chemotherapy Navigation Clinic will continue to follow.  Darl Pikes, PharmD, BCPS, [facility] Hematology/Oncology Clinical Pharmacist [facility]/AP Oral [place] [facility] [phone]  02/18/2018 1:59 PM

## 2018-02-18 NOTE — Telephone Encounter (Signed)
Oral Oncology Pharmacist Encounter  Received new prescription for Cabometyx (cabozantinib) for the treatment of metastatic RCC, planned duration until disease progression or unacceptable drug toxicity. Patient previously on Sutent but noted to have disease progression on CT  CMP from 02/15/18 assessed, no relevant lab abnormalities. BP from 02/15/18 not elevated, continue to monitor once cabozantinib is started. Prescription dose and frequency assessed. Patient has been on cabozantinib in the past but it was stopped due to intolerance. Patient is being restarted at a low dose to hopefully prevent intolerance.  Current medication list in Epic reviewed, no DDIs with cabozantinib identified.  Prescription has been e-scribed to the [facility] for benefits analysis and approval.  Oral Oncology Clinic will continue to follow for insurance authorization, copayment issues, initial counseling and start date.  Darl Pikes, PharmD, BCPS, [facility] Hematology/Oncology Clinical Pharmacist [facility]/AP Oral [place] [facility] [phone]  02/18/2018 12:30 PM

## 2018-02-19 NOTE — Telephone Encounter (Signed)
Noted.  Please inform the patient's wife that the meloxicam was sent to the pharmacy by his oncologist on 02/15/2018.  Choosing an antidepressant would be complicated by the meloxicam.  It may be worthwhile seeing how he does with the Valium in combination with his Depakote prior to considering adding any alternative medications.

## 2018-02-19 NOTE — Telephone Encounter (Signed)
Called and spoke with patient's wife. Wife advised and voiced understanding. Pt's wife stated that YES he is still taking the [facility] and it has been helpful for him and he only has 1 pill left will need this refilled.   Last refilled 02/15/2018 disp 30 with 1 refill wife must NOT know this has been refilled.   Plus she stated that his other doctor did prescribed him a low dose valium for his anxiety.   Sent to PCP

## 2018-02-19 NOTE — Telephone Encounter (Signed)
I spoke with patient's wife & she was going to contact [facility] to make sure they had meloxicam prescription. She was advised & verbalized understanding on below.

## 2018-02-22 ENCOUNTER — Ambulatory Visit: Payer: BLUE CROSS/BLUE SHIELD | Admitting: Radiation Oncology

## 2018-03-03 ENCOUNTER — Telehealth: Payer: Self-pay | Admitting: Pharmacy Technician

## 2018-03-03 NOTE — Telephone Encounter (Addendum)
Oral Oncology Patient Advocate Encounter  Prior Authorization for Cabometyx has been approved.    PA# [id number] Effective dates: 03/03/2018 through 09/01/2018  Patients co-pay is $1400 (15 day supply per insurance).  Obtained a copay card to reduce out of pocket expense to $0.  Oral Oncology Clinic will continue to follow.   [place] Patient [place] Phone [phone] Fax [phone] 03/03/2018 4:28 PM

## 2018-03-03 NOTE — Telephone Encounter (Signed)
Oral Oncology Patient Advocate Encounter   Was successful in obtaining a copay card for Cabometyx.  This copay card will make the patients copay $0.00.  I have spoken with the patient.    The billing information is as follows and has been shared with [place].   RxBin: [id number] PCN: Loyalty Member ID: [id number] Group ID: [id number]   Thomas Le [place] [name] Phone [phone] Fax [phone] 03/03/2018 4:29 PM

## 2018-03-03 NOTE — Telephone Encounter (Signed)
Oral Oncology Patient Advocate Encounter  Received notification from [employer] [place] that prior authorization for Cabometyx is required.  PA submitted on CoverMyMeds Key [id number] Status is pending  Oral Oncology Clinic will continue to follow.  [place] Patient [place] Phone [phone] Fax [phone] 03/03/2018 3:11 PM

## 2018-03-04 ENCOUNTER — Other Ambulatory Visit: Payer: Self-pay | Admitting: Internal Medicine

## 2018-03-04 DIAGNOSIS — C641 Malignant neoplasm of right kidney, except renal pelvis: Secondary | ICD-10-CM

## 2018-03-06 ENCOUNTER — Emergency Department: Payer: PRIVATE HEALTH INSURANCE

## 2018-03-06 ENCOUNTER — Ambulatory Visit (HOSPITAL_COMMUNITY)
Admission: AD | Admit: 2018-03-06 | Discharge: 2018-03-06 | Disposition: A | Discharge status: Home / Self Care | Payer: PRIVATE HEALTH INSURANCE | Source: Other Acute Inpatient Hospital | Attending: Emergency Medicine | Admitting: Emergency Medicine

## 2018-03-06 ENCOUNTER — Emergency Department
Admission: EM | Admit: 2018-03-06 | Discharge: 2018-03-06 | Disposition: A | Discharge status: Other Acute Inpatient Hospital | Payer: PRIVATE HEALTH INSURANCE | Attending: Emergency Medicine | Admitting: Emergency Medicine

## 2018-03-06 DIAGNOSIS — W19XXXA Unspecified fall, initial encounter: Secondary | ICD-10-CM

## 2018-03-06 DIAGNOSIS — Z7982 Long term (current) use of aspirin: Secondary | ICD-10-CM | POA: Insufficient documentation

## 2018-03-06 DIAGNOSIS — Z7901 Long term (current) use of anticoagulants: Secondary | ICD-10-CM | POA: Insufficient documentation

## 2018-03-06 DIAGNOSIS — I629 Nontraumatic intracranial hemorrhage, unspecified: Secondary | ICD-10-CM | POA: Diagnosis not present

## 2018-03-06 DIAGNOSIS — I10 Essential (primary) hypertension: Secondary | ICD-10-CM | POA: Diagnosis not present

## 2018-03-06 DIAGNOSIS — I609 Nontraumatic subarachnoid hemorrhage, unspecified: Secondary | ICD-10-CM | POA: Insufficient documentation

## 2018-03-06 DIAGNOSIS — F17210 Nicotine dependence, cigarettes, uncomplicated: Secondary | ICD-10-CM | POA: Insufficient documentation

## 2018-03-06 DIAGNOSIS — J45909 Unspecified asthma, uncomplicated: Secondary | ICD-10-CM | POA: Diagnosis not present

## 2018-03-06 DIAGNOSIS — R4182 Altered mental status, unspecified: Secondary | ICD-10-CM

## 2018-03-06 DIAGNOSIS — Z85528 Personal history of other malignant neoplasm of kidney: Secondary | ICD-10-CM | POA: Insufficient documentation

## 2018-03-06 DIAGNOSIS — Z85841 Personal history of malignant neoplasm of brain: Secondary | ICD-10-CM | POA: Diagnosis not present

## 2018-03-06 DIAGNOSIS — Z79899 Other long term (current) drug therapy: Secondary | ICD-10-CM | POA: Insufficient documentation

## 2018-03-06 LAB — BLOOD GAS, ARTERIAL
Acid-base deficit: 2.6 mmol/L — ABNORMAL HIGH (ref 0.0–2.0)
Bicarbonate: 21.8 mmol/L (ref 20.0–28.0)
FIO2: 100
MECHVT: 500 mL
Mechanical Rate: 18
O2 Saturation: 99.9 %
PATIENT TEMPERATURE: 37
PEEP: 5 cmH2O
PO2 ART: 339 mmHg — AB (ref 83.0–108.0)
pCO2 arterial: 36 mmHg (ref 32.0–48.0)
pH, Arterial: 7.39 (ref 7.350–7.450)

## 2018-03-06 LAB — COMPREHENSIVE METABOLIC PANEL
ALT: 12 U/L (ref 0–44)
AST: 21 U/L (ref 15–41)
Albumin: 3.9 g/dL (ref 3.5–5.0)
Alkaline Phosphatase: 73 U/L (ref 38–126)
Anion gap: 9 (ref 5–15)
BUN: 27 mg/dL — ABNORMAL HIGH (ref 8–23)
CO2: 26 mmol/L (ref 22–32)
Calcium: 8.4 mg/dL — ABNORMAL LOW (ref 8.9–10.3)
Chloride: 104 mmol/L (ref 98–111)
Creatinine, Ser: 0.9 mg/dL (ref 0.61–1.24)
GFR calc Af Amer: >60 mL/min (ref >60)
GFR calc non Af Amer: >60 mL/min (ref >60)
GLUCOSE: 91 mg/dL (ref 70–99)
Potassium: 4.3 mmol/L (ref 3.5–5.1)
SODIUM: 139 mmol/L (ref 135–145)
Total Bilirubin: 0.4 mg/dL (ref 0.3–1.2)
Total Protein: 7.8 g/dL (ref 6.5–8.1)

## 2018-03-06 LAB — CBC
HCT: 34.6 % — ABNORMAL LOW (ref 39.0–52.0)
Hemoglobin: 10.5 g/dL — ABNORMAL LOW (ref 13.0–17.0)
MCH: 29.4 pg (ref 26.0–34.0)
MCHC: 30.3 g/dL (ref 30.0–36.0)
MCV: 96.9 fL (ref 80.0–100.0)
Platelets: 328 10*3/uL (ref 150–400)
RBC: 3.57 MIL/uL — ABNORMAL LOW (ref 4.22–5.81)
RDW: 15.4 % (ref 11.5–15.5)
WBC: 7.6 10*3/uL (ref 4.0–10.5)
nRBC: 0 % (ref 0.0–0.2)

## 2018-03-06 LAB — URINALYSIS, COMPLETE (UACMP) WITH MICROSCOPIC
Bilirubin Urine: NEGATIVE
Glucose, UA: 50 mg/dL — AB
Ketones, ur: NEGATIVE mg/dL
Leukocytes, UA: NEGATIVE
Nitrite: NEGATIVE
PH: 5 (ref 5.0–8.0)
Protein, ur: 100 mg/dL — AB
SPECIFIC GRAVITY, URINE: 1.009 (ref 1.005–1.030)

## 2018-03-06 LAB — VALPROIC ACID LEVEL: Valproic Acid Lvl: 47 ug/mL — ABNORMAL LOW (ref 50.0–100.0)

## 2018-03-06 MED ORDER — NOREPINEPHRINE-SODIUM CHLORIDE 4-0.9 MG/250ML-% IV SOLN
10.0000 ug/min | INTRAVENOUS | Status: DC
  Administered 2018-03-06: 10 ug/min via INTRAVENOUS

## 2018-03-06 MED ORDER — NOREPINEPHRINE-SODIUM CHLORIDE 4-0.9 MG/250ML-% IV SOLN
INTRAVENOUS | Status: AC
  Filled 2018-03-06: qty 250

## 2018-03-06 MED ORDER — SODIUM CHLORIDE 0.9 % IV BOLUS
1000.0000 mL | Freq: Once | INTRAVENOUS | Status: AC
  Administered 2018-03-06: 1000 mL via INTRAVENOUS

## 2018-03-06 MED ORDER — ETOMIDATE 2 MG/ML IV SOLN
10.0000 mg | Freq: Once | INTRAVENOUS | Status: AC
  Administered 2018-03-06: 10 mg via INTRAVENOUS

## 2018-03-06 MED ORDER — LEVETIRACETAM IN NACL 1000 MG/100ML IV SOLN
1000.0000 mg | Freq: Once | INTRAVENOUS | Status: AC
  Administered 2018-03-06: 1000 mg via INTRAVENOUS
  Filled 2018-03-06: qty 100

## 2018-03-06 MED ORDER — CHLORHEXIDINE GLUCONATE 0.12 % MT SOLN
5.00 | OROMUCOSAL | Status: DC
Start: 2018-03-06 — End: 2018-03-06

## 2018-03-06 MED ORDER — PROPOFOL 100 MG/10ML IV EMUL
0.00 | INTRAVENOUS | Status: DC
Start: ? — End: 2018-03-06

## 2018-03-06 MED ORDER — DOCUSATE SODIUM 150 MG/15ML PO LIQD
100.00 | ORAL | Status: DC
Start: 2018-03-08 — End: 2018-03-06

## 2018-03-06 MED ORDER — ROCURONIUM BROMIDE 50 MG/5ML IV SOLN
100.0000 mg | Freq: Once | INTRAVENOUS | Status: AC
  Administered 2018-03-06: 100 mg via INTRAVENOUS

## 2018-03-06 MED ORDER — PROPOFOL 1000 MG/100ML IV EMUL
10.0000 ug/kg/min | INTRAVENOUS | Status: DC
  Administered 2018-03-06: 10 ug/kg/min via INTRAVENOUS

## 2018-03-06 MED ORDER — SUCCINYLCHOLINE CHLORIDE 20 MG/ML IJ SOLN
120.0000 mg | Freq: Once | INTRAMUSCULAR | Status: AC
  Administered 2018-03-06: 120 mg via INTRAVENOUS

## 2018-03-06 MED ORDER — GENERIC EXTERNAL MEDICATION
.00 | Status: DC
Start: ? — End: 2018-03-06

## 2018-03-06 MED ORDER — DEXTROSE 10 % IV SOLN
12.50 | INTRAVENOUS | Status: DC
Start: ? — End: 2018-03-06

## 2018-03-06 MED ORDER — INSULIN REGULAR HUMAN 100 UNIT/ML IJ SOLN
0.00 | INTRAMUSCULAR | Status: DC
Start: 2018-03-08 — End: 2018-03-06

## 2018-03-06 MED ORDER — FAMOTIDINE 20 MG PO TABS
20.00 | ORAL_TABLET | ORAL | Status: DC
Start: 2018-03-08 — End: 2018-03-06

## 2018-03-06 MED ORDER — PROPOFOL 1000 MG/100ML IV EMUL
INTRAVENOUS | Status: AC
  Filled 2018-03-06: qty 100

## 2018-03-06 NOTE — ED Notes (Signed)
Patient transported to CT 

## 2018-03-06 NOTE — ED Provider Notes (Signed)
[facility] Emergency Department Provider Note    First MD Initiated Contact with Patient 03/06/18 0209     (approximate)  I have reviewed the triage vital signs and the nursing notes.  Level 5 caveat: History and review of system limited secondary to altered mental status HISTORY  Chief Complaint Fall and Altered Mental Status    HPI Thomas Le is a 63 y.o. male with below list of chronic medical conditions including "brain cancer" per EMS presents to the emergency department with report of altered mental status.  Patient's son states that he heard a banging sound from his father's room and on his arrival to the room father's feet were on the floor so he suspected that he hit his foot against the floor however patient was in the bed.  Patient son states that his father was minimally responsive to verbal noxious stimuli.  EMS states that the patient was only responsive to noxious stimuli on their arrival.  Patient's wife states that the patient has been admitting to a headache "all day today.  Past Medical History:  Diagnosis Date  . Allergic rhinitis   . Anxiety   . Arthritis   . Asthma   . Chronic bronchitis ([place])   . Colitis    Had blood in his stool with this and treated in the hospital  . Colitis cystica profunda   . Collagen vascular disease ([place])   . Colon polyps   . Complication of anesthesia    when waking up get claustophobic with mask, anxiety, panic  . Depression   . Diverticulitis   . ED (erectile dysfunction)   . Genital herpes   . Hypertension   . Kidney disease   . Lytic lesion of bone on x-ray   . Medical history non-contributory   . Occipital lymphadenopathy   . Peripheral vascular disease ([place])   . Renal cancer ([place])   . Shortness of breath dyspnea     Patient Active Problem List   Diagnosis Date Noted  . Essential hypertension 01/20/2018  . Hyperlipidemia 01/20/2018  . Demand ischemia ([place]) 12/21/2017  . Sleeping  difficulty 12/03/2017  . Elevated troponin 12/03/2017  . History of brain shunt 10/24/2017  . Atherosclerosis of native arteries of the extremities with ulceration ([place]) 10/07/2017  . COPD (chronic obstructive pulmonary disease) ([place]) 10/07/2017  . Skin ulcer of toe of right foot with fat layer exposed ([place]) 08/10/2017  . Tobacco abuse 07/07/2017  . Goals of care, counseling/discussion 07/05/2017  . Allergic rhinitis 11/15/2015  . Primary cancer of right kidney with metastasis from kidney to other site [facility]) 08/30/2015  . Lung mass 08/13/2015  . Erectile dysfunction 06/14/2015  . Occipital lymphadenopathy 06/14/2015  . Anxiety and depression 05/14/2015  . Bilateral hand pain 05/14/2015  . Scleroderma ([place]) 05/14/2015  . Diarrhea 05/14/2015    Past Surgical History:  Procedure Laterality Date  . APPENDECTOMY  1966  . COLON SURGERY    . IR GASTROSTOMY TUBE REMOVAL  09/02/2017  . KIDNEY SURGERY Right 2012   Nephrectomy  . LOWER EXTREMITY ANGIOGRAPHY Left 08/25/2017   Procedure: LOWER EXTREMITY ANGIOGRAPHY;  Surgeon: Katha Cabal, MD;  Location: [place] CV LAB;  Service: Cardiovascular;  Laterality: Left;  . LOWER EXTREMITY ANGIOGRAPHY Left 01/01/2018   Procedure: LOWER EXTREMITY ANGIOGRAPHY;  Surgeon: Katha Cabal, MD;  Location: [place] [facility];  Service: Cardiovascular;  Laterality: Left;  . PARTIAL COLECTOMY  2012   For perforated colon related to  diverticulitis- Dr. Marina Gravel  . VASECTOMY  2000  . VIDEO ASSISTED THORACOSCOPY (VATS)/THOROCOTOMY Left 08/13/2015   Procedure: LEFT THOROCOTOMY WITH LEFT UPPER LOBECTOMY, PREOP BRONCHOSCOPY;  Surgeon: Nestor Lewandowsky, MD;  Location: [facility] ORS;  Service: General;  Laterality: Left;    Prior to Admission medications   Medication Sig Start Date End Date Taking? Authorizing Provider  amLODipine (NORVASC) 5 MG tablet Take 5 mg by mouth daily at 3 pm.  08/18/17   [provider]  aspirin EC 81 MG tablet Take 81 mg by  mouth daily at 3 pm.    [provider]  cabozantinib (CABOMETYX) 20 MG tablet Take 1 tablet (20 mg total) by mouth daily. Take on an empty stomach, 1 hour before or 2 hours after meals. 02/18/18   Cammie Sickle, MD  clopidogrel (PLAVIX) 75 MG tablet Take 1 tablet (75 mg total) by mouth daily. 01/01/18   Schnier, Dolores Lory, MD  diazepam (VALIUM) 2 MG tablet Take 1 tablet (2 mg total) by mouth every 12 (twelve) hours as needed. 02/15/18   Cammie Sickle, MD  diphenhydrAMINE (BENADRYL) 25 MG tablet Take 1 tablet (25 mg total) by mouth once for 1 dose. Take 1 hour prior to ct scan 01/25/18 02/15/18  Cammie Sickle, MD  divalproex (DEPAKOTE) 250 MG DR tablet Take 1 tablet by mouth 2 (two) times daily. 01/20/18 02/19/18  [provider]  meloxicam (MOBIC) 7.5 MG tablet Take 1 tablet (7.5 mg total) by mouth daily at 3 pm. 02/15/18   Cammie Sickle, MD  oxyCODONE (OXY IR/ROXICODONE) 5 MG immediate release tablet Take 1 tablet (5 mg total) by mouth every 12 (twelve) hours as needed for severe pain. 10/21/17   Cammie Sickle, MD  predniSONE (DELTASONE) 50 MG tablet Take 1 tablet-13 hours, then 7 hours, and again & 1 hour prior to ct scan Patient not taking: Reported on 02/15/2018 01/25/18   Cammie Sickle, MD  ranitidine (ZANTAC) 150 MG tablet Take 1 tablet (150 mg total) by mouth once for 1 dose. Take 1 tablet 1 hr prior to the procedure. 01/25/18 01/25/18  Cammie Sickle, MD  urea (CARMOL) 40 % CREA Apply 1 application topically daily as needed (to affected areas of feet.).     [provider]    Allergies Iodinated diagnostic agents  Family History  Problem Relation Age of Onset  . Arthritis/Rheumatoid Other        Parent  . Heart disease Other        Grandparent  . Alcoholism Other        Other relative  . Arthritis Mother        Rhematoid    Social History Social History   Tobacco Use  . Smoking status: Current Every  Day Smoker    Packs/day: 0.25    Years: 30.00    Pack years: 7.50    Types: Cigarettes  . Smokeless tobacco: Never Used  Substance Use Topics  . Alcohol use: Yes    Alcohol/week: 5.0 standard drinks    Types: 5 Cans of beer per week    Comment: 5 beers a night   . Drug use: Yes    Types: Marijuana    Review of Systems Constitutional: No fever/chills Eyes: No visual changes. ENT: No sore throat. Cardiovascular: Denies chest pain. Respiratory: Denies shortness of breath. Gastrointestinal: No abdominal pain.  No nausea, no vomiting.  No diarrhea.  No constipation. Genitourinary: Negative for dysuria. Musculoskeletal: Negative for  neck pain.  Negative for back pain. Integumentary: Negative for rash. Neurological: Positive for altered mental status.  Positive for headache  ____________________________________________   PHYSICAL EXAM:  VITAL SIGNS: ED Triage Vitals  Enc Vitals Group     BP 03/06/18 0208 96/62     Pulse Rate 03/06/18 0208 (!) 59     Resp 03/06/18 0208 15     Temp --      Temp src --      SpO2 03/06/18 0208 98 %     Weight 03/06/18 0212 64.4 kg (141 lb 15.6 oz)     Height 03/06/18 0212 1.854 m (_0 )     Head Circumference --      Peak Flow --      Pain Score --      Pain Loc --      Pain Edu? --      Excl. in [place]? --     Constitutional: Responds to verbal stimuli Eyes: Conjunctivae are normal.  Head: Atraumatic. Mouth/Throat: Mucous membranes are moist.  Oropharynx non-erythematous. Neck: No stridor.   Cardiovascular: Normal rate, regular rhythm. Good peripheral circulation. Grossly normal heart sounds. Respiratory: Normal respiratory effort.  No retractions. Lungs CTAB. Gastrointestinal: Soft and nontender. No distention.  Musculoskeletal: No lower extremity tenderness nor edema. No gross deformities of extremities. Neurologic: Slurred nonsensical speech.  Does not follow commands. Skin:  Skin is warm, dry and intact. No rash  noted.   ____________________________________________   LABS (all labs ordered are listed, but only abnormal results are displayed)  Labs Reviewed  CBC - Abnormal; Notable for the following components:      Result Value   RBC 3.57 (*)    Hemoglobin 10.5 (*)    HCT 34.6 (*)    All other components within normal limits  COMPREHENSIVE METABOLIC PANEL - Abnormal; Notable for the following components:   BUN 27 (*)    Calcium 8.4 (*)    All other components within normal limits  URINALYSIS, COMPLETE (UACMP) WITH MICROSCOPIC - Abnormal; Notable for the following components:   Color, Urine YELLOW (*)    APPearance CLOUDY (*)    Glucose, UA 50 (*)    Hgb urine dipstick SMALL (*)    Protein, ur 100 (*)    Bacteria, UA MANY (*)    All other components within normal limits  VALPROIC ACID LEVEL - Abnormal; Notable for the following components:   Valproic Acid Lvl 47 (*)    All other components within normal limits  BLOOD GAS, ARTERIAL - Abnormal; Notable for the following components:   pO2, Arterial 339 (*)    Acid-base deficit 2.6 (*)    All other components within normal limits    RADIOLOGY I, [place] Jaretzy Lhommedieu, personally viewed and evaluated these images (plain radiographs) as part of my medical decision making, as well as reviewing the written report by the radiologist.  ED MD interpretation: Chest x-ray revealed no active disease per radiologist  CT head revealed acute third ventricle hemorrhage approximately 10 cc distending the ventricle with mild local mass-effect of the surrounding structures.  Official radiology report(s): Dg Chest 1 View  Result Date: 03/06/2018 CLINICAL DATA:  Unwitnessed fall. Altered mental status. History of terminal brain cancer. EXAM: CHEST  1 VIEW COMPARISON:  07/09/2017 FINDINGS: Patient rotation limits examination. Heart size and pulmonary vascularity are probably normal for technique. No definite airspace disease or consolidation in the lungs.  Emphysematous changes in the upper lungs. Catheter projected over the  right mediastinum likely representing a ventricular peritoneal shunt. No pneumothorax. Old left rib fractures. IMPRESSION: No active disease.  Emphysematous changes in the lungs. Electronically Signed   By: Lucienne Capers M.D.   On: 03/06/2018 03:37   Ct Head Wo Contrast  Result Date: 03/06/2018 CLINICAL DATA:  63 y/o  M; found on floor, unwitnessed fall. EXAM: CT HEAD WITHOUT CONTRAST CT CERVICAL SPINE WITHOUT CONTRAST TECHNIQUE: Multidetector CT imaging of the head and cervical spine was performed following the standard protocol without intravenous contrast. Multiplanar CT image reconstructions of the cervical spine were also generated. COMPARISON:  02/10/2018 CT head.  05/06/2017 CT cervical spine. FINDINGS: CT HEAD FINDINGS Brain: Acute hemorrhage centered within the third ventricle measuring 3.2 x 2.1 x 2.8 cm (volume = 9.9 cm^3)(AP x ML x CC series 4, image 11 and series 5, image 31). The ventricle is distended and there is mild mass effect on surrounding structures. Stable size of lateral ventricles with a right parietal approach ventriculostomy catheter extending across epilepsy dumb, tip in the frontal horn of left lateral ventricle. Stable linear track traversing the right frontal lobe from prior catheter. Stable left frontal lobe encephalomalacia likely representing prior ventriculostomy catheter tract. No additional focus of hemorrhage, stroke, or focal mass effect. Vascular: Calcific atherosclerosis of carotid siphons. Skull: Chronic postsurgical changes related to multiple burr holes in the calvarium. Sinuses/Orbits: No acute finding. Other: None. CT CERVICAL SPINE FINDINGS Alignment: Normal. Skull base and vertebrae: No acute fracture. No primary bone lesion or focal pathologic process. Soft tissues and spinal canal: No prevertebral fluid or swelling. No visible canal hematoma. Disc levels: Mild spondylosis of the cervical  spine greatest at the C6-7 level with there is mild loss of intervertebral disc space height and endplate marginal osteophytes. No high-grade bony canal stenosis. Upper chest: Stable mild nodular scarring in the lung apices. Other: Negative. IMPRESSION: CT head: 1. Acute third ventricle hemorrhage, approximately 10 cc, distending the ventricle with mild local mass effect on surrounding structures. Stable lateral ventricle size and position of right parietal approach ventriculostomy catheter. 2. Otherwise stable postsurgical changes of the brain and areas of encephalomalacia from prior catheter tracts. CT cervical spine: 1. No acute fracture or dislocation. 2. Stable mild cervical spondylosis. Critical Value/emergent results were called by telephone at the time of interpretation on 03/06/2018 at 3:44 am to Dr. Marjean Donna , who verbally acknowledged these results. Electronically Signed   By: Kristine Garbe M.D.   On: 03/06/2018 03:46   Ct Cervical Spine Wo Contrast  Result Date: 03/06/2018 CLINICAL DATA:  63 y/o  M; found on floor, unwitnessed fall. EXAM: CT HEAD WITHOUT CONTRAST CT CERVICAL SPINE WITHOUT CONTRAST TECHNIQUE: Multidetector CT imaging of the head and cervical spine was performed following the standard protocol without intravenous contrast. Multiplanar CT image reconstructions of the cervical spine were also generated. COMPARISON:  02/10/2018 CT head.  05/06/2017 CT cervical spine. FINDINGS: CT HEAD FINDINGS Brain: Acute hemorrhage centered within the third ventricle measuring 3.2 x 2.1 x 2.8 cm (volume = 9.9 cm^3)(AP x ML x CC series 4, image 11 and series 5, image 31). The ventricle is distended and there is mild mass effect on surrounding structures. Stable size of lateral ventricles with a right parietal approach ventriculostomy catheter extending across epilepsy dumb, tip in the frontal horn of left lateral ventricle. Stable linear track traversing the right frontal lobe from prior  catheter. Stable left frontal lobe encephalomalacia likely representing prior ventriculostomy catheter tract. No additional focus of hemorrhage,  stroke, or focal mass effect. Vascular: Calcific atherosclerosis of carotid siphons. Skull: Chronic postsurgical changes related to multiple burr holes in the calvarium. Sinuses/Orbits: No acute finding. Other: None. CT CERVICAL SPINE FINDINGS Alignment: Normal. Skull base and vertebrae: No acute fracture. No primary bone lesion or focal pathologic process. Soft tissues and spinal canal: No prevertebral fluid or swelling. No visible canal hematoma. Disc levels: Mild spondylosis of the cervical spine greatest at the C6-7 level with there is mild loss of intervertebral disc space height and endplate marginal osteophytes. No high-grade bony canal stenosis. Upper chest: Stable mild nodular scarring in the lung apices. Other: Negative. IMPRESSION: CT head: 1. Acute third ventricle hemorrhage, approximately 10 cc, distending the ventricle with mild local mass effect on surrounding structures. Stable lateral ventricle size and position of right parietal approach ventriculostomy catheter. 2. Otherwise stable postsurgical changes of the brain and areas of encephalomalacia from prior catheter tracts. CT cervical spine: 1. No acute fracture or dislocation. 2. Stable mild cervical spondylosis. Critical Value/emergent results were called by telephone at the time of interpretation on 03/06/2018 at 3:44 am to Dr. Marjean Donna , who verbally acknowledged these results. Electronically Signed   By: Kristine Garbe M.D.   On: 03/06/2018 03:46   Dg Chest Portable 1 View  Result Date: 03/06/2018 CLINICAL DATA:  63 y/o  M; post intubation and OG tube placement. EXAM: PORTABLE CHEST 1 VIEW COMPARISON:  03/06/2018 chest radiograph. 02/12/2018 CT chest. FINDINGS: Endotracheal tube tip projects 7.3 cm above the carina. Enteric tube tip extends below the field of view into the abdomen.  Stable cardiac silhouette given projection and technique. Calcific aortic atherosclerosis. Emphysema. Left upper lobectomy with decreased volume of left lung compared with right. Pulmonary nodules on prior CT chest are not visualized radiographically. No consolidation, effusion, or pneumothorax. IMPRESSION: Endotracheal tube tip projects 7.3 cm above the carina. Enteric tube tip extends below the field of view into the abdomen. No acute pulmonary process identified. Electronically Signed   By: Kristine Garbe M.D.   On: 03/06/2018 04:59     Critical Care performed:   .Critical Care Performed by: Gregor Hams, MD Authorized by: Gregor Hams, MD   Critical care provider statement:    Critical care time (minutes):  60   Critical care time was exclusive of:  Separately billable procedures and treating other patients and teaching time   Critical care was necessary to treat or prevent imminent or life-threatening deterioration of the following conditions:  Respiratory failure and CNS failure or compromise   Critical care was time spent personally by me on the following activities:  Development of treatment plan with patient or surrogate, discussions with consultants, evaluation of patient's response to treatment, examination of patient, obtaining history from patient or surrogate, ordering and performing treatments and interventions, ordering and review of laboratory studies, ordering and review of radiographic studies, pulse oximetry, re-evaluation of patient's condition and review of old charts   I assumed direction of critical care for this patient from another provider in my specialty: no   Procedure Name: Intubation Date/Time: 03/06/2018 5:55 AM Performed by: Gregor Hams, MD Pre-anesthesia Checklist: Patient identified, Emergency Drugs available, Suction available and Patient being monitored Preoxygenation: Pre-oxygenation with 100% oxygen Induction Type: IV induction and  Rapid sequence Laryngoscope Size: Mac and 4 Tube size: 7.5 mm Number of attempts: 1 Placement Confirmation: ETT inserted through vocal cords under direct vision,  CO2 detector and Breath sounds checked- equal and bilateral Tube secured with:  ETT holder Dental Injury: Teeth and Oropharynx as per pre-operative assessment  Difficulty Due To: Difficulty was anticipated        ____________________________________________   INITIAL IMPRESSION / ASSESSMENT AND PLAN / ED COURSE  As part of my medical decision making, I reviewed the following data within the electronic MEDICAL RECORD NUMBER   63 year old male presenting with above-stated history and physical exam concerning for possible intracranial pathology.  CT scan of the head was performed which revealed hemorrhage in the third ventricle.  Patient's mental status progressively worsened while in the emergency department with patient now more somnolent in comparison to arrival.  Concern for possible inability to to adequately protect airway.  I spoke with the patient's wife and children who agreed to endotracheal intubation.  As a result the patient underwent endotracheal intubation.  Patient discussed with Dr. Kathie Dike neurosurgery at [facility] where the patient had neurosurgery performed January 16 of 2019.  Patient's family also requested for the patient to go to [facility] as he has received his care there.  Dr.Ewend accepted the patient in transfer.  ______________________________  FINAL CLINICAL IMPRESSION(S) / ED DIAGNOSES  Final diagnoses:  Fall  AMS (altered mental status)     MEDICATIONS GIVEN DURING THIS VISIT:  Medications  propofol (DIPRIVAN) 1000 MG/100ML infusion (10 mcg/kg/min  64.4 kg Intravenous New Bag/Given 03/06/18 0435)  norepinephrine (LEVOPHED) 44m in NS 2547mpremix infusion (10 mcg/min Intravenous New Bag/Given 03/06/18 0457)  sodium chloride 0.9 % bolus 1,000 mL (0 mLs Intravenous Stopped 03/06/18 0447)  etomidate (AMIDATE)  injection 10 mg (10 mg Intravenous Given 03/06/18 0417)  succinylcholine (ANECTINE) injection 120 mg (120 mg Intravenous Given 03/06/18 0417)  levETIRAcetam (KEPPRA) IVPB 1000 mg/100 mL premix (0 mg Intravenous Stopped 03/06/18 0523)  rocuronium (ZEMURON) injection 100 mg (100 mg Intravenous Given 03/06/18 0450)     ED Discharge Orders    None       Note:  This document was prepared using Dragon voice recognition software and may include unintentional dictation errors.    BrGregor HamsMD 03/06/18 [phone]

## 2018-03-06 NOTE — ED Notes (Signed)
Warm blankets placed on pt at this time for transport, [facility] RN states unable to use bair hugger in ambulance during transport

## 2018-03-06 NOTE — ED Notes (Signed)
Et tube placement currently 25cm at teeth

## 2018-03-06 NOTE — ED Notes (Signed)
Second bolus NS initiated at time to increase BP

## 2018-03-06 NOTE — ED Notes (Addendum)
Propofol restarted at this time, levophed paused at this time.

## 2018-03-06 NOTE — Progress Notes (Signed)
Post ABG FIO2 TO 45% TOLERATING WELL.

## 2018-03-06 NOTE — ED Notes (Signed)
10 etomidate admin at this time

## 2018-03-06 NOTE — ED Notes (Addendum)
Pt given 50mg  bolus of propofol at this time per MD order

## 2018-03-06 NOTE — ED Notes (Signed)
I have reviewed the EMTALA and it is complete at this time.

## 2018-03-06 NOTE — ED Notes (Signed)
120 succs admin at this time

## 2018-03-06 NOTE — ED Triage Notes (Signed)
Pt arrives via ems from home. EMS states pt has terminal brain cancer. Pt was found on floor by family after an unwitnessed fall. EMS states family reported pt did not wish to have CPR, but does not have DNR form with patient. Pt only responsive to pain per ems. On arrival pt very lethargic, opened eyes when being moved to stretcher but unable to follow commands at this time. EMS states pt had a few moments where he seemed alert but was still confused when attempting to answer questions

## 2018-03-06 NOTE — ED Notes (Signed)
[facility] arrived to transport pt to [facility]

## 2018-03-06 NOTE — ED Notes (Signed)
Report given to [facility] with [facility] at this time. [facility] states the are 25-30 bpm at this time

## 2018-03-06 NOTE — ED Notes (Signed)
Pt able to state "my head hurts" at this time, directly after statement pt unable to answer any other questions. Pt appears distant when eyes are still open.

## 2018-03-08 ENCOUNTER — Inpatient Hospital Stay: Payer: PRIVATE HEALTH INSURANCE | Admitting: Hospice and Palliative Medicine

## 2018-03-08 ENCOUNTER — Inpatient Hospital Stay: Payer: PRIVATE HEALTH INSURANCE | Admitting: Internal Medicine

## 2018-03-08 ENCOUNTER — Inpatient Hospital Stay: Payer: PRIVATE HEALTH INSURANCE

## 2018-03-08 MED ORDER — ENOXAPARIN SODIUM 40 MG/0.4ML ~~LOC~~ SOLN
40.00 | SUBCUTANEOUS | Status: DC
Start: 2018-03-09 — End: 2018-03-08

## 2018-03-08 MED ORDER — GENERIC EXTERNAL MEDICATION
1000.00 | Status: DC
Start: ? — End: 2018-03-08

## 2018-03-08 MED ORDER — HYDROCORTISONE NA SUCCINATE PF 100 MG IJ SOLR
50.00 | INTRAMUSCULAR | Status: DC
Start: 2018-03-08 — End: 2018-03-08

## 2018-03-08 MED ORDER — NICOTINE 14 MG/24HR TD PT24
1.00 | MEDICATED_PATCH | TRANSDERMAL | Status: DC
Start: 2018-03-10 — End: 2018-03-08

## 2018-03-08 MED ORDER — LEVETIRACETAM 100 MG/ML PO SOLN
1000.00 | ORAL | Status: DC
Start: 2018-03-08 — End: 2018-03-08

## 2018-03-08 MED ORDER — ACETAMINOPHEN 325 MG PO TABS
650.00 | ORAL_TABLET | ORAL | Status: DC
Start: ? — End: 2018-03-08

## 2018-03-08 MED ORDER — OXYCODONE HCL 5 MG PO TABS
5.00 | ORAL_TABLET | ORAL | Status: DC
Start: ? — End: 2018-03-08

## 2018-03-08 MED ORDER — HYDRALAZINE HCL 20 MG/ML IJ SOLN
10.00 | INTRAMUSCULAR | Status: DC
Start: ? — End: 2018-03-08

## 2018-03-08 MED ORDER — LACTATED RINGERS IV SOLN
0.00 | INTRAVENOUS | Status: DC
Start: ? — End: 2018-03-08

## 2018-03-08 MED ORDER — GENERIC EXTERNAL MEDICATION
1.00 | Status: DC
Start: 2018-03-09 — End: 2018-03-08

## 2018-03-08 MED ORDER — ONDANSETRON HCL 4 MG/2ML IJ SOLN
4.00 | INTRAMUSCULAR | Status: DC
Start: ? — End: 2018-03-08

## 2018-03-08 NOTE — Assessment & Plan Note (Signed)
#  Clear cell kidney cancer; stage IV- CT chest dec 2019-C/P-progressive bilateral lung nodules/new; progressive liver lesions.   #Discontinue Sutent; recommend starting [place] 20 mg once a day.  Patient has previous intolerance to higher doses.  Again reviewed the potential side effects. After lengthy discussion weighing risk versus benefits patient agrees to proceed with treatment as discussed above.    #Left posterior chest wall pain-continue lytic lesions in the bone/rib metastases.  Referral to radiation oncology ASAP.  Continue Mobic.  # Bil Foot pain- PVD/ scleroderma/ smoking/non-healing wounds of Bil LE- continue oxycodone twice daily as needed.   # Seizures- 11/23- CT brain-NEG for acute process.  Currently on Depakote.  # COPD/smoking- unfortunately, continues to smoke.  Stable  # Anxiety/ depression- not well controlled; recommend valium 5 mg twice daily prn at night.  Patient had appointment with palliative care this afternoon.  Wants to hold off until next visit in 3 weeks.  # Disposition: # referral to Dr.Crystal asap for left rib lesion/pain # Palliative care referral in 3 weeks # Follow up with MD/labs-cbc/cmp- 3 week  # I reviewed the blood work- with the patient in detail; also reviewed the imaging independently [as summarized above]; and with the patient in detail.   # 40 minutes face-to-face with the patient discussing the above plan of care; more than 50% of time spent on prognosis/ natural history; counseling and coordination.

## 2018-03-08 NOTE — Progress Notes (Unsigned)
[place] OFFICE PROGRESS NOTE  Patient Care Team: Leone Haven, MD as PCP - General (Family Medicine) Cammie Sickle, MD as Medical Oncologist (Medical Oncology)  Cancer Staging No matching staging information was found for the patient.   Oncology History   # JULY 2017 METASTATIC RCC- GOOD RISK [s/p LUL lung section;Dr.Oaks]; RLL- nodule ~22m  # June 2017- ? Frontal lesion on CT [cannot have MRI]  # RIGHT KIDNEY CANCER [incidental 2012; diverticulitis] pT3a (4.3x4.3x 3.2cm) [[facility] clear cell G-2; Neg margins; May 2012 ]  # July 20th- PAZOPANIB 4 pills/day; Discontinued sec to Elevated LFTs.   # OCT 2017- Start Sunitinib 2w-On; 1 w-OFF; CT NOV 13th- RLL- Improved; no new disease. MARCH 7th CT- CR. SUTENT-HOLD [since July 2018-sec to fatigue/poor tol]  # march 2019- Brain met [s/p resection/ s/p VP shunt; Dr.Jaikumar]; Bx of liver lesion- RCC [[facility]]  # May 12th 2019- Cabo 40 mg/day; SEP 2019- STOPPED SEC to intol [fatigue/legs ulcers]  # OCT 4th 2019- Re-Start Sutent 25 mg- 2w/1w; DEC 23rd CT progression.; stop sutent  # Dec 23rd Start cabo 259mday.   # ? Scleroderma [Almodipine; Dr.Kernodle]- CONTRAINDICATION to IMMUNOTHERAPY; Severe PVD s/p angio & stenting [Dr.Schneir; July 2019] -----------------------------------    DIAGNOSIS: RCC  STAGE:  IV ; GOALS: pallaitive  CURRENT/MOST RECENT THERAPY [ Oct 4th 2019- Sutent]     [facility] right [facility]     INTERVAL HISTORY:  Thomas FELLS89.o.  male pleasant patient above history of kidney cancer metastatic-currently on Sutent is here for follow-up/review the results of the CT scan.  Patient was recently seen in the emergency room for possible panic attack/seizure.  Head CT noncontrast was negative for any acute process.  Patient continues to complain of significant anxiety issues/with episodes of crying.  Patient complains of  worsening pain in his left posterior chest wall.  He continues to have extreme fatigue.  Continues have shortness of breath on exertion.  Chronic mild cough.  Review of Systems  Constitutional: Positive for malaise/fatigue. Negative for chills, diaphoresis, fever and weight loss.  HENT: Negative for nosebleeds and sore throat.   Eyes: Negative for double vision.  Respiratory: Positive for shortness of breath. Negative for cough, hemoptysis, sputum production and wheezing.   Cardiovascular: Negative for chest pain, palpitations, orthopnea and leg swelling.  Gastrointestinal: Negative for abdominal pain, blood in stool, constipation, diarrhea, heartburn, melena, nausea and vomiting.  Genitourinary: Negative for dysuria, frequency and urgency.  Musculoskeletal: Positive for joint pain.  Skin: Negative.  Negative for itching and rash.  Neurological: Negative for dizziness, tingling, focal weakness, weakness and headaches.  Endo/Heme/Allergies: Does not bruise/bleed easily.  Psychiatric/Behavioral: The patient does not have insomnia.       PAST MEDICAL HISTORY :  Past Medical History:  Diagnosis Date  . Allergic rhinitis   . Anxiety   . Arthritis   . Asthma   . Chronic bronchitis ([place]  . Colitis    Had blood in his stool with this and treated in the hospital  . Colitis cystica profunda   . Collagen vascular disease ([place]  . Colon polyps   . Complication of anesthesia    when waking up get claustophobic with mask, anxiety, panic  . Depression   . Diverticulitis   . ED (erectile dysfunction)   . Genital herpes   . Hypertension   . Kidney disease   . Lytic lesion  of bone on x-ray   . Medical history non-contributory   . Occipital lymphadenopathy   . Peripheral vascular disease ([place])   . Renal cancer ([place])   . Shortness of breath dyspnea     PAST SURGICAL HISTORY :   Past Surgical History:  Procedure Laterality Date  . APPENDECTOMY  1966  . COLON SURGERY    . IR  GASTROSTOMY TUBE REMOVAL  09/02/2017  . KIDNEY SURGERY Right 2012   Nephrectomy  . LOWER EXTREMITY ANGIOGRAPHY Left 08/25/2017   Procedure: LOWER EXTREMITY ANGIOGRAPHY;  Surgeon: Katha Cabal, MD;  Location: [place] [facility];  Service: Cardiovascular;  Laterality: Left;  . LOWER EXTREMITY ANGIOGRAPHY Left 01/01/2018   Procedure: LOWER EXTREMITY ANGIOGRAPHY;  Surgeon: Katha Cabal, MD;  Location: [facility];  Service: Cardiovascular;  Laterality: Left;  . PARTIAL COLECTOMY  2012   For perforated colon related to diverticulitis- Dr. Marina Gravel  . VASECTOMY  2000  . VIDEO ASSISTED THORACOSCOPY (VATS)/THOROCOTOMY Left 08/13/2015   Procedure: LEFT THOROCOTOMY WITH LEFT UPPER LOBECTOMY, PREOP BRONCHOSCOPY;  Surgeon: Nestor Lewandowsky, MD;  Location: [facility];  Service: General;  Laterality: Left;    FAMILY HISTORY :   Family History  Problem Relation Age of Onset  . Arthritis/Rheumatoid Other        Parent  . Heart disease Other        Grandparent  . Alcoholism Other        Other relative  . Arthritis Mother        Rhematoid    SOCIAL HISTORY:   Social History   Tobacco Use  . Smoking status: Current Every Day Smoker    Packs/day: 0.25    Years: 30.00    Pack years: 7.50    Types: Cigarettes  . Smokeless tobacco: Never Used  Substance Use Topics  . Alcohol use: Yes    Alcohol/week: 5.0 standard drinks    Types: 5 Cans of beer per week    Comment: 5 beers a night   . Drug use: Yes    Types: Marijuana    ALLERGIES:  is allergic to iodinated diagnostic agents.  MEDICATIONS:  Current Outpatient Medications  Medication Sig Dispense Refill  . amLODipine (NORVASC) 5 MG tablet Take 5 mg by mouth daily at 3 pm.   1  . aspirin EC 81 MG tablet Take 81 mg by mouth daily at 3 pm.    . cabozantinib (CABOMETYX) 20 MG tablet Take 1 tablet (20 mg total) by mouth daily. Take on an empty stomach, 1 hour before or 2 hours after meals. 30 tablet 3  . clopidogrel (PLAVIX) 75 MG  tablet Take 1 tablet (75 mg total) by mouth daily. 30 tablet 3  . diazepam (VALIUM) 2 MG tablet Take 1 tablet (2 mg total) by mouth every 12 (twelve) hours as needed. 45 tablet 0  . diphenhydrAMINE (BENADRYL) 25 MG tablet Take 1 tablet (25 mg total) by mouth once for 1 dose. Take 1 hour prior to ct scan 1 tablet 0  . divalproex (DEPAKOTE) 250 MG DR tablet Take 1 tablet by mouth 2 (two) times daily.    . meloxicam (MOBIC) 7.5 MG tablet Take 1 tablet (7.5 mg total) by mouth daily at 3 pm. 30 tablet 1  . oxyCODONE (OXY IR/ROXICODONE) 5 MG immediate release tablet Take 1 tablet (5 mg total) by mouth every 12 (twelve) hours as needed for severe pain. 30 tablet 0  . predniSONE (DELTASONE) 50 MG tablet Take  1 tablet-13 hours, then 7 hours, and again & 1 hour prior to ct scan (Patient not taking: Reported on 02/15/2018) 3 tablet 0  . ranitidine (ZANTAC) 150 MG tablet Take 1 tablet (150 mg total) by mouth once for 1 dose. Take 1 tablet 1 hr prior to the procedure. 1 tablet 0  . urea (CARMOL) 40 % CREA Apply 1 application topically daily as needed (to affected areas of feet.).      No current facility-administered medications for this visit.     PHYSICAL EXAMINATION: ECOG PERFORMANCE STATUS: 2 - Symptomatic, <50% confined to bed  There were no vitals taken for this visit.  There were no vitals filed for this visit.  Physical Exam  Constitutional: He is oriented to person, place, and time.  Thin built cachectic appearing male patient.  He is walking by himself.  Accompanied by his wife.  HENT:  Head: Normocephalic and atraumatic.  Mouth/Throat: Oropharynx is clear and moist. No oropharyngeal exudate.  Eyes: Pupils are equal, round, and reactive to light.  Neck: Normal range of motion. Neck supple.  Cardiovascular: Normal rate and regular rhythm.  Pulmonary/Chest: No respiratory distress. He has no wheezes.  Decreased breath sounds bilaterally.  Abdominal: Soft. Bowel sounds are normal. He  exhibits no distension and no mass. There is no abdominal tenderness. There is no rebound and no guarding.  Musculoskeletal: Normal range of motion.        General: No tenderness or edema.  Neurological: He is alert and oriented to person, place, and time.  Skin: Skin is warm.  bil Toe ulceration.  Desquamation of the skin; toe ulceration.  Psychiatric: Affect normal.       LABORATORY DATA:  I have reviewed the data as listed    Component Value Date/Time   NA 139 03/06/2018 0217   NA 137 12/08/2012 0529   K 4.3 03/06/2018 0217   K 4.0 12/08/2012 0529   CL 104 03/06/2018 0217   CL 106 12/08/2012 0529   CO2 26 03/06/2018 0217   CO2 24 12/08/2012 0529   GLUCOSE 91 03/06/2018 0217   GLUCOSE 76 12/08/2012 0529   BUN 27 (H) 03/06/2018 0217   BUN 6 (L) 12/08/2012 0529   CREATININE 0.90 03/06/2018 0217   CREATININE 1.11 12/08/2012 0529   CALCIUM 8.4 (L) 03/06/2018 0217   CALCIUM 8.1 (L) 12/08/2012 0529   PROT 7.8 03/06/2018 0217   PROT 6.9 12/06/2012 0901   ALBUMIN 3.9 03/06/2018 0217   ALBUMIN 3.5 12/06/2012 0901   AST 21 03/06/2018 0217   AST 13 (L) 12/06/2012 0901   ALT 12 03/06/2018 0217   ALT 15 12/06/2012 0901   ALKPHOS 73 03/06/2018 0217   ALKPHOS 73 12/06/2012 0901   BILITOT 0.4 03/06/2018 0217   BILITOT 0.8 12/06/2012 0901   GFRNONAA >60 03/06/2018 0217   GFRNONAA >60 12/08/2012 0529   GFRAA >60 03/06/2018 0217   GFRAA >60 12/08/2012 0529    No results found for: SPEP, UPEP  Lab Results  Component Value Date   WBC 7.6 03/06/2018   NEUTROABS 4.9 02/15/2018   HGB 10.5 (L) 03/06/2018   HCT 34.6 (L) 03/06/2018   MCV 96.9 03/06/2018   PLT 328 03/06/2018      Chemistry      Component Value Date/Time   NA 139 03/06/2018 0217   NA 137 12/08/2012 0529   K 4.3 03/06/2018 0217   K 4.0 12/08/2012 0529   CL 104 03/06/2018 0217   CL 106 12/08/2012  0529   CO2 26 03/06/2018 0217   CO2 24 12/08/2012 0529   BUN 27 (H) 03/06/2018 0217   BUN 6 (L) 12/08/2012  0529   CREATININE 0.90 03/06/2018 0217   CREATININE 1.11 12/08/2012 0529      Component Value Date/Time   CALCIUM 8.4 (L) 03/06/2018 0217   CALCIUM 8.1 (L) 12/08/2012 0529   ALKPHOS 73 03/06/2018 0217   ALKPHOS 73 12/06/2012 0901   AST 21 03/06/2018 0217   AST 13 (L) 12/06/2012 0901   ALT 12 03/06/2018 0217   ALT 15 12/06/2012 0901   BILITOT 0.4 03/06/2018 0217   BILITOT 0.8 12/06/2012 0901       RADIOGRAPHIC STUDIES: I have personally reviewed the radiological images as listed and agreed with the findings in the report. No results found.   ASSESSMENT & PLAN:  No problem-specific Assessment & Plan notes found for this encounter.   No orders of the defined types were placed in this encounter.  All questions were answered. The patient knows to call the clinic with any problems, questions or concerns.      Cammie Sickle, MD 03/08/2018 12:02 PM

## 2018-03-09 ENCOUNTER — Ambulatory Visit: Payer: PRIVATE HEALTH INSURANCE | Attending: Radiation Oncology | Admitting: Radiation Oncology

## 2018-03-09 MED ORDER — HYDRALAZINE HCL 20 MG/ML IJ SOLN
10.00 | INTRAMUSCULAR | Status: DC
Start: ? — End: 2018-03-09

## 2018-03-09 MED ORDER — POLYETHYLENE GLYCOL 3350 17 G PO PACK
17.00 | PACK | ORAL | Status: DC
Start: 2018-03-10 — End: 2018-03-09

## 2018-03-09 MED ORDER — OLANZAPINE 2.5 MG PO TABS
2.50 | ORAL_TABLET | ORAL | Status: DC
Start: 2018-03-09 — End: 2018-03-09

## 2018-03-09 MED ORDER — OXYCODONE HCL 5 MG PO TABS
5.00 | ORAL_TABLET | ORAL | Status: DC
Start: ? — End: 2018-03-09

## 2018-03-09 MED ORDER — INSULIN REGULAR HUMAN 100 UNIT/ML IJ SOLN
0.00 | INTRAMUSCULAR | Status: DC
Start: 2018-03-09 — End: 2018-03-09

## 2018-03-09 MED ORDER — OLANZAPINE 2.5 MG PO TABS
2.50 | ORAL_TABLET | ORAL | Status: DC
Start: ? — End: 2018-03-09

## 2018-03-09 MED ORDER — GENERIC EXTERNAL MEDICATION
2.00 | Status: DC
Start: 2018-03-09 — End: 2018-03-09

## 2018-03-09 MED ORDER — ACETAMINOPHEN 325 MG PO TABS
650.00 | ORAL_TABLET | ORAL | Status: DC
Start: ? — End: 2018-03-09

## 2018-03-09 MED ORDER — LEVETIRACETAM 500 MG PO TABS
1000.00 | ORAL_TABLET | ORAL | Status: DC
Start: 2018-03-09 — End: 2018-03-09

## 2018-03-12 MED ORDER — GENERIC EXTERNAL MEDICATION
Status: DC
Start: ? — End: 2018-03-12

## 2018-03-12 MED ORDER — GLYCOPYRROLATE 0.2 MG/ML IJ SOLN
.20 | INTRAMUSCULAR | Status: DC
Start: ? — End: 2018-03-12

## 2018-03-12 MED ORDER — LORAZEPAM 2 MG/ML IJ SOLN
1.00 | INTRAMUSCULAR | Status: DC
Start: ? — End: 2018-03-12

## 2018-03-15 ENCOUNTER — Telehealth: Payer: Self-pay | Admitting: Internal Medicine

## 2018-03-15 NOTE — Telephone Encounter (Signed)
FYI-discussed with pharmacy will hold any further kidney cancer medication as patient is currently enrolled in hospice as per note from [facility].

## 2018-03-27 DEATH — deceased

## 2018-04-01 ENCOUNTER — Telehealth: Payer: Self-pay

## 2018-04-01 NOTE — Telephone Encounter (Signed)
Copied from [place] [id number]. Topic: General - Deceased Patient >> 30-Apr-2018  2:34 PM Berneta Levins wrote: Reason for CRM:  Crew Goren called and stated that pt passed away 04-10-2018.  Route to department's PEC Pool.

## 2018-04-01 NOTE — Telephone Encounter (Signed)
Is there a way we can Andrell his chart as expired in epic?

## 2018-04-01 NOTE — Telephone Encounter (Signed)
Patient chart updated.

## 2018-04-07 ENCOUNTER — Ambulatory Visit: Payer: BLUE CROSS/BLUE SHIELD | Admitting: Family Medicine

## 2018-04-22 ENCOUNTER — Ambulatory Visit (INDEPENDENT_AMBULATORY_CARE_PROVIDER_SITE_OTHER): Payer: BLUE CROSS/BLUE SHIELD | Admitting: Vascular Surgery

## 2018-04-22 ENCOUNTER — Encounter (INDEPENDENT_AMBULATORY_CARE_PROVIDER_SITE_OTHER): Payer: BLUE CROSS/BLUE SHIELD

## 2019-02-04 IMAGING — CT CT CHEST W/ CM
2 of 3 series · 14 of 36 positions shown, 17 images · IV contrast (omnipaque)
Comparison: CT chest and MR abdomen 06/30/2017.

CLINICAL DATA: Right renal cell carcinoma with pulmonary metastatic
disease. Chest pressure.

EXAM:
CT CHEST WITH CONTRAST
TECHNIQUE: Multidetector CT imaging of the chest was performed during
intravenous contrast administration.
CONTRAST:  75mL OMNIPAQUE IOHEXOL 300 MG/ML  SOLN

[Series 2: axial st · axial · 0.63mm/px · z∈[-686,-322]mm · 11 of 214 slices shown, 14 images]
[im 16/214  mediastinal]
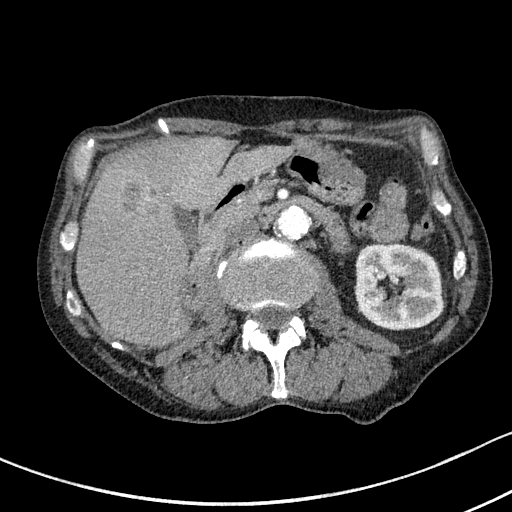
[im 16/214  lung]
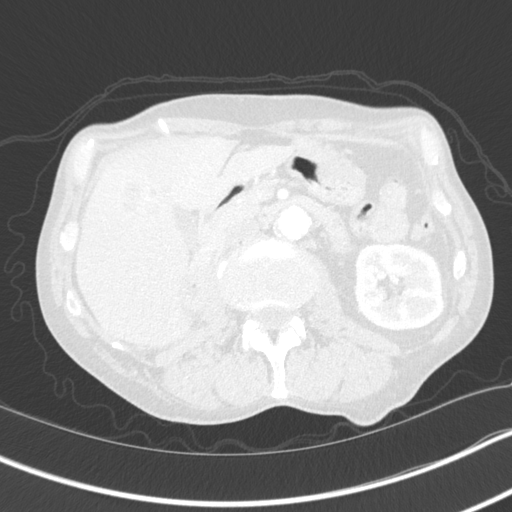
[im 32/214  lung]
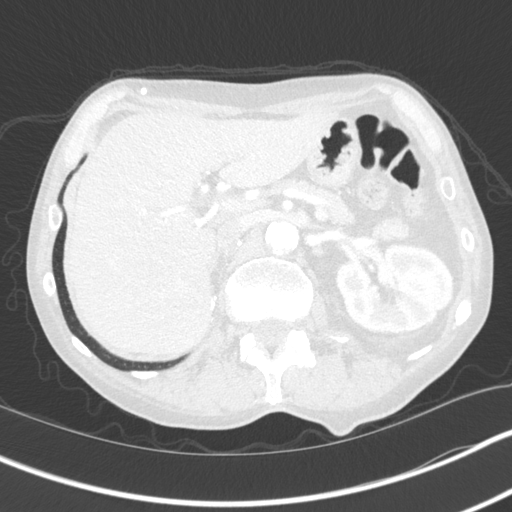
[im 48/214  lung]
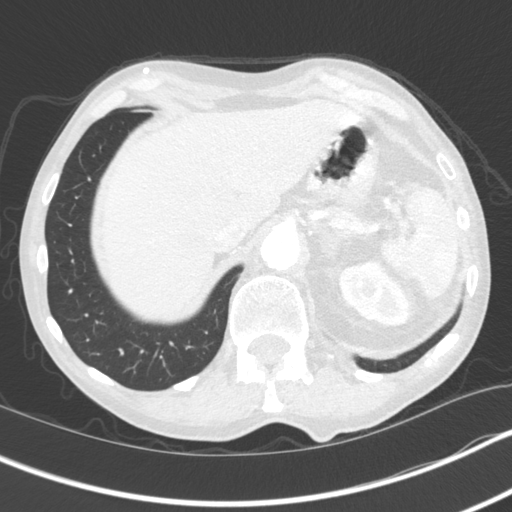
[im 72/214  lung]
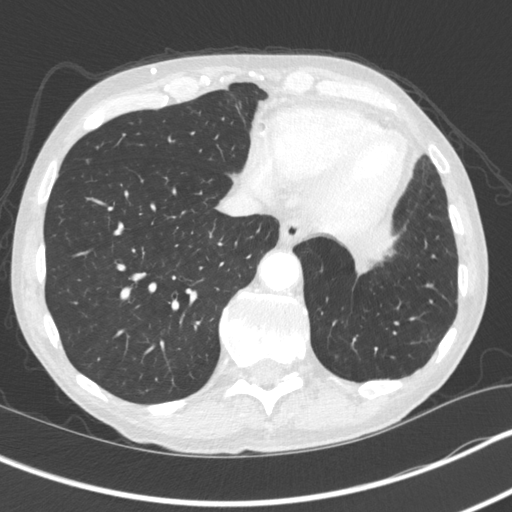
[im 87/214  mediastinal]
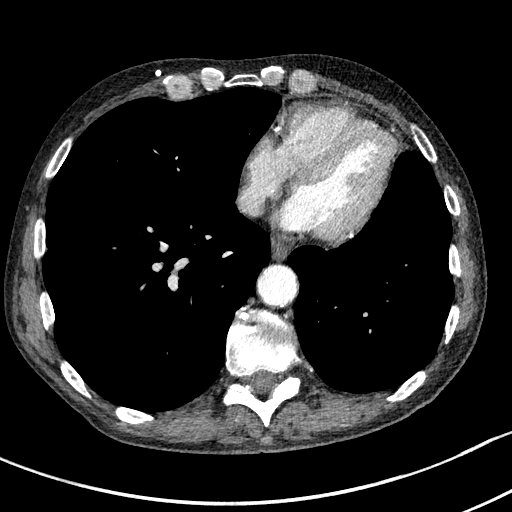
[im 87/214  lung]
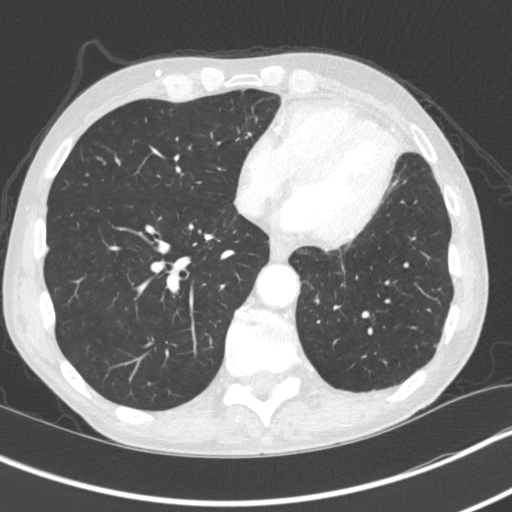
[im 111/214  lung]
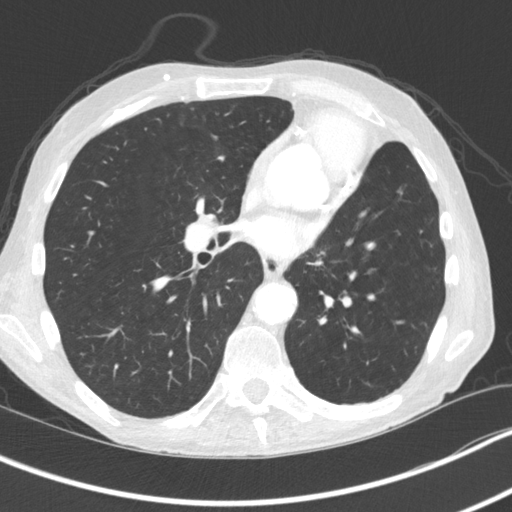
[im 127/214  lung]
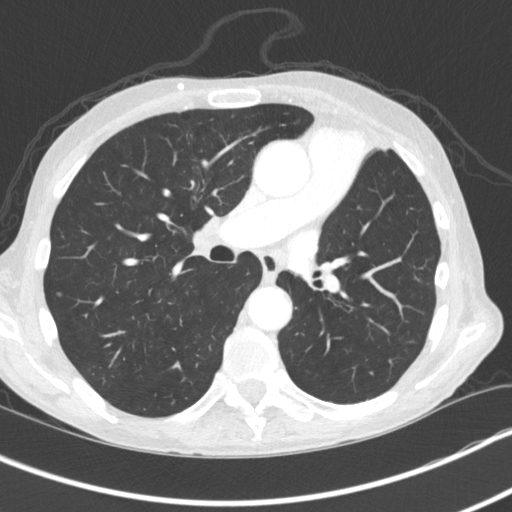
[im 143/214  lung]
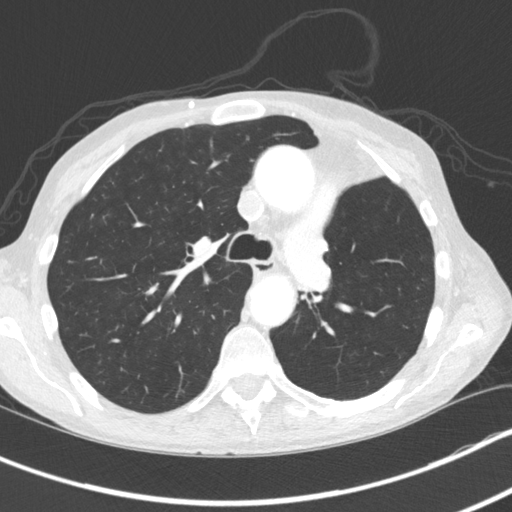
[im 166/214  mediastinal]
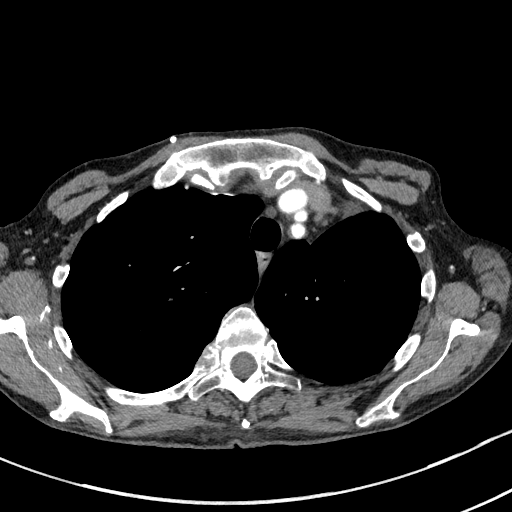
[im 166/214  lung]
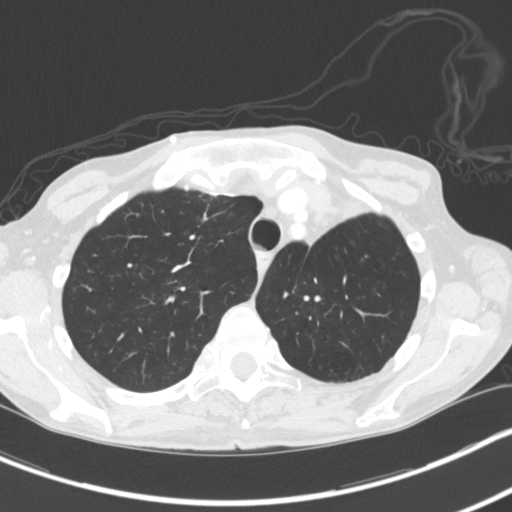
[im 182/214  lung]
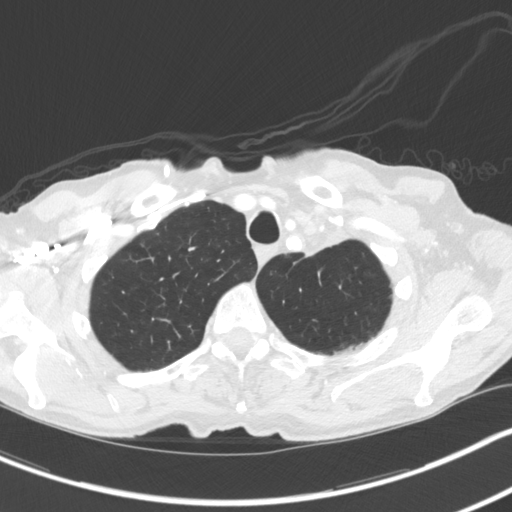
[im 198/214  lung]
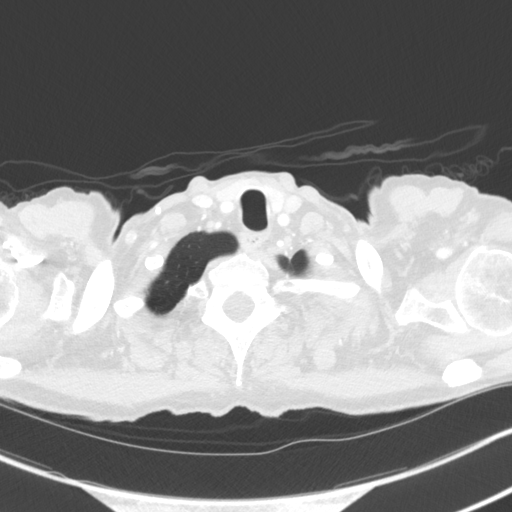

[Series 5: coronal · coronal · 0.61mm/px · 3 of 122 slices shown]
[im 25/122  lung]
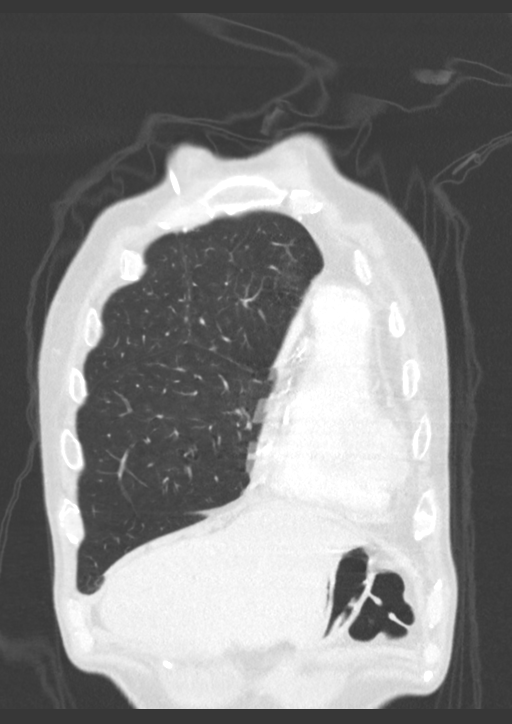
[im 49/122  lung]
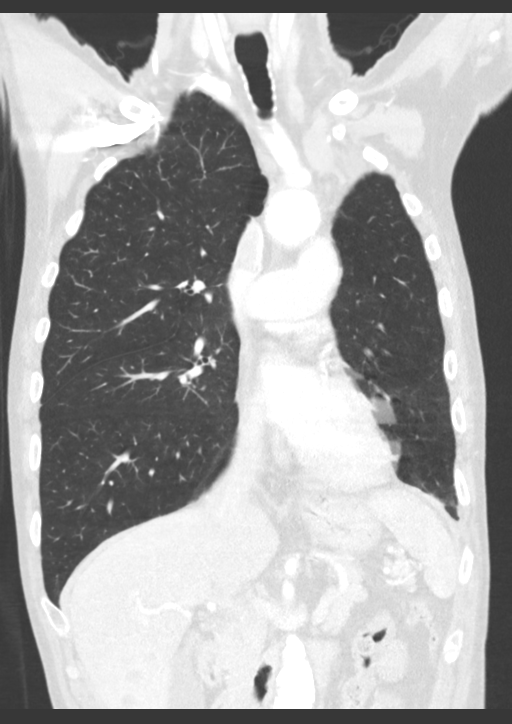
[im 73/122  lung]
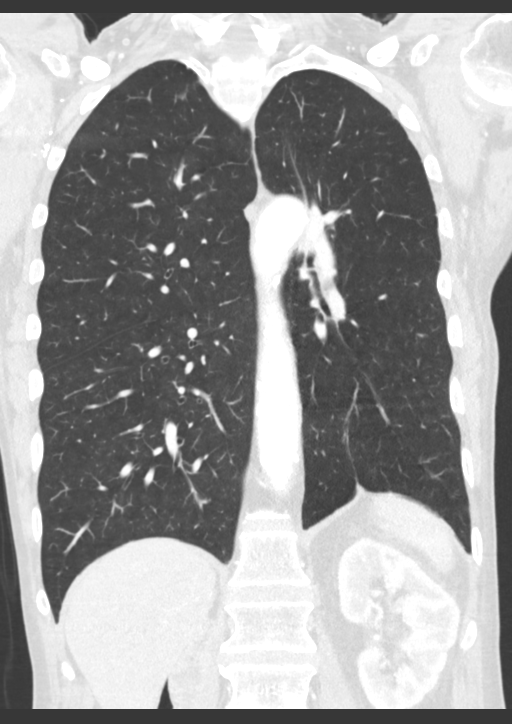

[14 of 36 positions shown; findings below may reference images not displayed]

FINDINGS: Cardiovascular: Atherosclerotic calcification of the arterial
vasculature, including coronary arteries. Heart size normal. No
pericardial effusion.

Mediastinum/Nodes: Mediastinal and hilar lymph nodes are not
enlarged by CT size criteria. No axillary adenopathy. Esophagus is
grossly unremarkable.

Lungs/Pleura: Mild scattered pulmonary parenchymal scarring.
Postoperative changes of left upper lobectomy. 3 mm medial left
lower lobe nodule (series 3, image 142) appears new. Additional
scattered pulmonary nodules measure up to 4 mm in the right lower
lobe, as before. No pleural fluid. Airway is otherwise unremarkable.

Upper Abdomen: There are low-attenuation lesions in the right
hepatic lobe with the largest cluster seen inferiorly, measuring
approximately 4.0 x 4.1 cm. Visualized portion of the gallbladder is
unremarkable. Right adrenalectomy and right nephrectomy. Left
adrenal adenoma, as on 06/30/2017. Visualized portions of the left
kidney, spleen, pancreas, stomach and bowel are otherwise grossly
unremarkable. Retroperitoneal lymph nodes are not enlarged by CT
size criteria.

Musculoskeletal: Pathologic fracture of the posterior left eleventh
rib. Lucent lesion in the left posterolateral ninth rib. Old
bilateral rib fractures. There is an expansile lesion in the right
fifth rib, at the costovertebral junction.
IMPRESSION: 1. New 3 mm medial left lower lobe nodule, nonspecific. Additional
bilateral pulmonary nodules are unchanged.
2. Osseous metastatic disease, as before, with a pathologic fracture
of the posterior left eleventh rib.
3. Low-attenuation lesions in the liver, as before, highly worrisome
for metastatic disease.
4. Aortic atherosclerosis ([id number]-170.0). Coronary artery
calcification.
5. Left adrenal adenoma.
